# Patient Record
Sex: Female | Born: 1955 | Race: Black or African American | Hispanic: No | Marital: Married | Smoking: Former smoker
Health system: Southern US, Community
[De-identification: ages and names within clinical notes are randomized; demographics above are authoritative.]

## PROBLEM LIST (undated history)

## (undated) DIAGNOSIS — I10 Essential (primary) hypertension: Secondary | ICD-10-CM

## (undated) DIAGNOSIS — K449 Diaphragmatic hernia without obstruction or gangrene: Secondary | ICD-10-CM

## (undated) DIAGNOSIS — J989 Respiratory disorder, unspecified: Secondary | ICD-10-CM

## (undated) DIAGNOSIS — J9600 Acute respiratory failure, unspecified whether with hypoxia or hypercapnia: Secondary | ICD-10-CM

## (undated) DIAGNOSIS — K219 Gastro-esophageal reflux disease without esophagitis: Secondary | ICD-10-CM

## (undated) DIAGNOSIS — E785 Hyperlipidemia, unspecified: Secondary | ICD-10-CM

## (undated) DIAGNOSIS — Z9911 Dependence on respirator [ventilator] status: Secondary | ICD-10-CM

## (undated) DIAGNOSIS — J449 Chronic obstructive pulmonary disease, unspecified: Secondary | ICD-10-CM

## (undated) DIAGNOSIS — R0602 Shortness of breath: Secondary | ICD-10-CM

## (undated) HISTORY — PX: CHOLECYSTECTOMY: SHX55

## (undated) HISTORY — PX: TRACHEOSTOMY: SUR1362

---

## 2005-11-14 ENCOUNTER — Ambulatory Visit: Payer: Self-pay | Admitting: Cardiology

## 2006-11-13 ENCOUNTER — Encounter: Payer: Self-pay | Admitting: Cardiology

## 2006-12-02 ENCOUNTER — Ambulatory Visit: Payer: Self-pay | Admitting: Cardiology

## 2006-12-30 ENCOUNTER — Encounter: Payer: Self-pay | Admitting: Cardiology

## 2007-01-02 ENCOUNTER — Ambulatory Visit: Payer: Self-pay | Admitting: Cardiology

## 2007-02-26 ENCOUNTER — Encounter: Payer: Self-pay | Admitting: Cardiology

## 2007-03-05 ENCOUNTER — Ambulatory Visit: Payer: Self-pay | Admitting: Cardiology

## 2009-06-22 ENCOUNTER — Encounter (INDEPENDENT_AMBULATORY_CARE_PROVIDER_SITE_OTHER): Payer: Self-pay | Admitting: *Deleted

## 2009-06-22 DIAGNOSIS — R079 Chest pain, unspecified: Secondary | ICD-10-CM

## 2009-09-11 ENCOUNTER — Encounter: Payer: Self-pay | Admitting: Cardiology

## 2011-03-12 NOTE — Assessment & Plan Note (Signed)
[facility]                          [facility]   WANONA, STARE                      MRN:          [id number]  DATE:03/05/2007                            DOB:          1956-02-10    REFERRING PHYSICIAN:  Dr. Linna Darner   HISTORY OF PRESENT ILLNESS:  The patient is a 55 year old African  American female, employee at [employer].  The patient has a prior  history of substernal chest pain which has been felt to be related to a  knife.  She felt that her chest pain was atypical.  The patient states  that since she switched to Fosamax, she had one single more episodes of  chest pain.  She took nitroglycerin, but this did not improve her  symptoms.  Mylanta, however, did.  She talked to Dr. Linna Darner about this,  and they both felt that the patient has noncardiac chest pain.  She has  actually done quite well at this point in time. We also started her on  Simvastatin 20 mg daily, and her LDL was now down to 75.  Her HDL was  100 and 19.  Total cholesterol was 212.   MEDICATIONS:  1. Spiriva inhaler.  2. Advair 5/50 one puff b.i.d.  3. Protonix 40 mg daily.  4. Actonel 35 mg p.o. weekly.  5. Valtrex 500 mg daily.  6. Cortisone shots.  7. Calcium.  8. Aspirin 325.  9. Simvastatin 20 mg p.o. q.h.s.   PHYSICAL EXAMINATION:  VITAL SIGNS:  Blood pressure 114/77, heart rate  102 beats per minute.  NECK:  Normal carotid upstroke.  No carotid bruits.  LUNGS:  Clear.  HEART:  Regular rate and rhythm.  EXTREMITIES:  No edema.   PROBLEMS:  1. Atypical chest pain after use of Boniva.  2. Abnormal cardiology study with basilar inferior defect.  3. Mild LV dysfunction with ejection fraction of 49% by Cardiolite and      echocardiogram.  4. Asthma/chronic obstructive pulmonary disease.  5. Gastroesophageal reflux disease.   PLAN:  1. The patient has had no symptoms consistent with angina.  She will      follow up with [place] on a p.r.n.  basis.  No further cardiac workup      appears to be indicated currently.  2. We did cut the patient's Simvastatin to 10 mg p.o. daily given her      excellent lipid profile panel.  3. The patient was also given, during the last visit, p.r.n.      nitroglycerin which she will keep on hand.  She will also continue      on aspirin 325 mg p.o. daily.     Learta Codding, MD,FACC     GED/MedQ  DD: 03/05/2007  DT: 03/05/2007  Job #: [id number]   cc:   Dr. Linna Darner

## 2011-03-15 NOTE — Assessment & Plan Note (Signed)
[facility]                          [facility]   PAISLYNN, HEGSTROM                      MRN:          [id number]  DATE:01/02/2007                            DOB:          10-14-1956    REFERRING PHYSICIAN:  Linna Darner, M.D.   HISTORY OF PRESENT ILLNESS:  The patient is a 55 year old African  American female who presented to the emergency room on November 13, 2006  with substernal chest pain.  The patient reports that she had taken  Boniva a couple of days ago.  A couple of hours after she had took the  dose she developed substernal chest pain.  The pain seemed to be more in  the epigastric radiating up into the chest.  She felt that the entire  chest area was hurting.  The patient apparently was rather anxious at  that time.  She was ruled out for myocardial infarction.  She has  stopped since taking Boniva.  She denies any exertional chest pain or  exertional shortness of breath.  She has no orthopnea, PND.  She does  report a history of asthma.  The patient is rather active.  Unfortunately, she does have risk factors for coronary artery disease.  In particular, the patient was a former smoker but stopped smoking  several years ago in 2006.   Following the evaluation for chest pain, the patient was referred for  Cardiolite stress as well as an echocardiographic study.  Ejection  fraction demonstrated an ejection fraction of 45-50% with mild global  hypokinesis in the posterior wall that was hypokinetic.  A Cardiolite  study was consistent with the findings on echocardiogram with an  ejection fraction of 49% and a basilar inferior defect that was  nonreversible.  There was, however, no definite ischemia seen.   ALLERGIES:  1. CODEINE.  2. MORPHINE.   MEDICATIONS:  1. Spiriva inhaler daily.  2. Advair 500/50 1 puff b.i.d.  3. Protonix 40 mg daily.  4. Actonel 35 mg p.o. daily.  5. Valtrex 500 mg p.o. daily.  6. Cortisone.  7. Calcium 500 mg p.o. b.i.d.   SOCIAL HISTORY:  The patient was a former smoker.  She does not drink  alcohol.  She is an International aid/development worker at clerical job.   PAST SURGICAL HISTORY:  Cholecystectomy.   FAMILY HISTORY:  Mother had a history of heart disease and has been  deceased.  Father had throat cancer.   REVIEW OF SYSTEMS:  As per HPI.  No fever or chills.  No cough or  congestion.  Occasional palpitations and complaints of pain in the lower  extremities.  No heartburn or bloating.  No dysuria or frequency.  No  cramps.  No weakness or tremor.  Positive for anxiety.  No melena,  hematochezia.  No dysuria, frequency.  The remainder of the review of  systems is negative.   PHYSICAL EXAMINATION:  VITAL SIGNS:  Blood pressure is 120/77, heart  rate is 95 beats per minute, weight is 153 pounds.  NECK EXAM:  Normal carotid upstrokes.  No carotid bruits.  LUNGS:  Clear  breath sounds bilaterally.  HEART:  Regular rate and rhythm.  Normal S1-S2.  No murmurs, rubs or  gallops.  ABDOMEN:  Soft.  EXTREMITIES:  No clubbing, cyanosis, or edema.  NEUROLOGICAL:  The patient is alert, oriented and grossly nonfocal.   PROBLEM LIST:  1. Atypical chest pain after use of Boniva.  2. Ruled out for myocardial infarction on November 13, 2006.  3. Abnormal Cardiolite study with basilar inferior defect.  4. Mild aortic dysfunction with ejection fraction of 49%.  5. Asthma/chronic obstructive pulmonary disease.  6. Gastroesophageal reflux disease.   PLAN:  1. The patient's chest pain is atypical.  She has no real exertional      symptoms.  2. The patient, given her risk factor profile, likely has coronary      artery disease and based also on her Cardiolite stress study.      However, she has no definite ischemia.  3. I have given the patient the options to proceed with cardiac      catheterization, although I think she is currently at no risk due      to absence of symptoms.  The patient at this  point wants to defer      and I told her that we will continue to treat her medically.      Initially, she will need to take an aspirin a day.  4. The patient will follow up with me in the future to make sure that      she does not have any recurrent symptoms at which time      catheterization may be performed.     Learta Codding, MD,FACC  Electronically Signed    GED/MedQ  DD: 01/04/2007  DT: 01/04/2007  Job #: [id number]   cc:   Linna Darner, M.D.

## 2013-03-28 ENCOUNTER — Encounter (INDEPENDENT_AMBULATORY_CARE_PROVIDER_SITE_OTHER): Payer: Self-pay | Admitting: Internal Medicine

## 2013-03-29 NOTE — Telephone Encounter (Signed)
This encounter was created in error - please disregard.

## 2014-05-28 ENCOUNTER — Inpatient Hospital Stay (HOSPITAL_COMMUNITY): Payer: BC Managed Care – PPO

## 2014-05-28 ENCOUNTER — Inpatient Hospital Stay (HOSPITAL_COMMUNITY)
Admission: AD | Admit: 2014-05-28 | Discharge: 2014-06-03 | DRG: 208 | Disposition: A | Discharge status: Skilled Nursing Facility | Payer: BC Managed Care – PPO | Source: Other Acute Inpatient Hospital | Attending: Internal Medicine | Admitting: Internal Medicine

## 2014-05-28 DIAGNOSIS — I10 Essential (primary) hypertension: Secondary | ICD-10-CM | POA: Diagnosis present

## 2014-05-28 DIAGNOSIS — Z87891 Personal history of nicotine dependence: Secondary | ICD-10-CM

## 2014-05-28 DIAGNOSIS — R0689 Other abnormalities of breathing: Secondary | ICD-10-CM

## 2014-05-28 DIAGNOSIS — J449 Chronic obstructive pulmonary disease, unspecified: Secondary | ICD-10-CM

## 2014-05-28 DIAGNOSIS — J441 Chronic obstructive pulmonary disease with (acute) exacerbation: Principal | ICD-10-CM | POA: Diagnosis present

## 2014-05-28 DIAGNOSIS — Z9981 Dependence on supplemental oxygen: Secondary | ICD-10-CM

## 2014-05-28 DIAGNOSIS — G4733 Obstructive sleep apnea (adult) (pediatric): Secondary | ICD-10-CM | POA: Diagnosis present

## 2014-05-28 DIAGNOSIS — IMO0002 Reserved for concepts with insufficient information to code with codable children: Secondary | ICD-10-CM

## 2014-05-28 DIAGNOSIS — M810 Age-related osteoporosis without current pathological fracture: Secondary | ICD-10-CM | POA: Diagnosis present

## 2014-05-28 DIAGNOSIS — R7309 Other abnormal glucose: Secondary | ICD-10-CM | POA: Diagnosis present

## 2014-05-28 DIAGNOSIS — E43 Unspecified severe protein-calorie malnutrition: Secondary | ICD-10-CM

## 2014-05-28 DIAGNOSIS — R079 Chest pain, unspecified: Secondary | ICD-10-CM

## 2014-05-28 DIAGNOSIS — E874 Mixed disorder of acid-base balance: Secondary | ICD-10-CM | POA: Diagnosis present

## 2014-05-28 DIAGNOSIS — R0989 Other specified symptoms and signs involving the circulatory and respiratory systems: Secondary | ICD-10-CM

## 2014-05-28 DIAGNOSIS — R627 Adult failure to thrive: Secondary | ICD-10-CM | POA: Diagnosis present

## 2014-05-28 DIAGNOSIS — D649 Anemia, unspecified: Secondary | ICD-10-CM | POA: Diagnosis present

## 2014-05-28 DIAGNOSIS — T380X5A Adverse effect of glucocorticoids and synthetic analogues, initial encounter: Secondary | ICD-10-CM | POA: Diagnosis present

## 2014-05-28 DIAGNOSIS — F411 Generalized anxiety disorder: Secondary | ICD-10-CM | POA: Diagnosis present

## 2014-05-28 DIAGNOSIS — J9620 Acute and chronic respiratory failure, unspecified whether with hypoxia or hypercapnia: Secondary | ICD-10-CM | POA: Diagnosis present

## 2014-05-28 DIAGNOSIS — K449 Diaphragmatic hernia without obstruction or gangrene: Secondary | ICD-10-CM

## 2014-05-28 DIAGNOSIS — J9622 Acute and chronic respiratory failure with hypercapnia: Secondary | ICD-10-CM

## 2014-05-28 DIAGNOSIS — R0609 Other forms of dyspnea: Secondary | ICD-10-CM

## 2014-05-28 LAB — URINALYSIS, ROUTINE W REFLEX MICROSCOPIC
BILIRUBIN URINE: NEGATIVE
GLUCOSE, UA: NEGATIVE mg/dL
HGB URINE DIPSTICK: NEGATIVE
Ketones, ur: NEGATIVE mg/dL
Leukocytes, UA: NEGATIVE
Nitrite: NEGATIVE
PH: 7.5 (ref 5.0–8.0)
Protein, ur: NEGATIVE mg/dL
SPECIFIC GRAVITY, URINE: 1.016 (ref 1.005–1.030)
Urobilinogen, UA: 0.2 mg/dL (ref 0.0–1.0)

## 2014-05-28 LAB — COMPREHENSIVE METABOLIC PANEL
ALT: 45 U/L — ABNORMAL HIGH (ref 0–35)
ANION GAP: 5 (ref 5–15)
AST: 27 U/L (ref 0–37)
Albumin: 2.8 g/dL — ABNORMAL LOW (ref 3.5–5.2)
Alkaline Phosphatase: 48 U/L (ref 39–117)
BILIRUBIN TOTAL: 0.4 mg/dL (ref 0.3–1.2)
BUN: 15 mg/dL (ref 6–23)
CALCIUM: 8.5 mg/dL (ref 8.4–10.5)
CO2: 32 mEq/L (ref 19–32)
Chloride: 102 mEq/L (ref 96–112)
Creatinine, Ser: 0.48 mg/dL — ABNORMAL LOW (ref 0.50–1.10)
GFR calc Af Amer: >90 mL/min (ref >90)
GFR calc non Af Amer: >90 mL/min (ref >90)
Glucose, Bld: 127 mg/dL — ABNORMAL HIGH (ref 70–99)
Potassium: 4.2 mEq/L (ref 3.7–5.3)
Sodium: 139 mEq/L (ref 137–147)
Total Protein: 5.2 g/dL — ABNORMAL LOW (ref 6.0–8.3)

## 2014-05-28 LAB — POCT I-STAT 3, ART BLOOD GAS (G3+)
Acid-Base Excess: 7 mmol/L — ABNORMAL HIGH (ref 0.0–2.0)
Bicarbonate: 31.8 mEq/L — ABNORMAL HIGH (ref 20.0–24.0)
O2 Saturation: 99 %
PH ART: 7.453 — AB (ref 7.350–7.450)
Patient temperature: 98.9
TCO2: 33 mmol/L (ref 0–100)
pCO2 arterial: 45.5 mmHg — ABNORMAL HIGH (ref 35.0–45.0)
pO2, Arterial: 150 mmHg — ABNORMAL HIGH (ref 80.0–100.0)

## 2014-05-28 LAB — CORTISOL: Cortisol, Plasma: 5.4 ug/dL

## 2014-05-28 LAB — PHOSPHORUS: Phosphorus: 0.8 mg/dL — CL (ref 2.3–4.6)

## 2014-05-28 LAB — CBC
HCT: 35.2 % — ABNORMAL LOW (ref 36.0–46.0)
HEMOGLOBIN: 10.1 g/dL — AB (ref 12.0–15.0)
MCH: 29.8 pg (ref 26.0–34.0)
MCHC: 28.7 g/dL — AB (ref 30.0–36.0)
MCV: 103.8 fL — ABNORMAL HIGH (ref 78.0–100.0)
Platelets: 269 10*3/uL (ref 150–400)
RBC: 3.39 MIL/uL — ABNORMAL LOW (ref 3.87–5.11)
RDW: 14.3 % (ref 11.5–15.5)
WBC: 17.1 10*3/uL — ABNORMAL HIGH (ref 4.0–10.5)

## 2014-05-28 LAB — PROTIME-INR
INR: 1.04 (ref 0.00–1.49)
Prothrombin Time: 13.6 seconds (ref 11.6–15.2)

## 2014-05-28 LAB — LACTIC ACID, PLASMA: Lactic Acid, Venous: 2.5 mmol/L — ABNORMAL HIGH (ref 0.5–2.2)

## 2014-05-28 LAB — PROCALCITONIN

## 2014-05-28 LAB — MRSA PCR SCREENING: MRSA by PCR: NEGATIVE

## 2014-05-28 LAB — APTT: aPTT: 21 seconds — ABNORMAL LOW (ref 24–37)

## 2014-05-28 LAB — STREP PNEUMONIAE URINARY ANTIGEN: Strep Pneumo Urinary Antigen: NEGATIVE

## 2014-05-28 LAB — GLUCOSE, CAPILLARY: GLUCOSE-CAPILLARY: 112 mg/dL — AB (ref 70–99)

## 2014-05-28 LAB — MAGNESIUM: MAGNESIUM: 2.3 mg/dL (ref 1.5–2.5)

## 2014-05-28 MED ORDER — IPRATROPIUM BROMIDE 0.02 % IN SOLN
0.5000 mg | Freq: Four times a day (QID) | RESPIRATORY_TRACT | Status: DC
  Administered 2014-05-28 – 2014-05-29 (×3): 0.5 mg via RESPIRATORY_TRACT
  Filled 2014-05-28 (×3): qty 2.5

## 2014-05-28 MED ORDER — PANTOPRAZOLE SODIUM 40 MG PO PACK
40.0000 mg | PACK | Freq: Every day | ORAL | Status: DC
  Administered 2014-05-28 – 2014-05-31 (×4): 40 mg
  Filled 2014-05-28 (×5): qty 20

## 2014-05-28 MED ORDER — JEVITY 1.2 CAL PO LIQD
1000.0000 mL | ORAL | Status: DC
  Administered 2014-05-28: 1000 mL
  Filled 2014-05-28 (×2): qty 1000

## 2014-05-28 MED ORDER — LEVALBUTEROL HCL 0.63 MG/3ML IN NEBU
0.6300 mg | INHALATION_SOLUTION | Freq: Four times a day (QID) | RESPIRATORY_TRACT | Status: DC
  Administered 2014-05-28 – 2014-06-01 (×15): 0.63 mg via RESPIRATORY_TRACT
  Filled 2014-05-28 (×31): qty 3

## 2014-05-28 MED ORDER — PROPOFOL 10 MG/ML IV EMUL: 5.0000 ug/kg/min | INTRAVENOUS | Status: DC

## 2014-05-28 MED ORDER — CETYLPYRIDINIUM CHLORIDE 0.05 % MT LIQD
7.0000 mL | Freq: Four times a day (QID) | OROMUCOSAL | Status: DC
  Administered 2014-05-28 – 2014-05-29 (×4): 7 mL via OROMUCOSAL

## 2014-05-28 MED ORDER — FENTANYL CITRATE 0.05 MG/ML IJ SOLN
25.0000 ug | INTRAMUSCULAR | Status: DC | PRN
  Administered 2014-05-28 – 2014-05-30 (×3): 100 ug via INTRAVENOUS
  Filled 2014-05-28 (×3): qty 2

## 2014-05-28 MED ORDER — HEPARIN SODIUM (PORCINE) 5000 UNIT/ML IJ SOLN
5000.0000 [IU] | Freq: Three times a day (TID) | INTRAMUSCULAR | Status: DC
  Administered 2014-05-28 – 2014-06-03 (×16): 5000 [IU] via SUBCUTANEOUS
  Filled 2014-05-28 (×20): qty 1

## 2014-05-28 MED ORDER — METHYLPREDNISOLONE SODIUM SUCC 125 MG IJ SOLR
60.0000 mg | Freq: Three times a day (TID) | INTRAMUSCULAR | Status: DC
  Administered 2014-05-28 – 2014-05-30 (×6): 60 mg via INTRAVENOUS
  Filled 2014-05-28 (×9): qty 0.96

## 2014-05-28 MED ORDER — "THROMBI-PAD 3""X3"" EX PADS"
1.0000 | MEDICATED_PAD | Freq: Once | CUTANEOUS | Status: DC
  Filled 2014-05-28: qty 1

## 2014-05-28 MED ORDER — PANTOPRAZOLE SODIUM 40 MG IV SOLR
40.0000 mg | Freq: Every day | INTRAVENOUS | Status: DC
  Filled 2014-05-28: qty 40

## 2014-05-28 MED ORDER — SODIUM CHLORIDE 0.9 % IV SOLN
INTRAVENOUS | Status: DC
  Administered 2014-05-28: 15:00:00 via INTRAVENOUS

## 2014-05-28 MED ORDER — CHLORHEXIDINE GLUCONATE 0.12 % MT SOLN
15.0000 mL | Freq: Two times a day (BID) | OROMUCOSAL | Status: DC
Start: 2014-05-28 — End: 2014-05-31
  Administered 2014-05-28 – 2014-05-31 (×6): 15 mL via OROMUCOSAL
  Filled 2014-05-28 (×6): qty 15

## 2014-05-28 MED ORDER — K PHOS MONO-SOD PHOS DI & MONO 155-852-130 MG PO TABS
500.0000 mg | ORAL_TABLET | Freq: Three times a day (TID) | ORAL | Status: AC
  Administered 2014-05-28 – 2014-05-30 (×6): 500 mg via ORAL
  Filled 2014-05-28 (×6): qty 2

## 2014-05-28 NOTE — Progress Notes (Signed)
UR Completed.  Ketty Bitton Jane [phone] 05/28/2014  

## 2014-05-28 NOTE — H&P (Signed)
PULMONARY / CRITICAL CARE MEDICINE   Name: Elizabeth [name] MRN: [id number] DOB: [birth year]    ADMISSION DATE:  05/28/2014   REFERRING MD : [facility](Dr. Orson Aloe is her pulmonary MD.)  CHIEF COMPLAINT:  FTT with es copd  INITIAL PRESENTATION: Sedated on full mvs without distress.  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 Admitted and proved refractory to NIMVS, steroids, abx and at request of family was transferred to [facility] hospital. She arrives intubated , sedated and in no acute distress. PCCM will admit to our service and she may need tracheostomy and LTAC vs palliation if she proved refractory to current interventions. Pulm - Orson Aloe, was on BiPAP at home  X 3y, pressure decreased to 14/9 since could not tolerate high pressure  PAST MEDICAL HISTORY :  ESCOPD HTN Osteoporosis Hiatal herina   Prior to Admission medications   reviewed   Allergies  Allergen Reactions  . Codeine   . Morphine     FAMILY HISTORY:  REVIEWED SOCIAL HISTORY: Quit smoking 7 years ago. Married  REVIEW OF SYSTEMS:  Unable to obatin  SUBJECTIVE:   VITAL SIGNS:   HEMODYNAMICS:   VENTILATOR SETTINGS:   INTAKE / OUTPUT: No intake or output data in the 24 hours ending 05/28/14 1355  PHYSICAL EXAMINATION: General:  WF sedated on vent Neuro:  Sedated with diprivan. No arousal to voice HEENT:  OTT-> vent Cardiovascular:  HSR RRR Lungs: Decreased bs BL, peak pr 39 on Tv 550, plat 24 Abdomen:  Soft NT + bs Musculoskeletal:  Intact Skin:  Warm and without edema  LABS:  CBC No results found for this basename: WBC, HGB, HCT, PLT,  in the last 168 hours Coag's No results found for this basename: APTT, INR,  in the last 168 hours BMET No results found for this  basename: NA, K, CL, CO2, BUN, CREATININE, GLUCOSE,  in the last 168 hours Electrolytes No results found for this basename: CALCIUM, MG, PHOS,  in the last 168 hours Sepsis Markers No results found for this basename: LATICACIDVEN, PROCALCITON, O2SATVEN,  in the last 168 hours ABG No results found for this basename: PHART, PCO2ART, PO2ART,  in the last 168 hours Liver Enzymes No results found for this basename: AST, ALT, ALKPHOS, BILITOT, ALBUMIN,  in the last 168 hours Cardiac Enzymes No results found for this basename: TROPONINI, PROBNP,  in the last 168 hours Glucose No results found for this basename: GLUCAP,  in the last 168 hours  Imaging No results found.   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 P:   Vent bundle Check CxR, ABG and adjust vent as needed She may need EOL discussion vs tracheostomy and Ltac  Solumedrol 60 q 8h Hold abx for now BD's as needed  CARDIOVASCULAR CVL A:  HTN P:  Antihypertensives as needed  RENAL A:   Metabolic acidosis secondary to treatment of resp acidosis P:   Check abg and adjust as needed   GASTROINTESTINAL A:   HH GI protection P:   PPI Start Tfs  HEMATOLOGIC A:   No acute process P:    INFECTIOUS A:  Presumed pulmonary infection without clinical support P:   BCx2 8/1>> UC 8/1>> Sputum 8/1>> Abx: , start date, day / Hold abx for now   ENDOCRINE A:   Hyperglycemia from presumed steroid use  P:   SSI  NEUROLOGIC A:   Sedated on vent and with AMS prior to [facility] with hypercarbia. On diprivan drip. P:   RASS goal: -1 Propofol gtt, fent prn WUA daily   TODAY'S SUMMARY:  End stage COPD [facility] for hypercarbic respiratory failure with intubation. Will assess weaning potential over next 2-3 days   Brett CanalesSteve Minor  ACNP [facility] Pager [phone] till 3 pm If no answer page [phone] 05/28/2014, 2:20 PM  I have personally obtained a history, examined the patient, evaluated laboratory and imaging results, formulated the assessment and plan and placed orders. CRITICAL CARE: The patient is critically ill with multiple organ systems failure and requires high complexity decision making for assessment and support, frequent evaluation and titration of therapies, application of advanced monitoring technologies and extensive interpretation of multiple databases. Critical Care Time devoted to patient care services described in this note is 50 minutes.    Cyril Mourningakesh Alva MD. Tonny BollmanFCCP. [place] [facility] Pager [phone] If no response call [phone]    05/28/2014, 1:55 PM

## 2014-05-28 NOTE — Progress Notes (Signed)
eLink Physician-Brief Progress Note Patient Name: Elizabeth [name] DOB: 02-17-1956 MRN: [id number]  Date of Service  05/28/2014   HPI/Events of Note   Low phosphorous  eICU Interventions  KPhos replacement orally tid x2 days now Monitor Phos   Intervention Category Minor Interventions: Electrolytes abnormality - evaluation and management  MCQUAID, DOUGLAS 05/28/2014, 4:43 PM

## 2014-05-29 LAB — CBC
HCT: 30.6 % — ABNORMAL LOW (ref 36.0–46.0)
Hemoglobin: 9 g/dL — ABNORMAL LOW (ref 12.0–15.0)
MCH: 28.8 pg (ref 26.0–34.0)
MCHC: 29.4 g/dL — AB (ref 30.0–36.0)
MCV: 97.8 fL (ref 78.0–100.0)
Platelets: 310 10*3/uL (ref 150–400)
RBC: 3.13 MIL/uL — ABNORMAL LOW (ref 3.87–5.11)
RDW: 14.7 % (ref 11.5–15.5)
WBC: 13.9 10*3/uL — AB (ref 4.0–10.5)

## 2014-05-29 LAB — BASIC METABOLIC PANEL
Anion gap: 10 (ref 5–15)
BUN: 19 mg/dL (ref 6–23)
CHLORIDE: 105 meq/L (ref 96–112)
CO2: 27 mEq/L (ref 19–32)
CREATININE: 0.47 mg/dL — AB (ref 0.50–1.10)
Calcium: 8.2 mg/dL — ABNORMAL LOW (ref 8.4–10.5)
Glucose, Bld: 149 mg/dL — ABNORMAL HIGH (ref 70–99)
Potassium: 4.1 mEq/L (ref 3.7–5.3)
Sodium: 142 mEq/L (ref 137–147)

## 2014-05-29 LAB — URINE CULTURE
CULTURE: NO GROWTH
Colony Count: NO GROWTH

## 2014-05-29 LAB — BLOOD GAS, ARTERIAL
Acid-Base Excess: 5.8 mmol/L — ABNORMAL HIGH (ref 0.0–2.0)
BICARBONATE: 29.5 meq/L — AB (ref 20.0–24.0)
Drawn by: 36989
FIO2: 0.6 %
MECHVT: 550 mL
O2 Saturation: 99.7 %
PATIENT TEMPERATURE: 98.6
PEEP: 5 cmH2O
PH ART: 7.473 — AB (ref 7.350–7.450)
PO2 ART: 164 mmHg — AB (ref 80.0–100.0)
RATE: 10 resp/min
TCO2: 30.8 mmol/L (ref 0–100)
pCO2 arterial: 40.8 mmHg (ref 35.0–45.0)

## 2014-05-29 LAB — PHOSPHORUS: PHOSPHORUS: 1.7 mg/dL — AB (ref 2.3–4.6)

## 2014-05-29 MED ORDER — CETYLPYRIDINIUM CHLORIDE 0.05 % MT LIQD
7.0000 mL | Freq: Four times a day (QID) | OROMUCOSAL | Status: DC
  Administered 2014-05-29 – 2014-05-31 (×7): 7 mL via OROMUCOSAL

## 2014-05-29 MED ORDER — IPRATROPIUM BROMIDE 0.02 % IN SOLN
0.5000 mg | Freq: Four times a day (QID) | RESPIRATORY_TRACT | Status: AC
  Administered 2014-05-29 – 2014-06-01 (×14): 0.5 mg via RESPIRATORY_TRACT
  Filled 2014-05-29 (×15): qty 2.5

## 2014-05-29 MED ORDER — JEVITY 1.2 CAL PO LIQD
1000.0000 mL | ORAL | Status: DC
  Administered 2014-05-30: 1000 mL
  Filled 2014-05-29 (×3): qty 1000

## 2014-05-29 NOTE — Progress Notes (Signed)
ABG    Component Value Date/Time   PHART 7.473* 05/29/2014 0438   PCO2ART 40.8 05/29/2014 0438   PO2ART 164.0* 05/29/2014 0438   HCO3 29.5* 05/29/2014 0438   TCO2 30.8 05/29/2014 0438   O2SAT 99.7 05/29/2014 0438

## 2014-05-29 NOTE — Progress Notes (Signed)
PULMONARY / CRITICAL CARE MEDICINE   Name: Elizabeth [name] MRN: [id number] DOB: 1956-01-30    ADMISSION DATE:  05/28/2014   REFERRING MD : [facility](Dr. 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 Admitted and proved refractory to NIMVS, steroids, abx and at request of family was transferred to [facility] hospital. She arrives intubated , sedated and in no acute distress. PCCM will admit to our service and she may need tracheostomy and LTAC vs palliation if she proved refractory to current interventions.  Pulm - Orson Aloe, was on BiPAP at home X 3y, pressure decreased to 14/9 since could not tolerate high pressure   STUDIES:   SIGNIFICANT EVENTS: 8/1 [facility]   SUBJECTIVE: denies CP, c/o dyspnea afebrile  VITAL SIGNS: Temp:  [98.6 F (37 C)-100.7 F (38.2 C)] 98.8 F (37.1 C) (08/02 0800) Pulse Rate:  [89-133] 110 (08/02 0945) Resp:  [8-27] 16 (08/02 0945) BP: (82-159)/(56-123) 125/72 mmHg (08/02 0945) SpO2:  [95 %-100 %] 100 % (08/02 0945) FiO2 (%):  [30 %-40 %] 30 % (08/02 0828) Weight:  [65.8 kg (145 lb 1 oz)-66.8 kg (147 lb 4.3 oz)] 66.8 kg (147 lb 4.3 oz) (08/02 0500) HEMODYNAMICS:   VENTILATOR SETTINGS: Vent Mode:  [-] PRVC FiO2 (%):  [30 %-40 %] 30 % Set Rate:  [10 bmp-14 bmp] 10 bmp Vt Set:  [490 mL-550 mL] 550 mL PEEP:  [5 cmH20] 5 cmH20 Plateau Pressure:  [15 cmH20-28 cmH20] 27 cmH20 INTAKE / OUTPUT:  Intake/Output Summary (Last 24 hours) at 05/29/14 1041 Last data filed at 05/29/14 0900  Gross per 24 hour  Intake    624 ml  Output    550 ml  Net     74 ml    PHYSICAL EXAMINATION: General:  WF sedated on vent Neuro:  Awake, alert, non focal HEENT:  OTT-> vent Cardiovascular:  HSR RRR Lungs: Decreased bs BL, peak pr 33  (lower) on Tv 550, plat 24 Abdomen:  Soft NT + bs Musculoskeletal:  Intact Skin:  Warm and without edema  LABS:  CBC  Recent Labs Lab 05/28/14 1520 05/29/14 0218  WBC 17.1* 13.9*  HGB 10.1* 9.0*  HCT 35.2* 30.6*  PLT 269 310   Coag's  Recent Labs Lab 05/28/14 1520  APTT 21*  INR 1.04   BMET  Recent Labs Lab 05/28/14 1520 05/29/14 0218  NA 139 142  K 4.2 4.1  CL 102 105  CO2 32 27  BUN 15 19  CREATININE 0.48* 0.47*  GLUCOSE 127* 149*   Electrolytes  Recent Labs Lab 05/28/14 1520 05/29/14 0218  CALCIUM 8.5 8.2*  MG 2.3  --   PHOS 0.8* 1.7*   Sepsis Markers  Recent Labs Lab 05/28/14 1520  LATICACIDVEN 2.5*  PROCALCITON <0.10   ABG  Recent Labs Lab 05/28/14 1503 05/29/14 0438  PHART 7.453* 7.473*  PCO2ART 45.5* 40.8  PO2ART 150.0* 164.0*   Liver Enzymes  Recent Labs Lab 05/28/14 1520  AST 27  ALT 45*  ALKPHOS 48  BILITOT 0.4  ALBUMIN 2.8*   Cardiac Enzymes No results found for this basename: TROPONINI, PROBNP,  in the last 168 hours Glucose  Recent Labs Lab 05/28/14 1416  GLUCAP 112*    Imaging Dg Chest [facility] 1 [address]  05/28/2014   CLINICAL DATA:  Left-sided neck distension  EXAM: PORTABLE CHEST - 1 VIEW  COMPARISON:  None.  FINDINGS: Cardiac shadow is within normal limits. The lungs are hyperinflated consistent with COPD. An endotracheal tube is noted with the tip approximately 7 cm above the carina in satisfactory position. A nasogastric catheter is noted coiled within the stomach. No focal infiltrate is seen.  IMPRESSION: COPD without acute abnormality.  Tubes and lines as described.   Electronically Signed   By: Alcide CleverMark  Lukens M.D.   On: 05/28/2014 15:04     ASSESSMENT / PLAN:  PULMONARY OETT 7/30>> A: Acute on chronic hypercarbic resp failure in setting og of End Stage COPD that is O2, steroid and NIMVS dependent with frequent failures. P:   SBTs Solumedrol 60 q 8h Hold abx for now BD's as  needed  CARDIOVASCULAR CVL A:  HTN P:  Antihypertensives as needed  RENAL A:   Metabolic alkalosis secondary to treatment of resp acidosis P:   BMET  GASTROINTESTINAL A:   HH GI protection P:   PPI ct Tfs, titrate to goal  HEMATOLOGIC A:   No acute process P:    INFECTIOUS A:   Presumed pulmonary infection without clinical support P:   BCx2 8/1>> UC 8/1>> Sputum 8/1>>  Hold abx for now   ENDOCRINE A:   Hyperglycemia from presumed steroid use  P:   SSI  NEUROLOGIC A:   Sedated on vent and with AMS [facility]. On diprivan drip. P:   RASS goal: -1  fent prn WUA daily Good candidate for PT mobilisation   TODAY'S SUMMARY:  [facility] stage [facility] for hypercarbic respiratory failure with intubation. Will assess weaning potential over next 2-3 days, if no progress ,may have to discuss & plan for Tstomy    I have personally obtained a history, examined the patient, evaluated laboratory and imaging results, formulated the assessment and plan and placed orders. CRITICAL CARE: The patient is critically ill with multiple organ systems failure and requires high complexity decision making for assessment and support, frequent evaluation and titration of therapies, application of advanced monitoring technologies and extensive interpretation of multiple databases. Critical Care Time devoted to patient care services described in this note is 32 minutes.    Cyril Mourningakesh Vignesh Willert MD. Tonny BollmanFCCP. [place] [facility] Pager [phone] If no response call [phone]    05/29/2014, 10:41 AM

## 2014-05-30 ENCOUNTER — Inpatient Hospital Stay (HOSPITAL_COMMUNITY): Payer: BC Managed Care – PPO

## 2014-05-30 ENCOUNTER — Encounter (HOSPITAL_COMMUNITY): Payer: Self-pay | Admitting: Certified Registered Nurse Anesthetist

## 2014-05-30 DIAGNOSIS — E43 Unspecified severe protein-calorie malnutrition: Secondary | ICD-10-CM | POA: Insufficient documentation

## 2014-05-30 LAB — BASIC METABOLIC PANEL
ANION GAP: 10 (ref 5–15)
BUN: 17 mg/dL (ref 6–23)
CHLORIDE: 104 meq/L (ref 96–112)
CO2: 31 mEq/L (ref 19–32)
Calcium: 8.1 mg/dL — ABNORMAL LOW (ref 8.4–10.5)
Creatinine, Ser: 0.37 mg/dL — ABNORMAL LOW (ref 0.50–1.10)
GFR calc Af Amer: >90 mL/min (ref >90)
GFR calc non Af Amer: >90 mL/min (ref >90)
Glucose, Bld: 164 mg/dL — ABNORMAL HIGH (ref 70–99)
Potassium: 3.7 mEq/L (ref 3.7–5.3)
Sodium: 145 mEq/L (ref 137–147)

## 2014-05-30 LAB — CBC
HCT: 29.1 % — ABNORMAL LOW (ref 36.0–46.0)
Hemoglobin: 9 g/dL — ABNORMAL LOW (ref 12.0–15.0)
MCH: 29.1 pg (ref 26.0–34.0)
MCHC: 30.9 g/dL (ref 30.0–36.0)
MCV: 94.2 fL (ref 78.0–100.0)
Platelets: 267 10*3/uL (ref 150–400)
RBC: 3.09 MIL/uL — ABNORMAL LOW (ref 3.87–5.11)
RDW: 15.2 % (ref 11.5–15.5)
WBC: 16.8 10*3/uL — ABNORMAL HIGH (ref 4.0–10.5)

## 2014-05-30 LAB — LEGIONELLA ANTIGEN, URINE: LEGIONELLA ANTIGEN, URINE: NEGATIVE

## 2014-05-30 MED ORDER — PREDNISONE 20 MG PO TABS
40.0000 mg | ORAL_TABLET | Freq: Every day | ORAL | Status: DC
  Administered 2014-06-03: 40 mg
  Filled 2014-05-30 (×2): qty 2

## 2014-05-30 MED ORDER — PREDNISONE 20 MG PO TABS: 30.0000 mg | ORAL_TABLET | Freq: Every day | ORAL | Status: DC

## 2014-05-30 MED ORDER — METOPROLOL TARTRATE 25 MG PO TABS
25.0000 mg | ORAL_TABLET | Freq: Two times a day (BID) | ORAL | Status: DC
  Administered 2014-05-30 – 2014-05-31 (×3): 25 mg via ORAL
  Filled 2014-05-30 (×4): qty 1

## 2014-05-30 MED ORDER — PREDNISONE 10 MG PO TABS
60.0000 mg | ORAL_TABLET | Freq: Every day | ORAL | Status: DC
  Filled 2014-05-30: qty 1

## 2014-05-30 MED ORDER — DEXTROSE 5 % IV SOLN: 500.0000 mg | INTRAVENOUS | Status: DC

## 2014-05-30 MED ORDER — PREDNISONE 20 MG PO TABS: 20.0000 mg | ORAL_TABLET | Freq: Every day | ORAL | Status: DC

## 2014-05-30 MED ORDER — BUDESONIDE 0.25 MG/2ML IN SUSP
0.2500 mg | Freq: Two times a day (BID) | RESPIRATORY_TRACT | Status: AC
  Administered 2014-05-30 – 2014-06-01 (×5): 0.25 mg via RESPIRATORY_TRACT
  Filled 2014-05-30 (×7): qty 2

## 2014-05-30 MED ORDER — ALUM & MAG HYDROXIDE-SIMETH 200-200-20 MG/5ML PO SUSP
30.0000 mL | Freq: Four times a day (QID) | ORAL | Status: DC | PRN
  Administered 2014-05-30: 30 mL
  Filled 2014-05-30: qty 30

## 2014-05-30 MED ORDER — PREDNISONE 10 MG PO TABS: 10.0000 mg | ORAL_TABLET | Freq: Every day | ORAL | Status: DC

## 2014-05-30 MED ORDER — PREDNISONE 50 MG PO TABS
60.0000 mg | ORAL_TABLET | Freq: Every day | ORAL | Status: AC
  Administered 2014-05-31 – 2014-06-02 (×3): 60 mg
  Filled 2014-05-30 (×3): qty 1

## 2014-05-30 MED ORDER — POTASSIUM CHLORIDE 20 MEQ/15ML (10%) PO LIQD
20.0000 meq | ORAL | Status: AC
  Administered 2014-05-30 (×2): 20 meq
  Filled 2014-05-30 (×2): qty 15

## 2014-05-30 MED ORDER — PREDNISONE 20 MG PO TABS: 40.0000 mg | ORAL_TABLET | Freq: Every day | ORAL | Status: DC

## 2014-05-30 MED ORDER — ALUM & MAG HYDROXIDE-SIMETH 200-200-20 MG/5ML PO SUSP
30.0000 mL | Freq: Once | ORAL | Status: AC
  Administered 2014-05-30: 30 mL
  Filled 2014-05-30 (×3): qty 30

## 2014-05-30 MED ORDER — PRO-STAT SUGAR FREE PO LIQD
30.0000 mL | Freq: Two times a day (BID) | ORAL | Status: DC
  Administered 2014-05-30 – 2014-05-31 (×2): 30 mL
  Filled 2014-05-30 (×3): qty 30

## 2014-05-30 MED ORDER — MIDAZOLAM HCL 2 MG/2ML IJ SOLN: 1.0000 mg | INTRAMUSCULAR | Status: DC | PRN

## 2014-05-30 MED ORDER — LEVALBUTEROL HCL 0.63 MG/3ML IN NEBU
0.6300 mg | INHALATION_SOLUTION | RESPIRATORY_TRACT | Status: DC | PRN
  Administered 2014-06-02: 0.63 mg via RESPIRATORY_TRACT

## 2014-05-30 MED ORDER — JEVITY 1.2 CAL PO LIQD
1000.0000 mL | ORAL | Status: DC
  Filled 2014-05-30 (×4): qty 1000

## 2014-05-30 MED ORDER — PREDNISONE 50 MG PO TABS: 60.0000 mg | ORAL_TABLET | Freq: Every day | ORAL | Status: DC

## 2014-05-30 MED ORDER — DEXTROSE 5 % IV SOLN
500.0000 mg | INTRAVENOUS | Status: DC
  Administered 2014-05-31 – 2014-06-02 (×3): 500 mg via INTRAVENOUS
  Filled 2014-05-30 (×4): qty 500

## 2014-05-30 MED FILL — Fentanyl Citrate Inj 0.05 MG/ML: INTRAMUSCULAR | Qty: 2 | Status: AC

## 2014-05-30 NOTE — Progress Notes (Signed)
INITIAL NUTRITION ASSESSMENT  DOCUMENTATION CODES Per approved criteria  -Severe malnutrition in the context of chronic illness   INTERVENTION: 1.  Utilize 72M PEPuP Protocol: On Day 1 of the protocol Increase Jevity 1.2 via OG tube to 45 ml/hr (1080 ml) and run for the remainder of the day. On Day 2 of the protocol continue at goal rate of 45 ml/hr, adjusting rate as needed to ensure pt receives 1080 ml of tube feeding per day.   2.  30 ml Prostat BID  Provides 1496 kcals (102% of needs), 90 gm protein, 875 ml free water daily.  NUTRITION DIAGNOSIS: Inadequate oral intake related to inability to eat as evidenced by NPO status  Goal: Pt to meet >/= 90% of their estimated nutrition needs   Monitor:  TF tolerance, respiratory status, weight trend, labs  Reason for Assessment: MD consult  58 y.o. female  Admitting Dx: Acute on chronic hypercarbic resp failure in setting of End Stage COPD that is O2, steroid and NIMVS dependent with frequent failures.  ASSESSMENT: [facility]. Pt with OG tube in place.  Jevity 1.2 @ 30 ml/hr, provides: 720 ml, 864 kcal, 40 grams protein, and 583 ml H2O.   Pt alert on vent. Pt reports weight has decreased from 144 lb to 133 lb. Pt is on bipap at home but able to eat during the day. Pt c/o heartburn, RN aware. Pt reports appetite as so-so.   Patient is currently intubated on ventilator support MV: 9.8 L/min Temp (24hrs), Avg:98.8 F (37.1 C), Min:98.4 F (36.9 C), Max:99.3 F (37.4 C)  Propofol: none  Nutrition Focused Physical Exam:  Subcutaneous Fat:  Orbital Region: WDL Upper Arm Region: WDL Thoracic and Lumbar Region: WDL  Muscle:  Temple Region: severe depletion  Clavicle Bone Region: severe depletion  Clavicle and Acromion Bone Region: severe depletion  Scapular Bone Region: severe depletion Dorsal Hand: mild/moderate depletion  Patellar Region: severe depletion  Anterior Thigh Region: severe  depletion  Posterior Calf Region: mild/moderate depletion   Edema: not present    Height: Ht Readings from Last 1 Encounters:  05/30/14 5\' 7"  (1.702 m)    Weight: Wt Readings from Last 1 Encounters:  05/30/14 133 lb (60.328 kg)    Ideal Body Weight: 61.3 kg   % Ideal Body Weight: 98%  Wt Readings from Last 10 Encounters:  05/30/14 133 lb (60.328 kg)    Usual Body Weight: 144 lb   % Usual Body Weight: 92%  BMI:  Body mass index is 20.83 kg/(m^2).  Estimated Nutritional Needs: Kcal: 1467 Protein: 90-115 gras Fluid: > 1.5 L/day  Skin: WDL  Diet Order: NPO  EDUCATION NEEDS: -No education needs identified at this time   Intake/Output Summary (Last 24 hours) at 05/30/14 1512 Last data filed at 05/30/14 1400  Gross per 24 hour  Intake    750 ml  Output    740 ml  Net     10 ml    Last BM: none documented   Labs:   Recent Labs Lab 05/28/14 1520 05/29/14 0218 05/30/14 0215  NA 139 142 145  K 4.2 4.1 3.7  CL 102 105 104  CO2 32 27 31  BUN 15 19 17   CREATININE 0.48* 0.47* 0.37*  CALCIUM 8.5 8.2* 8.1*  MG 2.3  --   --   PHOS 0.8* 1.7*  --   GLUCOSE 127* 149* 164*    CBG (last 3)   Recent Labs  05/28/14  1416  GLUCAP 112*    Scheduled Meds: . alum & mag hydroxide-simeth  30 mL Per Tube Once  . antiseptic oral rinse  7 mL Mouth Rinse QID  . [START ON 05/31/2014] azithromycin  500 mg Intravenous Q24H  . budesonide (PULMICORT) nebulizer solution  0.25 mg Nebulization BID  . chlorhexidine  15 mL Mouth Rinse BID  . feeding supplement (JEVITY 1.2 CAL)  1,000 mL Per Tube Q24H  . heparin  5,000 Units Subcutaneous 3 times per day  . ipratropium  0.5 mg Nebulization QID  . levalbuterol  0.63 mg Nebulization 4 times per day  . metoprolol tartrate  25 mg Oral BID  . pantoprazole sodium  40 mg Per Tube Q1200  . [START ON 05/31/2014] predniSONE  60 mg Per Tube Q breakfast   Followed by  . [START ON 06/03/2014] predniSONE  40 mg Per Tube Q breakfast    Followed by  . [START ON 06/06/2014] predniSONE  30 mg Per Tube Q breakfast   Followed by  . [START ON 06/09/2014] predniSONE  20 mg Per Tube Q breakfast   Followed by  . [START ON 06/12/2014] predniSONE  10 mg Per Tube Q breakfast    Continuous Infusions:    History reviewed. No pertinent past medical history.  History reviewed. No pertinent past surgical history.  Kendell Bane RD, LDN, CNSC [phone] Pager [phone] After Hours Pager

## 2014-05-30 NOTE — Progress Notes (Signed)
PULMONARY / CRITICAL CARE MEDICINE   Name: Elizabeth [name] MRN: [id number] DOB: 1955/11/10    ADMISSION DATE:  05/28/2014   REFERRING MD : [facility](Dr. 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 Admitted and proved refractory to NIMVS, steroids, abx and at request of family was transferred to [facility] hospital. She arrives intubated , sedated and in no acute distress. PCCM will admit to our service and she may need tracheostomy and LTAC vs palliation if she proved refractory to current interventions.   Elizabeth [name], was on BiPAP at home X 3y, pressure decreased to 14/9 since could not tolerate high pressure   STUDIES:   SIGNIFICANT EVENTS: 8/01 - Tx to [facility] from [facility] 8/03 - failed vent wean   SUBJECTIVE:  RN reports pt failed SBT 8/5  VITAL SIGNS: Temp:  [98.4 F (36.9 C)-99.3 F (37.4 C)] 98.5 F (36.9 C) (08/03 0820) Pulse Rate:  [89-128] 128 (08/03 1000) Resp:  [9-25] 24 (08/03 1000) BP: (97-153)/(64-107) 153/91 mmHg (08/03 1000) SpO2:  [95 %-100 %] 95 % (08/03 1000) FiO2 (%):  [30 %] 30 % (08/03 0806) Weight:  [144 lb 10 oz (65.6 kg)] 144 lb 10 oz (65.6 kg) (08/03 0500)  HEMODYNAMICS:    VENTILATOR SETTINGS: Vent Mode:  [-] CPAP;PSV FiO2 (%):  [30 %] 30 % Set Rate:  [10 bmp] 10 bmp Vt Set:  [550 mL] 550 mL PEEP:  [5 cmH20] 5 cmH20 Plateau Pressure:  [20 cmH20-24 cmH20] 24 cmH20  INTAKE / OUTPUT:  Intake/Output Summary (Last 24 hours) at 05/30/14 1101 Last data filed at 05/30/14 1000  Gross per 24 hour  Intake    760 ml  Output    725 ml  Net     35 ml    PHYSICAL EXAMINATION: General:  wdwn female in NAD on vent Neuro:  Awake, alert, non focal HEENT:  OTT, mm pink/moist Cardiovascular:  HSR RRR Lungs: Decreased bs  BL Abdomen:  Soft NT + bs Musculoskeletal:  Intact Skin:  Warm and without edema  LABS:  CBC  Recent Labs Lab 05/28/14 1520 05/29/14 0218 05/30/14 0215  WBC 17.1* 13.9* 16.8*  HGB 10.1* 9.0* 9.0*  HCT 35.2* 30.6* 29.1*  PLT 269 310 267   Coag's  Recent Labs Lab 05/28/14 1520  APTT 21*  INR 1.04   BMET  Recent Labs Lab 05/28/14 1520 05/29/14 0218 05/30/14 0215  NA 139 142 145  K 4.2 4.1 3.7  CL 102 105 104  CO2 32 27 31  BUN 15 19 17   CREATININE 0.48* 0.47* 0.37*  GLUCOSE 127* 149* 164*   Electrolytes  Recent Labs Lab 05/28/14 1520 05/29/14 0218 05/30/14 0215  CALCIUM 8.5 8.2* 8.1*  MG 2.3  --   --   PHOS 0.8* 1.7*  --    Sepsis Markers  Recent Labs Lab 05/28/14 1520  LATICACIDVEN 2.5*  PROCALCITON <0.10   ABG  Recent Labs Lab 05/28/14 1503 05/29/14 0438  PHART 7.453* 7.473*  PCO2ART 45.5* 40.8  PO2ART 150.0* 164.0*   Liver Enzymes  Recent Labs Lab 05/28/14 1520  AST 27  ALT 45*  ALKPHOS 48  BILITOT 0.4  ALBUMIN 2.8*   Cardiac Enzymes No results found for this basename: TROPONINI, PROBNP,  in the  last 168 hours Glucose  Recent Labs Lab 05/28/14 1416  GLUCAP 112*    Imaging No results found.   ASSESSMENT / PLAN:  PULMONARY OETT 7/30>> A: Acute on chronic hypercarbic resp failure - in setting of ES-COPD that is O2, steroid & NIMV dependent with frequent failures. P:   SBT as tolerated Change solu-medrol to oral prednisone with taper Azithro for anti-inflammatory effect Pulmicort Scheduled xopenex / atrovent, + PRN xopenex  CARDIOVASCULAR CVL: n/a A:  HTN P:  Resume home lopressor  RENAL A:   No acute issues P:   Trend BMET Replace lytes as indicated  GASTROINTESTINAL A:   GI protection P:   PPI Continue TFs, titrate to goal  HEMATOLOGIC A:   No acute process P:  DVT: SQ Heparin  INFECTIOUS A:   Presumed pulmonary infection without clinical support P:   BCx2 8/1>> Sputum  8/1>>  Azthro for 5 days total   ENDOCRINE A:   Hyperglycemia - from presumed steroid use  P:   SSI  NEUROLOGIC A:   Pain - on vent and with AMS [facility] with [facility]carbia.  P:   RASS goal: 0 PRN fentanyl Good candidate for PT mobilisation   TODAY'S SUMMARY: End stage COPD [facility] for hypercarbic respiratory failure with intubation. Weaning on PSV, continue efforts toward extubation.  Will assess weaning potential over next 2-3 days, if no progress ,may have to discuss & plan for Tstomy  Canary BrimBrandi Ollis, NP-C [facility] Pgr: [phone] or [phone]    I have personally obtained a history, examined the patient, evaluated laboratory and imaging results, formulated the assessment and plan and placed orders.  CRITICAL CARE: The patient is critically ill with multiple organ systems failure and requires high complexity decision making for assessment and support, frequent evaluation and titration of therapies, application of advanced monitoring technologies and extensive interpretation of multiple databases. Critical Care Time devoted to patient care services described in this note is 30 minutes.      05/30/2014, 11:01 AM   Stephanie AcreVishal Tamari Redwine, MD [facility] Pager [phone] On Call Pager [phone]

## 2014-05-30 NOTE — Progress Notes (Signed)
[facility] AD[facility] REPLACEMENT PROTOCOL FOR AM LAB REPLACEMENT ONLY  The patient does apply for the [facility] Adult ICU Electrolyte Replacment Protocol based on the criteria listed below:   1. Is GFR >/= 40 ml/min? Yes.    Patient's GFR today is >90 2. Is urine output >/= 0.5 ml/kg/hr for the last 6 hours? Yes.   Patient's UOP is 0.6 ml/kg/hr 3. Is BUN < 60 mg/dL? Yes.    Patient's BUN today is 17 4. Abnormal electrolyte(s): K+3.7 5. Ordered repletion with: protocol 6. If a panic level lab has been reported, has the CCM MD in charge been notified? No..   Physician:  Boone MasterYacoub  Elizabeth [name] 05/30/2014 4:11 AM

## 2014-05-30 NOTE — Evaluation (Signed)
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 Admitted and proved refractory to NIMVS, steroids, abx and at request of family was [facility]. She arrived  intubated , sedated and was in no acute distress. PCCM  admited and pt  may need tracheostomy and LTAC vs palliation if she proves refractory to current interventions.    Clinical Impression  Pt admitted with respiratory distress. Pt currently with functional limitations due to the deficits listed below (see PT Problem List).  Strength appears fairly good but pt is limited in activity due to poor energy reserve.  Pt will benefit from skilled PT to increase their independence and safety with mobility to allow discharge to the venue listed below.      Follow Up Recommendations LTACH;Supervision/Assistance - 24 hour    Equipment Recommendations  Other (comment) (TBA)    Recommendations for Other Services       Precautions / Restrictions Precautions Precautions: Fall Restrictions Weight Bearing Restrictions: No      Mobility  Bed Mobility Overal bed mobility: Needs Assistance;+2 for physical assistance Bed Mobility: Rolling Rolling: Min assist         General bed mobility comments: Rolled to place pt on bedpan.  Once pt finished and pt rolled to get off bedpan HR up to 127 from 74 with an episode of Vtach on monitor.  Nursing preseent.  Pt stated her chest was burning therefore, terminated PT session.  Pt with very poor respiratory reserve.    Transfers                    Ambulation/Gait                Stairs            Wheelchair Mobility    Modified Rankin (Stroke Patients Only)       Balance                                             Pertinent Vitals/Pain 74-127 bpm HR - nursing aware.  On Vent.  No pain.    Home Living Family/patient expects to be discharged to:: Private residence Living Arrangements: Spouse/significant other Available Help at Discharge: Available PRN/intermittently Type of Home: House Home Access: Stairs to enter   Secretary/administratorntrance Stairs-Number of Steps: 2 Home Layout: One level Home Equipment: None      Prior Function Level of Independence: Independent               Hand Dominance        Extremity/Trunk Assessment   Upper Extremity Assessment: Defer to OT evaluation           Lower Extremity Assessment: Generalized weakness         Communication   Communication: Other (comment) (Ventilator)  Cognition Arousal/Alertness: Awake/alert Behavior During Therapy: WFL for tasks assessed/performed Overall Cognitive Status: Difficult to assess                      General Comments      Exercises General Exercises - Lower Extremity Ankle  Circles/Pumps: AROM;Both;5 reps;Supine Heel Slides: AROM;Both;5 reps;Supine Hip ABduction/ADduction: AROM;Both;5 reps;Supine      Assessment/Plan    PT Assessment Patient needs continued PT services  PT Diagnosis Generalized weakness   PT Problem List Decreased activity tolerance;Decreased balance;Decreased mobility;Decreased knowledge of use of DME;Decreased safety awareness;Decreased knowledge of precautions;Cardiopulmonary status limiting activity  PT Treatment Interventions DME instruction;Gait training;Functional mobility training;Therapeutic activities;Therapeutic exercise;Balance training;Patient/family education   PT Goals (Current goals can be found in the Care Plan section) Acute Rehab PT Goals Patient Stated Goal: to get better PT Goal Formulation: With patient Time For Goal Achievement: 06/13/14 Potential to Achieve Goals: Good    Frequency Min 3X/week   Barriers  to discharge        Co-evaluation               End of Session Equipment Utilized During Treatment: Gait belt;Oxygen (ventilator) Activity Tolerance: Patient limited by fatigue (limited by incr HR and RR with activity) Patient left: in bed;with call bell/phone within reach Nurse Communication: Mobility status;Patient requests pain meds         Time: 1432-1450 PT Time Calculation (min): 18 min   Charges:   PT Evaluation $Initial PT Evaluation Tier I: 1 Procedure PT Treatments $Therapeutic Exercise: 8-22 mins   PT G Codes:          INGOLD,Harshitha Fretz 06-05-14, 4:09 PM [facility] [phone] (pager)

## 2014-05-31 ENCOUNTER — Inpatient Hospital Stay (HOSPITAL_COMMUNITY): Payer: BC Managed Care – PPO

## 2014-05-31 LAB — CULTURE, RESPIRATORY W GRAM STAIN

## 2014-05-31 LAB — BASIC METABOLIC PANEL
Anion gap: 6 (ref 5–15)
BUN: 21 mg/dL (ref 6–23)
CALCIUM: 8.1 mg/dL — AB (ref 8.4–10.5)
CO2: 35 mEq/L — ABNORMAL HIGH (ref 19–32)
Chloride: 101 mEq/L (ref 96–112)
Creatinine, Ser: 0.41 mg/dL — ABNORMAL LOW (ref 0.50–1.10)
GFR calc Af Amer: >90 mL/min (ref >90)
GLUCOSE: 113 mg/dL — AB (ref 70–99)
Potassium: 4.1 mEq/L (ref 3.7–5.3)
SODIUM: 142 meq/L (ref 137–147)

## 2014-05-31 LAB — CULTURE, RESPIRATORY: Special Requests: NORMAL

## 2014-05-31 LAB — CBC
HCT: 28.1 % — ABNORMAL LOW (ref 36.0–46.0)
Hemoglobin: 8.8 g/dL — ABNORMAL LOW (ref 12.0–15.0)
MCH: 29.6 pg (ref 26.0–34.0)
MCHC: 31.3 g/dL (ref 30.0–36.0)
MCV: 94.6 fL (ref 78.0–100.0)
PLATELETS: 261 10*3/uL (ref 150–400)
RBC: 2.97 MIL/uL — ABNORMAL LOW (ref 3.87–5.11)
RDW: 15.2 % (ref 11.5–15.5)
WBC: 15.2 10*3/uL — AB (ref 4.0–10.5)

## 2014-05-31 LAB — GLUCOSE, CAPILLARY
Glucose-Capillary: 122 mg/dL — ABNORMAL HIGH (ref 70–99)
Glucose-Capillary: 169 mg/dL — ABNORMAL HIGH (ref 70–99)
Glucose-Capillary: 92 mg/dL (ref 70–99)
Glucose-Capillary: 77 mg/dL (ref 70–99)

## 2014-05-31 MED ORDER — CETYLPYRIDINIUM CHLORIDE 0.05 % MT LIQD
7.0000 mL | Freq: Two times a day (BID) | OROMUCOSAL | Status: DC
Start: 2014-05-31 — End: 2014-06-03
  Administered 2014-05-31 – 2014-06-03 (×5): 7 mL via OROMUCOSAL

## 2014-05-31 MED ORDER — LORAZEPAM 0.5 MG PO TABS
0.5000 mg | ORAL_TABLET | Freq: Two times a day (BID) | ORAL | Status: DC | PRN
  Administered 2014-05-31 – 2014-06-02 (×3): 0.5 mg via ORAL
  Filled 2014-05-31 (×3): qty 1

## 2014-05-31 MED ORDER — METOPROLOL TARTRATE 1 MG/ML IV SOLN: 5.0000 mg | Freq: Four times a day (QID) | INTRAVENOUS | Status: DC | PRN

## 2014-05-31 MED ORDER — METOPROLOL TARTRATE 12.5 MG HALF TABLET
12.5000 mg | ORAL_TABLET | Freq: Every day | ORAL | Status: DC
  Administered 2014-06-01 – 2014-06-02 (×2): 12.5 mg via ORAL
  Filled 2014-05-31 (×2): qty 1

## 2014-05-31 NOTE — Progress Notes (Signed)
Chaplain responded to spiritual care consult. Patient was sitting up in bed on vent, but alert and responsive using head nods and wipe-on-wipe-off board. She appeared very pleased to see chaplain. Patient said she has a faith community in her hometown near [place], and that her minister/church family have been supportive. She nodded "yes" when asked if family would be visiting her today. She requested prayer. Chaplain provided emotional and spiritual support through pastoral presence, prayer, and active listening. Patient requested a follow up visit tomorrow. Chaplain will follow up on Wednesday. Please page if needed sooner.   Guy Sandifer Irusta [phone]

## 2014-05-31 NOTE — Procedures (Signed)
Extubation Procedure Note  Patient Details:   Name: Elizabeth [name] DOB: [birth year] MRN: [id number]   Airway Documentation:     Evaluation  O2 sats: stable throughout Complications: No apparent complications Patient did tolerate procedure well. Bilateral Breath Sounds: Diminished Suctioning: Oral Yes  RT extubated patient to 4lnc. Patient tolerated well. No complications. Vital signs stable at this time. RN at bedside. RT will continue to monitor.  Ave Filterdkins, Mirela Parsley Williams 05/31/2014, 1:59 PM

## 2014-05-31 NOTE — Progress Notes (Signed)
UR Completed.  Eldred Lievanos Jane [phone] 05/31/2014  

## 2014-05-31 NOTE — Care Management Note (Signed)
    Page 1 of 2   06/03/2014     3:15:50 PM CARE MANAGEMENT NOTE 06/03/2014  Patient:  Dionne BucyDALTON,Aryana W   Account Number:  [id number]  Date Initiated:  05/28/2014  Documentation initiated by:  [facility],SARAH  Subjective/Objective Assessment:   Admitted from outside facility with resp failure - on vent     Action/Plan:   Anticipated DC Date:  06/03/2014   Anticipated DC Plan:  SKILLED NURSING FACILITY  In-house referral  Financial Counselor  Clinical Social Worker      DC Planning Services  CM consult      Choice offered to / List presented to:             Status of service:  Completed, signed off Medicare Important Message given?   (If response is "NO", the following Medicare IM given date fields will be blank) Date Medicare IM given:   Medicare IM given by:   Date Additional Medicare IM given:   Additional Medicare IM given by:    Discharge Disposition:  SKILLED NURSING FACILITY  Per UR Regulation:  Reviewed for med. necessity/level of care/duration of stay  If discussed at Long Length of Stay Meetings, dates discussed:   06/02/2014    Comments:  Contact:  Persad,David Other [phone]  06/03/14 Sidney AceJulie Wendelin Reader, RN, BSN [phone] Pt dc'ing to SNF today, per CSW arrangements.  Financial Counselor Frances MaywoodCindy Rochelle called pt in her room to discuss issues with her bill prior to dc.  06/02/14 Sidney AceJulie Keyatta Tolles, RN, BSN [phone] Pt now states she has changed her mind and wants to go to SNF.  CSW consulted to facilitate dc to SNF when medically stable, likley 06/03/14.  06/02/14 Sidney AceJulie Hibo Blasdell, RN, BSN [phone] CM c/s for assistance with inhaler costs.  Unfortunately, pt is not eligible for patient assistance programs, as she has insurance, and these programs are  for uninsured. Coupons are available, but would only help for a month or so.  Pt states she pays $100 copay for both Spiriva and Symbicort.  Dr Priscella Mannamiswammy at bedside--state she will change to duonebs for more affordability.  We discussed  PT recommendation of SNF at dc, and pt prefers to go home, if possible.  Will follow up after PT visit later today.  Left message for financial counselor to call or visit pt, per her request.  06-01-14 2pm Avie ArenasSarah Brown, RNBSN [phone]- [phone] Now showing has insurance with BCBS.  Is still not eligible for Ltach unless on vent.  Patient is now extubated.  Lives at home with husband who does work.  Gets meds through insurance but still can not afford high dollar ones such as spireva and symbacort.  Has been getting these through office.  Would like assistance at home when husband not home.  [facility], but insurance would not pay for private duty that would be self pay.  States sister can come and help out some.  Has had HH in past and not sure wants them back.   Will check on programs for resp meds and get back with her.  Will continue to follow for further progression.  05-31-14 5:30am   Avie ArenasSarah Brown, RNBSN [phone]- [phone] Patient does not have insurance - No Ltach benefits.  CM will continue to follow for progression.

## 2014-05-31 NOTE — Progress Notes (Signed)
PULMONARY / CRITICAL CARE MEDICINE   Name: Elizabeth [name] MRN: [id number] DOB: 05/24/1956    ADMISSION DATE:  05/28/2014   REFERRING MD : [facility](Dr. 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 Admitted and proved refractory to NIMVS, steroids, abx and at request of family was transferred to [facility] hospital. She arrives intubated , sedated and in no acute distress. PCCM will admit to our service and she may need tracheostomy and LTAC vs palliation if she proved refractory to current interventions.   Pulm - Orson Aloe, was on BiPAP at home X 3y, pressure decreased to 14/9 since could not tolerate high pressure   STUDIES:   SIGNIFICANT EVENTS: 8/01 - Tx to [facility] from [facility] 8/03 - failed vent wean 8/04 - extubated   SUBJECTIVE: Tolerating SBT, altert, no acute complaints.   VITAL SIGNS: Temp:  [98.4 F (36.9 C)-99 F (37.2 C)] 98.7 F (37.1 C) (08/04 0734) Pulse Rate:  [75-115] 89 (08/04 1000) Resp:  [8-22] 14 (08/04 1000) BP: (72-131)/(54-84) 120/76 mmHg (08/04 1000) SpO2:  [92 %-100 %] 92 % (08/04 1000) FiO2 (%):  [30 %] 30 % (08/04 1000) Weight:  [133 lb (60.328 kg)-148 lb 2.4 oz (67.2 kg)] 148 lb 2.4 oz (67.2 kg) (08/04 0500)  HEMODYNAMICS:    VENTILATOR SETTINGS: Vent Mode:  [-] CPAP;PSV FiO2 (%):  [30 %] 30 % Set Rate:  [10 bmp] 10 bmp Vt Set:  [550 mL] 550 mL PEEP:  [5 cmH20] 5 cmH20 Pressure Support:  [12 cmH20] 12 cmH20 Plateau Pressure:  [14 cmH20-24 cmH20] 14 cmH20  INTAKE / OUTPUT:  Intake/Output Summary (Last 24 hours) at 05/31/14 1039 Last data filed at 05/31/14 1000  Gross per 24 hour  Intake 1132.5 ml  Output    461 ml  Net  671.5 ml    PHYSICAL EXAMINATION: General:  wdwn female in NAD on vent Neuro:  Awake, alert, non  focal HEENT:  OTT, mm pink/moist Cardiovascular:  HSR RRR Lungs: Decreased bs BL Abdomen:  Soft NT + bs Musculoskeletal:  Intact Skin:  Warm and without edema  LABS:  CBC  Recent Labs Lab 05/29/14 0218 05/30/14 0215 05/31/14 0250  WBC 13.9* 16.8* 15.2*  HGB 9.0* 9.0* 8.8*  HCT 30.6* 29.1* 28.1*  PLT 310 267 261   Coag's  Recent Labs Lab 05/28/14 1520  APTT 21*  INR 1.04   BMET  Recent Labs Lab 05/29/14 0218 05/30/14 0215 05/31/14 0250  NA 142 145 142  K 4.1 3.7 4.1  CL 105 104 101  CO2 27 31 35*  BUN 19 17 21   CREATININE 0.47* 0.37* 0.41*  GLUCOSE 149* 164* 113*   Electrolytes  Recent Labs Lab 05/28/14 1520 05/29/14 0218 05/30/14 0215 05/31/14 0250  CALCIUM 8.5 8.2* 8.1* 8.1*  MG 2.3  --   --   --   PHOS 0.8* 1.7*  --   --    Sepsis Markers  Recent Labs Lab 05/28/14 1520  LATICACIDVEN 2.5*  PROCALCITON <0.10   ABG  Recent Labs Lab 05/28/14 1503 05/29/14 0438  PHART 7.453* 7.473*  PCO2ART 45.5* 40.8  PO2ART 150.0* 164.0*   Liver Enzymes  Recent Labs Lab 05/28/14 1520  AST 27  ALT 45*  ALKPHOS 48  BILITOT 0.4  ALBUMIN  2.8*   Cardiac Enzymes No results found for this basename: TROPONINI, PROBNP,  in the last 168 hours Glucose  Recent Labs Lab 05/28/14 1416 05/31/14 0732  GLUCAP 112* 122*    Imaging Dg Chest Port 1 View  05/30/2014   CLINICAL DATA:  Pneumonitis  EXAM: PORTABLE CHEST - 1 VIEW  COMPARISON:  05/28/2014  FINDINGS: The lungs are again hyperinflated consistent with COPD. Nasogastric catheter remains coiled within the stomach. A hiatal hernia is again noted. No focal infiltrate or sizable effusion is seen. An endotracheal tube is noted approximately 9 cm above the carina. No bony abnormality is seen.  IMPRESSION: COPD without acute abnormality.   Electronically Signed   By: Alcide CleverMark  Lukens M.D.   On: 05/30/2014 07:11     ASSESSMENT / PLAN:  PULMONARY OETT 7/30>> A: Acute on chronic hypercarbic resp failure -  in setting of ES-COPD that is O2, steroid & NIMV dependent with frequent failures. P:   SBT as tolerated, will plan for extubation this afternoon Change solu-medrol to oral prednisone with taper Azithro for anti-inflammatory effect Pulmicort Scheduled xopenex / atrovent, + PRN xopenex ABG in the AM  CARDIOVASCULAR CVL: n/a A:  HTN P:  Will restart lopressor (12.5mg  PO, and give PRN IV).   RENAL A:   No acute issues P:   Trend BMET Replace lytes as indicated  GASTROINTESTINAL A:   GI protection P:   PPI Continue TFs, titrate to goal  HEMATOLOGIC A:   No acute process P:  DVT: SQ Heparin  INFECTIOUS A:   Presumed pulmonary infection without clinical support P:   BCx2 8/1>> Sputum 8/1>>  Azthro for 5 days total   ENDOCRINE A:   Hyperglycemia - from presumed steroid use  P:   SSI  NEUROLOGIC A:   Pain - on vent and with AMS prior [facility].  P:   RASS goal: 0 PRN fentanyl Good candidate for PT mobilisation   TODAY'S SUMMARY: End stage COPD [facility] for hypercarbic respiratory failure with intubation. Weaning on PSV, continue efforts toward extubation.  Extubated on 8/4 afternoon   I have personally obtained a history, examined the patient, evaluated laboratory and imaging results, formulated the assessment and plan and placed orders.  CRITICAL CARE: The patient is critically ill with multiple organ systems failure and requires high complexity decision making for assessment and support, frequent evaluation and titration of therapies, application of advanced monitoring technologies and extensive interpretation of multiple databases. Critical Care Time devoted to patient care services described in this note is 30 minutes.      05/31/2014, 10:39 AM   Stephanie AcreVishal Lear Carstens, MD [facility] Pager [phone] On Call Pager [phone]

## 2014-05-31 NOTE — Progress Notes (Signed)
Physical Therapy Treatment Patient Details Name: Elizabeth [name] MRN: [id number] DOB: Jun 18, 1956 Today's Date: 05/31/2014    History of Present Illness pt presents with End Stage COPD with VDRF.      PT Comments    Pt able to tolerate placing bed in chair position at 60 degrees today.  Per RN request to keep HR under 130's.  During session pt HR remained 115-123 with O2 sats in upper 90's on Partial Vent Support.  Pt able to communicate throughout session by nodding head and gesturing.  Pt indicated comfort with sitting in chair position by giving thumbs up and nodding.  Pt left in chair position with RN made aware.  If HR permits would benefit from attempting dangle EOB next session.    Follow Up Recommendations  SNF     Equipment Recommendations  None recommended by PT    Recommendations for Other Services       Precautions / Restrictions Precautions Precautions: Fall Restrictions Weight Bearing Restrictions: No    Mobility  Bed Mobility Overal bed mobility: Needs Assistance             General bed mobility comments: Utilized chair positioning on bed.  pt's HRremains 115 - 123 during positioning of bed with O2 sats in upper 90's while on Partial Vent Support.  pt's BP 136/79 after coming to sitting position of 60degrees.  pt gives thumbs up and nods that she feels comfortable in chair position.    Transfers                    Ambulation/Gait                 Stairs            Wheelchair Mobility    Modified Rankin (Stroke Patients Only)       Balance                                    Cognition Arousal/Alertness: Awake/alert Behavior During Therapy: WFL for tasks assessed/performed Overall Cognitive Status: Difficult to assess                      Exercises General Exercises - Lower Extremity Ankle Circles/Pumps: AROM;Both;10 reps    General Comments        Pertinent Vitals/Pain Pt shook head when asked  about pain.      Home Living                      Prior Function            PT Goals (current goals can now be found in the care plan section) Acute Rehab PT Goals Patient Stated Goal: to get better PT Goal Formulation: With patient Time For Goal Achievement: 06/13/14 Potential to Achieve Goals: Good Progress towards PT goals: Progressing toward goals    Frequency  Min 3X/week    PT Plan Discharge plan needs to be updated    Co-evaluation             End of Session Equipment Utilized During Treatment:  (Vent) Activity Tolerance: Patient limited by fatigue (Limited by HR.  ) Patient left: in bed;with call bell/phone within reach (in chair position)     Time: [id number] PT Time Calculation (min): 13 min  Charges:  $Therapeutic Activity: 8-22 mins  G CodesSunny [name], Elizabeth [name] [phone] 05/31/2014, 10:20 AM

## 2014-05-31 NOTE — Progress Notes (Signed)
eLink Physician-Brief Progress Note Patient Name: Elizabeth [name] DOB: 06-28-1956 MRN: [id number]  Date of Service  05/31/2014   HPI/Events of Note     eICU Interventions  Ativan 0.5 q hs prn - home med   Intervention Category Minor Interventions: Agitation / anxiety - evaluation and management  ALVA,RAKESH V. 05/31/2014, 9:52 PM

## 2014-06-01 LAB — BLOOD GAS, ARTERIAL
Acid-Base Excess: 16 mmol/L — ABNORMAL HIGH (ref 0.0–2.0)
Bicarbonate: 41.7 mEq/L — ABNORMAL HIGH (ref 20.0–24.0)
Delivery systems: POSITIVE
Drawn by: 41934
EXPIRATORY PAP: 5
FIO2: 30 %
Inspiratory PAP: 10
O2 Saturation: 98.7 %
Patient temperature: 97.8
TCO2: 43.8 mmol/L (ref 0–100)
pCO2 arterial: 66.8 mmHg (ref 35.0–45.0)
pH, Arterial: 7.409 (ref 7.350–7.450)
pO2, Arterial: 123 mmHg — ABNORMAL HIGH (ref 80.0–100.0)

## 2014-06-01 LAB — GLUCOSE, CAPILLARY
Glucose-Capillary: 81 mg/dL (ref 70–99)
Glucose-Capillary: 123 mg/dL — ABNORMAL HIGH (ref 70–99)
Glucose-Capillary: 121 mg/dL — ABNORMAL HIGH (ref 70–99)
Glucose-Capillary: 83 mg/dL (ref 70–99)

## 2014-06-01 MED ORDER — FENTANYL CITRATE 0.05 MG/ML IJ SOLN: 25.0000 ug | INTRAMUSCULAR | Status: DC | PRN

## 2014-06-01 MED ORDER — BUDESONIDE-FORMOTEROL FUMARATE 160-4.5 MCG/ACT IN AERO
2.0000 | INHALATION_SPRAY | Freq: Two times a day (BID) | RESPIRATORY_TRACT | Status: DC
  Filled 2014-06-01: qty 6

## 2014-06-01 MED ORDER — PANTOPRAZOLE SODIUM 40 MG PO TBEC
40.0000 mg | DELAYED_RELEASE_TABLET | Freq: Every day | ORAL | Status: DC
  Administered 2014-06-01 – 2014-06-02 (×2): 40 mg via ORAL
  Filled 2014-06-01 (×2): qty 1

## 2014-06-01 MED ORDER — LEVALBUTEROL HCL 0.63 MG/3ML IN NEBU
0.6300 mg | INHALATION_SOLUTION | Freq: Four times a day (QID) | RESPIRATORY_TRACT | Status: DC
  Administered 2014-06-01 (×3): 0.63 mg via RESPIRATORY_TRACT
  Filled 2014-06-01 (×8): qty 3

## 2014-06-01 MED ORDER — TIOTROPIUM BROMIDE MONOHYDRATE 18 MCG IN CAPS
18.0000 ug | ORAL_CAPSULE | Freq: Every day | RESPIRATORY_TRACT | Status: DC
  Filled 2014-06-01: qty 5

## 2014-06-01 NOTE — Progress Notes (Signed)
Placed patient on BiPAP with CPAP machine. Size medium mask. Settings 10/5 with O2 bled in. Patient tolerating well at this time.

## 2014-06-01 NOTE — Progress Notes (Signed)
CRITICAL VALUE ALERT  Critical value received: Bicarb 41.7 (ABG)  Date of notification:  06/01/2014  Time of notification:  0300  Critical value read back: Yes  Nurse who received alert:  Eddie NorthJennifer Naryiah Schley, RN  MD notified (1st page):  ELINK  Time of first page:  0300  MD notified (2nd page):  Time of second page:  Responding MD:  Molli KnockYacoub  Time MD responded:  0300

## 2014-06-01 NOTE — Progress Notes (Signed)
PULMONARY / CRITICAL CARE MEDICINE   Name: Elizabeth [name] MRN: [id number] DOB: 01/29/1956    ADMISSION DATE:  05/28/2014   REFERRING MD : [facility](Dr. 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 Admitted and proved refractory to NIMVS, steroids, abx and at request of family was transferred to [facility] hospital. She arrives intubated , sedated and in no acute distress. PCCM will admit to our service and she may need tracheostomy and LTAC vs palliation if she proved refractory to current interventions.   Pulm - Orson Aloe, was on BiPAP at home X 3y, pressure decreased to 14/9 since could not tolerate high pressure   STUDIES:   SIGNIFICANT EVENTS: 8/01 - Tx to [facility] from [facility] 8/03 - failed vent wean 8/04 - extubated 8/05 - transferred to [facility]   SUBJECTIVE: Tolerating SBT, altert, no acute complaints.   VITAL SIGNS: Temp:  [97.6 F (36.4 C)-98.6 F (37 C)] 97.7 F (36.5 C) (08/05 0848) Pulse Rate:  [65-103] 100 (08/05 0900) Resp:  [13-24] 13 (08/05 0900) BP: (93-139)/(57-86) 104/60 mmHg (08/05 0900) SpO2:  [91 %-100 %] 100 % (08/05 0900) FiO2 (%):  [30 %-40 %] 40 % (08/05 0428) Weight:  [147 lb 4.3 oz (66.8 kg)] 147 lb 4.3 oz (66.8 kg) (08/05 0453)  HEMODYNAMICS:    VENTILATOR SETTINGS: Vent Mode:  [-] BIPAP FiO2 (%):  [30 %-40 %] 40 % Set Rate:  [15 bmp] 15 bmp PEEP:  [5 cmH20] 5 cmH20 Pressure Support:  [5 cmH20] 5 cmH20  INTAKE / OUTPUT:  Intake/Output Summary (Last 24 hours) at 06/01/14 0948 Last data filed at 06/01/14 0000  Gross per 24 hour  Intake    760 ml  Output   1550 ml  Net   -790 ml    PHYSICAL EXAMINATION: General:  wdwn female in NAD on vent Neuro:  Awake, alert, non focal HEENT:  OTT, mm pink/moist Cardiovascular:  HSR  RRR Lungs: Decreased bs BL (improved).  Abdomen:  Soft NT + bs Musculoskeletal:  Intact Skin:  Warm and without edema  LABS:  CBC  Recent Labs Lab 05/29/14 0218 05/30/14 0215 05/31/14 0250  WBC 13.9* 16.8* 15.2*  HGB 9.0* 9.0* 8.8*  HCT 30.6* 29.1* 28.1*  PLT 310 267 261   Coag's  Recent Labs Lab 05/28/14 1520  APTT 21*  INR 1.04   BMET  Recent Labs Lab 05/29/14 0218 05/30/14 0215 05/31/14 0250  NA 142 145 142  K 4.1 3.7 4.1  CL 105 104 101  CO2 27 31 35*  BUN 19 17 21   CREATININE 0.47* 0.37* 0.41*  GLUCOSE 149* 164* 113*   Electrolytes  Recent Labs Lab 05/28/14 1520 05/29/14 0218 05/30/14 0215 05/31/14 0250  CALCIUM 8.5 8.2* 8.1* 8.1*  MG 2.3  --   --   --   PHOS 0.8* 1.7*  --   --    Sepsis Markers  Recent Labs Lab 05/28/14 1520  LATICACIDVEN 2.5*  PROCALCITON <0.10   ABG  Recent Labs Lab 05/28/14 1503 05/29/14 0438 06/01/14 0255  PHART 7.453* 7.473* 7.409  PCO2ART 45.5* 40.8 66.8*  PO2ART 150.0* 164.0* 123.0*   Liver Enzymes  Recent Labs Lab 05/28/14 1520  AST 27  ALT 45*  ALKPHOS 48  BILITOT 0.4  ALBUMIN 2.8*  Cardiac Enzymes No results found for this basename: TROPONINI, PROBNP,  in the last 168 hours Glucose  Recent Labs Lab 05/28/14 1416 05/31/14 0732 05/31/14 1153 05/31/14 1727 05/31/14 2301 06/01/14 0834  GLUCAP 112* 122* 169* 92 77 81    Imaging Dg Chest Port 1 View  05/31/2014   CLINICAL DATA:  Intubation.  EXAM: PORTABLE CHEST - 1 VIEW  COMPARISON:  05/30/2014, 05/28/2014.  FINDINGS: Endotracheal tube is 8.2 cm above the carina. NG tube in stable position. Mediastinum and hilar structures are normal. COPD. No focal infiltrate. Small left pleural effusion cannot be excluded. No pneumothorax.  IMPRESSION: 1. Endotracheal tube 8.2 cm above the carina . NG tube in stable position. 2. COPD. 3. Tiny left pleural effusion cannot be excluded. No focal pulmonary infiltrate.   Electronically Signed   By: Maisie Fushomas   Register   On: 05/31/2014 07:22     ASSESSMENT / PLAN:  PULMONARY OETT 7/30>> A: Acute on chronic hypercarbic resp failure - in setting of ES-COPD that is O2, steroid & NIMV dependent with frequent failures. COPD - end stage P:   SBT as tolerated, will plan for extubation this afternoon Change solu-medrol to oral prednisone with taper Azithro for anti-inflammatory effect Pulmicort Scheduled xopenex / atrovent, + PRN xopenex Will require Bipap at night (at least 4 hours), if not, can try cpap Restart Symbicort and Spiriva (starting 8/06) 2-3L O2 via [place] at all times (even at home and with NIVPPV)  CARDIOVASCULAR CVL: n/a A:  HTN P:  lopressor (12.5mg  PO, and give PRN IV).   RENAL A:   No acute issues P:   Replace lytes as indicated Chronic COPD, will have elevated Bicarb  GASTROINTESTINAL A:   GI protection P:   PPI  HEMATOLOGIC A:   No acute process P:  DVT: SQ Heparin  INFECTIOUS A:   Presumed pulmonary infection without clinical support P:   BCx2 8/1>>NGTD Sputum 8/1>>NGTD  Azithro for 5 days total (for anti-inflammatory effect in COPD pts) (last dose 8/08)   ENDOCRINE A:   Hyperglycemia - from presumed steroid use  P:   SSI  NEUROLOGIC A:   Pain - on vent and with AMS [facility].  P:   improved Good candidate for PT mobilisation   TODAY'S SUMMARY: End stage COPD [facility] for hypercarbic respiratory failure with intubation. Extubated on 8/4 afternoon. Transferred out unit today.   I have personally obtained a history, examined the patient, evaluated laboratory and imaging results, formulated the assessment and plan and placed orders.  CRITICAL CARE: The patient is critically ill with multiple organ systems failure and requires high complexity decision making for assessment and support, frequent evaluation and titration of therapies, application of advanced monitoring  technologies and extensive interpretation of multiple databases. Critical Care Time devoted to patient care services described in this note is 30 minutes.      06/01/2014, 9:48 AM   Stephanie AcreVishal Neliah Cuyler, MD [place] [facility] Pager [phone] On Call Pager [phone]

## 2014-06-02 ENCOUNTER — Telehealth: Payer: Self-pay | Admitting: Internal Medicine

## 2014-06-02 DIAGNOSIS — J449 Chronic obstructive pulmonary disease, unspecified: Secondary | ICD-10-CM

## 2014-06-02 LAB — GLUCOSE, CAPILLARY: Glucose-Capillary: 91 mg/dL (ref 70–99)

## 2014-06-02 MED ORDER — AZITHROMYCIN 500 MG PO TABS
500.0000 mg | ORAL_TABLET | Freq: Once | ORAL | Status: AC
  Administered 2014-06-03: 500 mg via ORAL
  Filled 2014-06-02: qty 1

## 2014-06-02 MED ORDER — IPRATROPIUM-ALBUTEROL 0.5-2.5 (3) MG/3ML IN SOLN
3.0000 mL | RESPIRATORY_TRACT | Status: DC
  Administered 2014-06-02 – 2014-06-03 (×7): 3 mL via RESPIRATORY_TRACT
  Filled 2014-06-02 (×8): qty 3

## 2014-06-02 MED ORDER — ENSURE COMPLETE PO LIQD
237.0000 mL | Freq: Two times a day (BID) | ORAL | Status: DC
  Administered 2014-06-03: 237 mL via ORAL

## 2014-06-02 MED ORDER — METOPROLOL TARTRATE 12.5 MG HALF TABLET
12.5000 mg | ORAL_TABLET | Freq: Two times a day (BID) | ORAL | Status: DC
  Administered 2014-06-02 – 2014-06-03 (×2): 12.5 mg via ORAL
  Filled 2014-06-02 (×3): qty 1

## 2014-06-02 MED ORDER — FLUTICASONE PROPIONATE HFA 44 MCG/ACT IN AERO
2.0000 | INHALATION_SPRAY | Freq: Two times a day (BID) | RESPIRATORY_TRACT | Status: DC
Start: 2014-06-02 — End: 2014-06-03
  Administered 2014-06-02 – 2014-06-03 (×2): 2 via RESPIRATORY_TRACT
  Filled 2014-06-02 (×2): qty 10.6

## 2014-06-02 NOTE — Progress Notes (Signed)
Pt placed on BIPAP 10/5 with 3 LPM O2 bleed in via FFM.  Pt tolerating well at this time, RT to monitor and assess as needed.

## 2014-06-02 NOTE — Progress Notes (Signed)
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 Admitted and proved refractory to NIMVS, steroids, abx and at request of family was transferred to [facility] hospital after intubation.   Pulm - Orson Aloe, was on BiPAP at home X 3y, pressure decreased to 14/9 since could not tolerate high pressure.  Pt wants to follow up with Dr. Delton Coombes post discharge.    STUDIES:   SIGNIFICANT EVENTS: 8/01 - Tx to [facility] from [facility] 8/03 - failed vent wean 8/04 - extubated 8/06 - No distress, on floor.  Requesting to follow up with Dr. Delton Coombes.   SUBJECTIVE: Reports mild dyspnea at rest.  O2 at baseline requirements  STAFf MD note: ongoing discussion with case manager on affordable copd med regimen.   VITAL SIGNS: Temp:  [98.4 F (36.9 C)-98.6 F (37 C)] 98.4 F (36.9 C) (08/06 0610) Pulse Rate:  [82-107] 107 (08/06 0610) Resp:  [13-20] 18 (08/06 0610) BP: (104-137)/(60-85) 137/85 mmHg (08/06 0610) SpO2:  [94 %-100 %] 94 % (08/06 0610) Weight:  [145 lb 4.5 oz (65.9 kg)] 145 lb 4.5 oz (65.9 kg) (08/06 0610)  INTAKE / OUTPUT:  Intake/Output Summary (Last 24 hours) at 06/02/14 0851 Last data filed at 06/01/14 1736  Gross per 24 hour  Intake    610 ml  Output    390 ml  Net    220 ml    PHYSICAL EXAMINATION: General:  wdwn female in NAD, up on side of bed Neuro:  Awake, alert, non focal HEENT:  OTT, mm pink/moist Cardiovascular:  HSR RRR Lungs: Decreased bilaterally, no wheezing Abdomen:  Soft NT + bs Musculoskeletal:  Intact Skin:  Warm and without edema  LABS:  CBC  Recent Labs Lab 05/29/14 0218 05/30/14 0215 05/31/14 0250  WBC 13.9* 16.8* 15.2*   HGB 9.0* 9.0* 8.8*  HCT 30.6* 29.1* 28.1*  PLT 310 267 261   BMET  Recent Labs Lab 05/29/14 0218 05/30/14 0215 05/31/14 0250  NA 142 145 142  K 4.1 3.7 4.1  CL 105 104 101  CO2 27 31 35*  BUN 19 17 21   CREATININE 0.47* 0.37* 0.41*  GLUCOSE 149* 164* 113*   ABG  Recent Labs Lab 05/28/14 1503 05/29/14 0438 06/01/14 0255  PHART 7.453* 7.473* 7.409  PCO2ART 45.5* 40.8 66.8*  PO2ART 150.0* 164.0* 123.0*   Glucose  Recent Labs Lab 05/31/14 2301 06/01/14 0834 06/01/14 1152 06/01/14 1609 06/01/14 2124 06/02/14 0654  GLUCAP 77 81 123* 121* 83 91    Imaging No results found.   ASSESSMENT / PLAN:  PULMONARY OETT 7/30>>8/4 A: Acute on chronic hypercarbic resp failure - in setting of ES-COPD that is O2, steroid & NIMV dependent with frequent failures. ESCOPD with Acute Exacerbation OSA P:   Prednisone with slow taper, in place Azithro for anti-inflammatory effect, stop date in place Due to cost  symbicort, spiriva (for now, see below) - to DUONEB scheduled + Flovent MDI (probably cheapest option) - STAFF MD nottation Scheduled xopenex / atrovent, + PRN xopenex Advance diet as tolerated Continue nocturnal Bipap, will need home settings adjusted  CARDIOVASCULAR A:  HTN P:  Lopressor 12.5mg  PO  BID + PRN IV   TODAY'S SUMMARY: [facility] stage COPD [facility] for hypercarbic respiratory failure with intubation.   Extubated on 8/4 afternoon.  Follow up with Dr. Delton CoombesByrum arranged for discharge.   Canary BrimBrandi Ollis, NP-C [place] [facility] Pgr: [phone] or [phone]   06/02/2014, 8:51 AM   STAFF MD NOTE COPD rx regiment changed due to cost issues. Home with PT, OT, rehab and copd program. High risk COPD patient; will screen for eosinophils at discharge to see if this is a risk factor for aECOPD. PCCM will follow      Dr. Kalman ShanMurali Alby Schwabe, M.D., [facility]C.P Pulmonary and Critical Care Medicine Staff Physician [place]  [facility] [place] [facility] Pager: [phone], If no answer or between  15:00h - 7:00h: call [phone]  06/02/2014 11:27 AM

## 2014-06-02 NOTE — Progress Notes (Signed)
Physical Therapy Treatment Patient Details Name: Elizabeth [name] W Monsour MRN: [id number] DOB: 1956-09-22 Today's Date: 06/02/2014    History of Present Illness pt presents with End Stage COPD with VDRF.      PT Comments    Pt admitted with end stage COPD and VDRF. Pt currently with functional limitations due to balance and endurance deficits. Will need continued PT to gain strength and endurance.   Pt will benefit from skilled PT to increase their independence and safety with mobility to allow discharge to the venue listed below.   Follow Up Recommendations  SNF     Equipment Recommendations  None recommended by PT    Recommendations for Other Services       Precautions / Restrictions Precautions Precautions: Fall Restrictions Weight Bearing Restrictions: No    Mobility  Bed Mobility Overal bed mobility: Needs Assistance Bed Mobility: Rolling;Supine to Sit Rolling: Min guard   Supine to sit: Min guard        Transfers Overall transfer level: Needs assistance Equipment used: Rolling walker (2 wheeled) Transfers: Sit to/from Stand Sit to Stand: Min assist;+2 safety/equipment         General transfer comment: cues for hand placement needed.  Initial steadying assist neeeded.    Ambulation/Gait Ambulation/Gait assistance: Min assist;+2 safety/equipment Ambulation Distance (Feet): 70 Feet Assistive device: Rolling walker (2 wheeled) Gait Pattern/deviations: Step-through pattern;Decreased step length - right;Decreased step length - left;Decreased stride length;Wide base of support   Gait velocity interpretation: Below normal speed for age/gender General Gait Details: Pt needed steadying assist as lacks full postural stability.  Pt felt like her left LE was weaker with noted knee instability.  No buckling however.  Pt was able to ambualate in hallway and return to room.  Needed assist for controlling descent into chair.     Stairs            Wheelchair Mobility     Modified Rankin (Stroke Patients Only)       Balance Overall balance assessment: Needs assistance;History of Falls Sitting-balance support: No upper extremity supported;Feet supported Sitting balance-Leahy Scale: Fair     Standing balance support: Bilateral upper extremity supported;During functional activity Standing balance-Leahy Scale: Poor Standing balance comment: Pt requires use of UEs for support with RW for ambulation and standing.                      Cognition Arousal/Alertness: Awake/alert Behavior During Therapy: WFL for tasks assessed/performed Overall Cognitive Status: Within Functional Limits for tasks assessed                      Exercises General Exercises - Lower Extremity Ankle Circles/Pumps: AROM;Both;10 reps Long Arc Quad: AROM;Both;10 reps;Seated Other Exercises Other Exercises: Reviewed use of incentive spirometer.     General Comments        Pertinent Vitals/Pain HR 106-114 bpm, O2 on arrival at 3L was 93%.  O2 on 6L for ambulation kept sat >90%.  Once pt sat down and placed back on 3L, took several minutes for O2 to incr from 74%- 92%.  No pain .    Home Living                      Prior Function            PT Goals (current goals can now be found in the care plan section) Progress towards PT goals: Progressing toward goals    Frequency  Min 3X/week    PT Plan Current plan remains appropriate    Co-evaluation             End of Session Equipment Utilized During Treatment: Gait belt;Oxygen Activity Tolerance: Patient limited by fatigue Patient left: in chair;with call bell/phone within reach;with family/visitor present;with chair alarm set     Time: 7209-4709 PT Time Calculation (min): 24 min  Charges:  $Gait Training: 8-22 mins $Therapeutic Exercise: 8-22 mins                    G Codes:      INGOLD,Jossilyn Benda 2014-06-09, 11:02 AM [facility] [phone]  (pager)

## 2014-06-02 NOTE — Progress Notes (Addendum)
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  Patient extubated 8/4.  TF (Jevity 1.2 formula) discontinued with extubation.  Transferred to 2W-Cardiac from 16M-MICU 8/5.  Patient states her appetite is "pretty good".  Currently on a Heart Healthy diet with variable PO intake at 50-100% per flowsheet records.  Drinks chocolate Ensure occasionally at home.  Would like (and benefit from having) during hospital stay.  RD to order.  Height: Ht Readings from Last 1 Encounters:  05/30/14 5\' 7"  (1.702 m)    Weight: Wt Readings from Last 1 Encounters:  06/02/14 145 lb 4.5 oz (65.9 kg)    BMI:  Body mass index is 22.75 kg/(m^2).  Re-estimated Nutritional Needs: Kcal:1800-2000 Protein: 90-100 gm Fluid: 1.8-2.0 L  Skin: Intact  Diet Order: Cardiac   Intake/Output Summary (Last 24 hours) at 06/02/14 1110 Last data filed at 06/02/14 0900  Gross per 24 hour  Intake    840 ml  Output    390 ml  Net    450 ml    Labs:   Recent Labs Lab 05/28/14 1520 05/29/14 0218 05/30/14 0215 05/31/14 0250  NA 139 142 145 142  K 4.2 4.1 3.7 4.1  CL 102 105 104 101  CO2 32 27 31 35*  BUN 15 19 17 21   CREATININE 0.48* 0.47* 0.37* 0.41*  CALCIUM 8.5  8.2* 8.1* 8.1*  MG 2.3  --   --   --   PHOS 0.8* 1.7*  --   --   GLUCOSE 127* 149* 164* 113*    CBG (last 3)   Recent Labs  06/01/14 1609 06/01/14 2124 06/02/14 0654  GLUCAP 121* 83 91    Scheduled Meds: . antiseptic oral rinse  7 mL Mouth Rinse BID  . azithromycin  500 mg Intravenous Q24H  . budesonide-formoterol  2 puff Inhalation BID  . heparin  5,000 Units Subcutaneous 3 times per day  . levalbuterol  0.63 mg Nebulization QID  . metoprolol tartrate  12.5 mg Oral BID  . pantoprazole  40 mg Oral QHS  . [START ON 06/03/2014] predniSONE  40 mg Per Tube Q breakfast   Followed by  . [START ON 06/06/2014] predniSONE  30 mg Per Tube Q breakfast   Followed by  . [START ON 06/09/2014] predniSONE  20 mg Per Tube Q breakfast   Followed by  . [START ON 06/12/2014] predniSONE  10 mg Per Tube Q breakfast  . tiotropium  18 mcg Inhalation Daily    Continuous Infusions:    History reviewed. No pertinent past medical history.  History reviewed. No pertinent past surgical history.  Maureen ChattersKatie Wilgus Deyton, RD, LDN Pager #: [phone] After-Hours Pager #: [phone]

## 2014-06-02 NOTE — Telephone Encounter (Signed)
Rob  YOu are seeing this patient 06/24/14 in office. Do you mind checking her CBC with DIFF. Wonder if she has eos > 300 that suggest risk for aECOPD and therefore mightq qualify her for benralizumab trial?  Thanks  Dr. Kalman ShanMurali Meliyah Simon, M.D., [facility]C.C.P Pulmonary and Critical Care Medicine Staff Physician [place] [facility] [place] [facility] Pager: [phone], If no answer or between  15:00h - 7:00h: call [phone]  06/02/2014 11:29 AM

## 2014-06-02 NOTE — Progress Notes (Signed)
[facility] PROGRESS NOTE  Elizabeth [name] NFA:[id number] DOB: [birth year] DOA: 05/28/2014 PCP: No primary provider on file. Interim summary: 58 y/o former smoker (quit 7 years ago) with end stage COPD on 3L [place] who was discharged form [facility] 05/22/14 for AECOPD and returned 7/27 for hypercarbic resp failure. She was [facility] and [facility] and extubated on 8/4/.[facility] further management.   Assessment/Plan: 1. Acute respiratory failure from COPD exacerbation :  - currently on prednisone taper. Resume symbicort and spiriva.   Hypertension: Well controlled.    Anemia : Appears t be at baseline. Normocytic.   Leukocytosis: Possibly from steroids.   Code Status: full code Family Communication: care giver at bedside Disposition Plan: possibly in a day or two.    Consultants:  pccm.  Procedures: CXR  SIGNIFICANT EVENTS:  8/01 - [facility]  8/03 - failed vent wean  8/04 - extubated  8/06 - No distress, on floor. Requesting to follow up with Dr. Delton CoombesByrum.   Antibiotics:  azithromycin  HPI/Subjective: Breathing better, on 3 lit of [place] OXY at home.  Wants to know when she can go home.   Objective: Filed Vitals:   06/02/14 1326  BP: 93/50  Pulse: 93  Temp: 98.2 F (36.8 C)  Resp: 19    Intake/Output Summary (Last 24 hours) at 06/02/14 1422 Last data filed at 06/02/14 0900  Gross per 24 hour  Intake    840 ml  Output      0 ml  Net    840 ml   Filed Weights   05/31/14 0500 06/01/14 0453 06/02/14 0610  Weight: 67.2 kg (148 lb 2.4 oz) 66.8 kg (147 lb 4.3 oz) 65.9 kg (145 lb 4.5 oz)    Exam:   General:  Alert afebrile comfortable  Cardiovascular: s1s2  Respiratory: ctab  Abdomen: soft NT ND BS+  Musculoskeletal: LEFT UPPER EXT SWOLLEN.   Data Reviewed: Basic Metabolic Panel:  Recent Labs Lab 05/28/14 1520 05/29/14 0218 05/30/14 0215 05/31/14 0250  NA 139 142 145 142  K 4.2 4.1  3.7 4.1  CL 102 105 104 101  CO2 32 27 31 35*  GLUCOSE 127* 149* 164* 113*  BUN 15 19 17 21   CREATININE 0.48* 0.47* 0.37* 0.41*  CALCIUM 8.5 8.2* 8.1* 8.1*  MG 2.3  --   --   --   PHOS 0.8* 1.7*  --   --    Liver Function Tests:  Recent Labs Lab 05/28/14 1520  AST 27  ALT 45*  ALKPHOS 48  BILITOT 0.4  PROT 5.2*  ALBUMIN 2.8*   No results found for this basename: LIPASE, AMYLASE,  in the last 168 hours No results found for this basename: AMMONIA,  in the last 168 hours CBC:  Recent Labs Lab 05/28/14 1520 05/29/14 0218 05/30/14 0215 05/31/14 0250  WBC 17.1* 13.9* 16.8* 15.2*  HGB 10.1* 9.0* 9.0* 8.8*  HCT 35.2* 30.6* 29.1* 28.1*  MCV 103.8* 97.8 94.2 94.6  PLT 269 310 267 261   Cardiac Enzymes: No results found for this basename: CKTOTAL, CKMB, CKMBINDEX, TROPONINI,  in the last 168 hours BNP (last 3 results) No results found for this basename: PROBNP,  in the last 8760 hours CBG:  Recent Labs Lab 06/01/14 0834 06/01/14 1152 06/01/14 1609 06/01/14 2124 06/02/14 0654  GLUCAP 81 123* 121* 83 91    Recent Results (from the past 240 hour(s))  MRSA PCR SCREENING  Status: None   Collection Time    05/28/14  2:08 PM      Result Value Ref Range Status   MRSA by PCR NEGATIVE  NEGATIVE Final   Comment:            The GeneXpert MRSA Assay (FDA     approved for NASAL specimens     only), is one component of a     comprehensive MRSA colonization     surveillance program. It is not     intended to diagnose MRSA     infection nor to guide or     monitor treatment for     MRSA infections.  URINE CULTURE     Status: None   Collection Time    05/28/14  3:00 PM      Result Value Ref Range Status   Specimen Description URINE, CATHETERIZED   Final   Special Requests NONE   Final   Culture  Setup Time     Final   Value: 05/28/2014 21:11     Performed at [facility] Count     Final   Value: NO GROWTH     Performed at [facility]    Culture     Final   Value: NO GROWTH     Performed at [facility]   Report Status 05/29/2014 FINAL   Final  CULTURE, BLOOD (ROUTINE X 2)     Status: None   Collection Time    05/28/14  3:20 PM      Result Value Ref Range Status   Specimen Description BLOOD LEFT ARM   Final   Special Requests BOTTLES DRAWN AEROBIC AND ANAEROBIC 10CC   Final   Culture  Setup Time     Final   Value: 05/28/2014 20:53     Performed at [facility]   Culture     Final   Value:        BLOOD CULTURE RECEIVED NO GROWTH TO DATE CULTURE WILL BE HELD FOR 5 DAYS BEFORE ISSUING A FINAL NEGATIVE REPORT     Performed at [facility]   Report Status PENDING   Incomplete  CULTURE, BLOOD (ROUTINE X 2)     Status: None   Collection Time    05/28/14  3:30 PM      Result Value Ref Range Status   Specimen Description BLOOD LEFT HAND   Final   Special Requests BOTTLES DRAWN AEROBIC ONLY 10CC   Final   Culture  Setup Time     Final   Value: 05/28/2014 20:53     Performed at [facility]   Culture     Final   Value:        BLOOD CULTURE RECEIVED NO GROWTH TO DATE CULTURE WILL BE HELD FOR 5 DAYS BEFORE ISSUING A FINAL NEGATIVE REPORT     Performed at [facility]   Report Status PENDING   Incomplete  CULTURE, RESPIRATORY (NON-EXPECTORATED)     Status: None   Collection Time    05/28/14  5:15 PM      Result Value Ref Range Status   Specimen Description TRACHEAL ASPIRATE   Final   Special Requests Normal   Final   Gram Stain     Final   Value: FEW WBC PRESENT, PREDOMINANTLY PMN     FEW SQUAMOUS EPITHELIAL CELLS PRESENT     FEW GRAM POSITIVE COCCI IN PAIRS  IN CHAINS IN CLUSTERS     Performed at [facility]   Culture     Final   Value: Non-Pathogenic Oropharyngeal-type Flora Isolated.     Performed at [facility]   Report Status 05/31/2014 FINAL   Final     Studies: No results found.  Scheduled Meds: . antiseptic oral rinse  7 mL Mouth Rinse BID  .  [START ON 06/03/2014] azithromycin  500 mg Oral Once  . feeding supplement (ENSURE COMPLETE)  237 mL Oral BID BM  . fluticasone  2 puff Inhalation BID  . heparin  5,000 Units Subcutaneous 3 times per day  . ipratropium-albuterol  3 mL Nebulization Q4H  . metoprolol tartrate  12.5 mg Oral BID  . pantoprazole  40 mg Oral QHS  . [START ON 06/03/2014] predniSONE  40 mg Per Tube Q breakfast   Followed by  . [START ON 06/06/2014] predniSONE  30 mg Per Tube Q breakfast   Followed by  . [START ON 06/09/2014] predniSONE  20 mg Per Tube Q breakfast   Followed by  . [START ON 06/12/2014] predniSONE  10 mg Per Tube Q breakfast   Continuous Infusions:   Active Problems:   End stage COPD   HTN (hypertension)   Hiatal hernia   Acute and chronic respiratory failure   Hypercarbia   Respiratory failure   Protein-calorie malnutrition, severe    Time spent: 25 minutes.     [facility]  [facility] Pager [phone]. If 7PM-7AM, please contact night-coverage at [email or web address], password [facility] 06/02/2014, 2:22 PM  LOS: 5 days

## 2014-06-03 DIAGNOSIS — R079 Chest pain, unspecified: Secondary | ICD-10-CM

## 2014-06-03 DIAGNOSIS — M7989 Other specified soft tissue disorders: Secondary | ICD-10-CM

## 2014-06-03 LAB — CULTURE, BLOOD (ROUTINE X 2)
Culture: NO GROWTH
Culture: NO GROWTH

## 2014-06-03 LAB — GLUCOSE, CAPILLARY: Glucose-Capillary: 88 mg/dL (ref 70–99)

## 2014-06-03 MED ORDER — ENSURE COMPLETE PO LIQD: 237.0000 mL | Freq: Two times a day (BID) | ORAL | Status: DC

## 2014-06-03 MED ORDER — PREDNISONE 20 MG PO TABS: 20.0000 mg | ORAL_TABLET | Freq: Every day | ORAL | Status: DC

## 2014-06-03 MED ORDER — PREDNISONE 10 MG PO TABS: 10.0000 mg | ORAL_TABLET | Freq: Every day | ORAL | Status: AC

## 2014-06-03 MED ORDER — LORAZEPAM 1 MG PO TABS: 1.0000 mg | ORAL_TABLET | Freq: Three times a day (TID) | ORAL | Status: DC | PRN

## 2014-06-03 MED ORDER — IPRATROPIUM-ALBUTEROL 0.5-2.5 (3) MG/3ML IN SOLN: 3.0000 mL | Freq: Four times a day (QID) | RESPIRATORY_TRACT | Status: DC | PRN

## 2014-06-03 MED ORDER — FLUTICASONE PROPIONATE HFA 44 MCG/ACT IN AERO: 2.0000 | INHALATION_SPRAY | Freq: Two times a day (BID) | RESPIRATORY_TRACT | Status: DC

## 2014-06-03 MED ORDER — PREDNISONE 10 MG PO TABS: 30.0000 mg | ORAL_TABLET | Freq: Every day | ORAL | Status: AC

## 2014-06-03 MED ORDER — PREDNISONE 20 MG PO TABS: 40.0000 mg | ORAL_TABLET | Freq: Every day | ORAL | Status: AC

## 2014-06-03 NOTE — Discharge Summary (Signed)
Physician Discharge Summary  Elizabeth [name] ZOX:[id number] DOB: [birth year] DOA: 05/28/2014  PCP: No primary provider on file.  Admit date: 05/28/2014 Discharge date: 06/03/2014  Time spent: 25  minutes  Recommendations for Outpatient Follow-up:  1. Follow up with pulmonary as recommended.   Discharge Diagnoses:  Active Problems:   End stage COPD   HTN (hypertension)   Hiatal hernia   Acute and chronic respiratory failure   Hypercarbia   Respiratory failure   Protein-calorie malnutrition, severe   Discharge Condition: improved.   Diet recommendation: low sodium diet  Filed Weights   06/01/14 0453 06/02/14 0610 06/03/14 0326  Weight: 66.8 kg (147 lb 4.3 oz) 65.9 kg (145 lb 4.5 oz) 61.598 kg (135 lb 12.8 oz)    History of present illness:  58 y/o former smoker (quit 7 years ago) with end stage COPD on 3L [place] who was discharged form [facility] 05/22/14 for AECOPD and returned 7/27 for hypercarbic resp failure. She was intubated and transferred to [facility] and extubated on 8/4/.she was transferred to [facility] service on 8/6 for further management.    Hospital Course:   1. Acute respiratory failure from COPD exacerbation :  - currently on prednisone taper. Resume fluticasone and neb treatments.  Hypertension:  Well controlled.  Anemia :  Appears t be at baseline. Normocytic.  Leukocytosis:  Possibly from steroids.  Procedures:  NONE  Consultations:  PCCM  Discharge Exam: Filed Vitals:   06/03/14 1042  BP:   Pulse: 105  Temp:   Resp:     General: alert afebrile comfortable Cardiovascular: s1s2 Respiratory: ctab  Discharge Instructions You were cared for by a hospitalist during your hospital stay. If you have any questions about your discharge medications or the care you received while you were in the hospital after you are discharged, you can call the unit and asked to speak with the hospitalist on call if the hospitalist that took care of you is not available. Once you are  discharged, your primary care physician will handle any further medical issues. Please note that NO REFILLS for any discharge medications will be authorized once you are discharged, as it is imperative that you return to your primary care physician (or establish a relationship with a primary care physician if you do not have one) for your aftercare needs so that they can reassess your need for medications and monitor your lab values.     Medication List    STOP taking these medications       levalbuterol 1.25 MG/0.5ML nebulizer solution  Commonly known as:  XOPENEX     levofloxacin 500 MG tablet  Commonly known as:  LEVAQUIN      TAKE these medications       albuterol 108 (90 BASE) MCG/ACT inhaler  Commonly known as:  PROVENTIL HFA;VENTOLIN HFA  Inhale 1 puff into the lungs every 6 (six) hours as needed for wheezing or shortness of breath.     alendronate 70 MG tablet  Commonly known as:  FOSAMAX  Take 70 mg by mouth every Wednesday. Take with a full glass of water on an empty stomach.     azelastine 0.1 % nasal spray  Commonly known as:  ASTELIN  Place 1 spray into both nostrils 2 (two) times daily. Use in each nostril as directed     feeding supplement (ENSURE COMPLETE) Liqd  Take 237 mLs by mouth 2 (two) times daily between meals.     fluticasone 44 MCG/ACT inhaler  Commonly known  as:  FLOVENT HFA  Inhale 2 puffs into the lungs 2 (two) times daily.     ipratropium-albuterol 0.5-2.5 (3) MG/3ML Soln  Commonly known as:  DUONEB  Take 3 mLs by nebulization every 6 (six) hours as needed.     LORazepam 1 MG tablet  Commonly known as:  ATIVAN  Take 1 mg by mouth every 8 (eight) hours as needed for anxiety.     metoprolol tartrate 25 MG tablet  Commonly known as:  LOPRESSOR  Take 12.5 mg by mouth 2 (two) times daily.     pantoprazole 40 MG tablet  Commonly known as:  PROTONIX  Take 40 mg by mouth daily.     predniSONE 20 MG tablet  Commonly known as:  DELTASONE  Place  2 tablets (40 mg total) into feeding tube daily with breakfast.     predniSONE 10 MG tablet  Commonly known as:  DELTASONE  Place 3 tablets (30 mg total) into feeding tube daily with breakfast.  Start taking on:  06/06/2014     predniSONE 20 MG tablet  Commonly known as:  DELTASONE  Place 1 tablet (20 mg total) into feeding tube daily with breakfast.  Start taking on:  06/09/2014     predniSONE 10 MG tablet  Commonly known as:  DELTASONE  Place 1 tablet (10 mg total) into feeding tube daily with breakfast.  Start taking on:  06/12/2014     simvastatin 20 MG tablet  Commonly known as:  ZOCOR  Take 20 mg by mouth daily at 6 PM.     valACYclovir 500 MG tablet  Commonly known as:  VALTREX  Take 500 mg by mouth every morning.       Allergies  Allergen Reactions  . Bactrim Ds [Sulfamethoxazole-Trimethoprim] Hives  . Codeine Nausea And Vomiting  . Morphine Nausea And Vomiting  . Demerol [Meperidine] Other (See Comments)       Follow-up Information   Follow up with Leslye Peer., MD On 06/24/2014. (Appt at 3:15 for establishment with Pulmonary MD for COPD)    Specialty:  Pulmonary Disease   Contact information:   [address] [place] [address] [phone]        The results of significant diagnostics from this hospitalization (including imaging, microbiology, ancillary and laboratory) are listed below for reference.    Significant Diagnostic Studies: Dg Chest Port 1 View  05/31/2014   CLINICAL DATA:  Intubation.  EXAM: PORTABLE CHEST - 1 VIEW  COMPARISON:  05/30/2014, 05/28/2014.  FINDINGS: Endotracheal tube is 8.2 cm above the carina. NG tube in stable position. Mediastinum and hilar structures are normal. COPD. No focal infiltrate. Small left pleural effusion cannot be excluded. No pneumothorax.  IMPRESSION: 1. Endotracheal tube 8.2 cm above the carina . NG tube in stable position. 2. COPD. 3. Tiny left pleural effusion cannot be excluded. No focal pulmonary  infiltrate.   Electronically Signed   By: Maisie Fus  Register   On: 05/31/2014 07:22   Dg Chest Port 1 View  05/30/2014   CLINICAL DATA:  Pneumonitis  EXAM: PORTABLE CHEST - 1 VIEW  COMPARISON:  05/28/2014  FINDINGS: The lungs are again hyperinflated consistent with COPD. Nasogastric catheter remains coiled within the stomach. A hiatal hernia is again noted. No focal infiltrate or sizable effusion is seen. An endotracheal tube is noted approximately 9 cm above the carina. No bony abnormality is seen.  IMPRESSION: COPD without acute abnormality.   Electronically Signed   By: Eulah Pont.D.  On: 05/30/2014 07:11   Dg Chest Port 1 View  05/28/2014   CLINICAL DATA:  Left-sided neck distension  EXAM: PORTABLE CHEST - 1 VIEW  COMPARISON:  None.  FINDINGS: Cardiac shadow is within normal limits. The lungs are hyperinflated consistent with COPD. An endotracheal tube is noted with the tip approximately 7 cm above the carina in satisfactory position. A nasogastric catheter is noted coiled within the stomach. No focal infiltrate is seen.  IMPRESSION: COPD without acute abnormality.  Tubes and lines as described.   Electronically Signed   By: Alcide Clever M.D.   On: 05/28/2014 15:04    Microbiology: Recent Results (from the past 240 hour(s))  MRSA PCR SCREENING     Status: None   Collection Time    05/28/14  2:08 PM      Result Value Ref Range Status   MRSA by PCR NEGATIVE  NEGATIVE Final   Comment:            The GeneXpert MRSA Assay (FDA     approved for NASAL specimens     only), is one component of a     comprehensive MRSA colonization     surveillance program. It is not     intended to diagnose MRSA     infection nor to guide or     monitor treatment for     MRSA infections.  URINE CULTURE     Status: None   Collection Time    05/28/14  3:00 PM      Result Value Ref Range Status   Specimen Description URINE, CATHETERIZED   Final   Special Requests NONE   Final   Culture  Setup Time     Final    Value: 05/28/2014 21:11     Performed at [facility] Count     Final   Value: NO GROWTH     Performed at [facility]   Culture     Final   Value: NO GROWTH     Performed at [facility]   Report Status 05/29/2014 FINAL   Final  CULTURE, BLOOD (ROUTINE X 2)     Status: None   Collection Time    05/28/14  3:20 PM      Result Value Ref Range Status   Specimen Description BLOOD LEFT ARM   Final   Special Requests BOTTLES DRAWN AEROBIC AND ANAEROBIC 10CC   Final   Culture  Setup Time     Final   Value: 05/28/2014 20:53     Performed at [facility]   Culture     Final   Value: NO GROWTH 5 DAYS     Performed at [facility]   Report Status 06/03/2014 FINAL   Final  CULTURE, BLOOD (ROUTINE X 2)     Status: None   Collection Time    05/28/14  3:30 PM      Result Value Ref Range Status   Specimen Description BLOOD LEFT HAND   Final   Special Requests BOTTLES DRAWN AEROBIC ONLY 10CC   Final   Culture  Setup Time     Final   Value: 05/28/2014 20:53     Performed at [facility]   Culture     Final   Value: NO GROWTH 5 DAYS     Performed at [facility]   Report Status 06/03/2014 FINAL   Final  CULTURE, RESPIRATORY (NON-EXPECTORATED)  Status: None   Collection Time    05/28/14  5:15 PM      Result Value Ref Range Status   Specimen Description TRACHEAL ASPIRATE   Final   Special Requests Normal   Final   Gram Stain     Final   Value: FEW WBC PRESENT, PREDOMINANTLY PMN     FEW SQUAMOUS EPITHELIAL CELLS PRESENT     FEW GRAM POSITIVE COCCI IN PAIRS     IN CHAINS IN CLUSTERS     Performed at [facility]   Culture     Final   Value: Non-Pathogenic Oropharyngeal-type Flora Isolated.     Performed at [facility]   Report Status 05/31/2014 FINAL   Final     Labs: Basic Metabolic Panel:  Recent Labs Lab 05/28/14 1520 05/29/14 0218 05/30/14 0215 05/31/14 0250  NA 139 142 145 142  K 4.2  4.1 3.7 4.1  CL 102 105 104 101  CO2 32 27 31 35*  GLUCOSE 127* 149* 164* 113*  BUN 15 19 17 21   CREATININE 0.48* 0.47* 0.37* 0.41*  CALCIUM 8.5 8.2* 8.1* 8.1*  MG 2.3  --   --   --   PHOS 0.8* 1.7*  --   --    Liver Function Tests:  Recent Labs Lab 05/28/14 1520  AST 27  ALT 45*  ALKPHOS 48  BILITOT 0.4  PROT 5.2*  ALBUMIN 2.8*   No results found for this basename: LIPASE, AMYLASE,  in the last 168 hours No results found for this basename: AMMONIA,  in the last 168 hours CBC:  Recent Labs Lab 05/28/14 1520 05/29/14 0218 05/30/14 0215 05/31/14 0250  WBC 17.1* 13.9* 16.8* 15.2*  HGB 10.1* 9.0* 9.0* 8.8*  HCT 35.2* 30.6* 29.1* 28.1*  MCV 103.8* 97.8 94.2 94.6  PLT 269 310 267 261   Cardiac Enzymes: No results found for this basename: CKTOTAL, CKMB, CKMBINDEX, TROPONINI,  in the last 168 hours BNP: BNP (last 3 results) No results found for this basename: PROBNP,  in the last 8760 hours CBG:  Recent Labs Lab 06/01/14 1152 06/01/14 1609 06/01/14 2124 06/02/14 0654 06/03/14 1230  GLUCAP 123* 121* 83 91 88       Signed:  Ramya Vanbergen  [facility] 06/03/2014, 2:28 PM

## 2014-06-03 NOTE — Clinical Social Work Placement (Signed)
Clinical Social Work Department CLINICAL SOCIAL WORK PLACEMENT NOTE 06/03/2014  Patient:  Elizabeth [name],Elizabeth [name]  Account Number:  [id number] Admit date:  05/28/2014  Clinical Social Worker:  Cherre BlancJOSEPH [name] Elizabeth [name], [place]  Date/time:  06/03/2014 04:03 PM  Clinical Social Work is seeking post-discharge placement for this patient at the following level of care:   SKILLED NURSING   (*CSW will update this form in Epic as items are completed)   06/03/2014  Patient/family provided with [facility] Department of Clinical Social Work's list of facilities offering this level of care within the geographic area requested by the patient (or if unable, by the patient's family).  06/03/2014  Patient/family informed of their freedom to choose among providers that offer the needed level of care, that participate in Medicare, Medicaid or managed care program needed by the patient, have an available bed and are willing to accept the patient.  06/03/2014  Patient/family informed of [facility]' ownership interest in [facility], as well as of the fact that they are under no obligation to receive care at this facility.  PASARR submitted to EDS on 06/01/2014 PASARR number received on 06/01/2014  FL2 transmitted to all facilities in geographic area requested by pt/family on  06/03/2014 FL2 transmitted to all facilities within larger geographic area on   Patient informed that his/her managed care company has contracts with or will negotiate with  certain facilities, including the following:     Patient/family informed of bed offers received:  06/03/2014 [facility] Physician recommends and patient chooses bed at    [facility]  06/03/2014 Patient to be transferred to facility by Husband Patient and family notified of transfer on 06/03/2014 Name of family member notified:  Onalee Huaavid  The following physician request were entered in  Epic:   Additional Comments: Per MD patient [facility]. RN, patient, patient's family, and facility notified of DC. RN given number for report. DC packet on chart. Husband to transport. CSW signing off.     Roddie McBryant Tammala Weider MSW, [place], [place], [phone] Roddie McBryant Airabella Barley MSW, [place], [place], [phone]

## 2014-06-03 NOTE — Progress Notes (Signed)
Physical Therapy Treatment Patient Details Name: Elizabeth [name] MRN: [id number] DOB: 01-Jun-1956 Today's Date: 19-Jun-2014    History of Present Illness pt presents with End Stage COPD with VDRF.      PT Comments    Pt making slow steady progress. Pt now agreeable to ST-SNF.  Follow Up Recommendations  SNF     Equipment Recommendations  None recommended by PT    Recommendations for Other Services       Precautions / Restrictions Precautions Precautions: Fall Restrictions Weight Bearing Restrictions: No    Mobility  Bed Mobility Overal bed mobility: Modified Independent Bed Mobility: Supine to Sit     Supine to sit: Modified independent (Device/Increase time)        Transfers Overall transfer level: Needs assistance Equipment used: 4-wheeled walker;1 person hand held assist Transfers: Sit to/from Stand Sit to Stand: Min assist         General transfer comment: Assist for balance and support.  Ambulation/Gait Ambulation/Gait assistance: Min assist Ambulation Distance (Feet): 110 Feet Assistive device: 4-wheeled walker Gait Pattern/deviations: Step-through pattern;Decreased step length - right;Decreased step length - left Gait velocity: decr Gait velocity interpretation: Below normal speed for age/gender General Gait Details: Assist for balance and support.    Stairs            Wheelchair Mobility    Modified Rankin (Stroke Patients Only)       Balance   Sitting-balance support: No upper extremity supported;Feet supported Sitting balance-Leahy Scale: Good     Standing balance support: Bilateral upper extremity supported Standing balance-Leahy Scale: Poor Standing balance comment: Needs upper extremity support to maintain.                    Cognition Arousal/Alertness: Awake/alert Behavior During Therapy: WFL for tasks assessed/performed Overall Cognitive Status: Within Functional Limits for tasks assessed                       Exercises      General Comments        Pertinent Vitals/Pain Pain Assessment: No/denies pain    Home Living                      Prior Function            PT Goals (current goals can now be found in the care plan section) Acute Rehab PT Goals PT Goal Formulation: With patient Time For Goal Achievement: 06/10/14 Potential to Achieve Goals: Good Progress towards PT goals: Goals met and updated - see care plan    Frequency  Min 3X/week    PT Plan Current plan remains appropriate    Co-evaluation             End of Session Equipment Utilized During Treatment: Gait belt;Oxygen Activity Tolerance: Patient limited by fatigue Patient left: in chair;with call bell/phone within reach;with family/visitor present     Time: 1000-1029 PT Time Calculation (min): 29 min  Charges:  $Gait Training: 23-37 mins                    G Codes:      Trenae Brunke 2014/06/19, 10:47 AM  Suanne Marker PT [phone]

## 2014-06-03 NOTE — Progress Notes (Signed)
Left upper extremity venous duplex completed:  No evidence of DVT.  There appears to be superficial thrombus in the left cephalic vein in the upper arm.

## 2014-06-03 NOTE — Progress Notes (Signed)
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 Admitted and proved refractory to NIMVS, steroids, abx and at request of family was [facility] after [facility]ubation.   Pulm - Elizabeth AloeHenderson, was on BiPAP at home X 3y, pressure decreased to 14/9 since could not tolerate high pressure.  Pt wants to follow up with Elizabeth [name] post discharge.    STUDIES:   SIGNIFICANT EVENTS: 8/01 - [facility] - failed vent wean 8/04 - extubated 8/06 - No distress, on floor.  Requesting to follow up with Elizabeth [name]. 8/7 considering rehabilitation 8/7 PCCM will sign off..   SUBJECTIVE: Reports mild dyspnea at rest.  O2 at baseline requirements. Tolerating nebs and flovent fine (staff MD note)    VITAL SIGNS: Temp:  [98 F (36.7 C)-98.5 F (36.9 C)] 98 F (36.7 C) (08/07 0326) Pulse Rate:  [93-114] 95 (08/07 0326) Resp:  [16-19] 16 (08/07 0326) BP: (93-122)/(50-78) 122/78 mmHg (08/07 0326) SpO2:  [94 %-98 %] 94 % (08/07 0721) Weight:  [135 lb 12.8 oz (61.598 kg)] 135 lb 12.8 oz (61.598 kg) (08/07 0326)  INTAKE / OUTPUT:  Intake/Output Summary (Last 24 hours) at 06/03/14 1011 Last data filed at 06/02/14 2200  Gross per 24 hour  Intake    240 ml  Output      0 ml  Net    240 ml    PHYSICAL EXAMINATION: General:  wdwn female in NAD,lying in bed on O2. Reports feeling weak. Neuro:  Awake, alert, non focal HEENT:   No JVD Cardiovascular:  HSR RRR Lungs: Diminished  bilaterally, no wheezing Abdomen:  Soft NT + bs Musculoskeletal:  Intact Skin:  Warm and without edema  LABS:  CBC  Recent Labs Lab 05/29/14 0218 05/30/14 0215  05/31/14 0250  WBC 13.9* 16.8* 15.2*  HGB 9.0* 9.0* 8.8*  HCT 30.6* 29.1* 28.1*  PLT 310 267 261   BMET  Recent Labs Lab 05/29/14 0218 05/30/14 0215 05/31/14 0250  NA 142 145 142  K 4.1 3.7 4.1  CL 105 104 101  CO2 27 31 35*  BUN 19 17 21   CREATININE 0.47* 0.37* 0.41*  GLUCOSE 149* 164* 113*   ABG  Recent Labs Lab 05/28/14 1503 05/29/14 0438 06/01/14 0255  PHART 7.453* 7.473* 7.409  PCO2ART 45.5* 40.8 66.8*  PO2ART 150.0* 164.0* 123.0*   Glucose  Recent Labs Lab 05/31/14 2301 06/01/14 0834 06/01/14 1152 06/01/14 1609 06/01/14 2124 06/02/14 0654  GLUCAP 77 81 123* 121* 83 91    Imaging No results found.   ASSESSMENT / PLAN:  PULMONARY OETT 7/30>>8/4 A: Acute on chronic hypercarbic resp failure - in setting of ES-COPD that is O2, steroid & NIMV dependent with frequent failures. ESCOPD with Acute Exacerbation OSA P:   Prednisone with slow taper, in place Azithro for anti-inflammatory effect, stop date in place Due to cost  symbicort, spiriva (for now, see below) - to DUONEB scheduled + Flovent MDI (probably cheapest option) - - tolerating fine Advance diet as tolerated Continue nocturnal Bipap, will need home settings adjusted using autoset  CARDIOVASCULAR A:  HTN P:  Lopressor 12.5mg  PO BID + PRN IV   TODAY'S SUMMARY: End stage COPD transferred from [facility] hospital for hypercarbic respiratory failure with intubation.   Extubated on 8/4 afternoon.  Follow up with Dr. Delton Coombes arranged for discharge. PCCM will sign off for now. Please call if needed.  Elizabeth [name] ACNP Adolph Pollack PCCM Pager [phone] till 3 pm If no answer page [phone] 06/03/2014, 10:14 AM   Staff note  - see my comments above  Dr. Kalman Shan, M.D., [facility].C.P Pulmonary and Critical Care Medicine Staff Physician [facility] [place] [facility] Pager: [phone], If no answer or between  15:00h - 7:00h: call [phone]  06/03/2014 6:31  PM

## 2014-06-03 NOTE — Clinical Social Work Note (Signed)
CSW was contacted by Marylene LandAngela with [facility]. Facility is able to offer patient a bed and will accept patient with letter of guarantee if [employer]BS authorization cannot be obtained. Patient and MD updated. CSW will assist with DC.   Roddie McBryant Mathieu Schloemer MSW, [place], [place], [phone]

## 2014-06-03 NOTE — Clinical Social Work Psychosocial (Signed)
Clinical Social Work Department BRIEF PSYCHOSOCIAL ASSESSMENT 06/03/2014  Patient:  ASLEE, SUCH     Account Number:  [id number]     Admit date:  05/28/2014  Clinical Social Worker:  Lovey Newcomer  Date/Time:  06/03/2014 03:58 PM  Referred by:  Physician  Date Referred:  06/03/2014 Referred for  SNF Placement   Other Referral:   Interview type:  Patient Other interview type:   Patient alert and oriented at time of assessment.    PSYCHOSOCIAL DATA Living Status:  HUSBAND Admitted from facility:   Level of care:   Primary support name:  Shanon Brow Primary support relationship to patient:  SPOUSE Degree of support available:   Support is strong.    CURRENT CONCERNS Current Concerns  Post-Acute Placement   Other Concerns:    SOCIAL WORK ASSESSMENT / PLAN CSW met with patient at bedside to complete assessment. Patient states that she is agreeable to SNF placement for short term rehab. CSW explained SNF search/placement process to patient and answered questions. Patient lives at home with her significant other who she considers to be a great support. Patient appears anxious to discharge from the hospital. CSW will assist.   Assessment/plan status:  Psychosocial Support/Ongoing Assessment of Needs Other assessment/ plan:   Complete Fl2, Fax, PASRR   Information/referral to community resources:   CSW contact information and SNF list given.    PATIENT'S/FAMILY'S RESPONSE TO PLAN OF CARE: Patient states that she plans to DC to a SNF when ready. CSW will assist as necessary.         Liz Beach MSW, [place], [place], [phone]

## 2014-06-10 ENCOUNTER — Telehealth: Payer: Self-pay | Admitting: Pulmonary Disease

## 2014-06-10 NOTE — Telephone Encounter (Signed)
Pt was recently in hospital for AECOPD and acute on chronic respiratory failure.  She apparently was previously on BiPAP 16/8 cm H2O as outpt.  She is not currently on BiPAP 14/8 cm H2O.  She questioned center staff about why this was changed.  She has f/u appointment with Dr. Sherene SiresWert on 06/13/14.  Advised to continue with current BiPAP settings, and [facility].

## 2014-06-11 ENCOUNTER — Inpatient Hospital Stay (HOSPITAL_COMMUNITY)
Admission: EM | Admit: 2014-06-11 | Discharge: 2014-06-18 | DRG: 190 | Disposition: A | Discharge status: Home Health | Payer: BC Managed Care – PPO | Attending: Internal Medicine | Admitting: Internal Medicine

## 2014-06-11 ENCOUNTER — Encounter (HOSPITAL_COMMUNITY): Payer: Self-pay | Admitting: Emergency Medicine

## 2014-06-11 ENCOUNTER — Emergency Department (HOSPITAL_COMMUNITY): Payer: BC Managed Care – PPO

## 2014-06-11 DIAGNOSIS — E785 Hyperlipidemia, unspecified: Secondary | ICD-10-CM | POA: Diagnosis present

## 2014-06-11 DIAGNOSIS — J9620 Acute and chronic respiratory failure, unspecified whether with hypoxia or hypercapnia: Secondary | ICD-10-CM

## 2014-06-11 DIAGNOSIS — Z885 Allergy status to narcotic agent status: Secondary | ICD-10-CM

## 2014-06-11 DIAGNOSIS — E43 Unspecified severe protein-calorie malnutrition: Secondary | ICD-10-CM

## 2014-06-11 DIAGNOSIS — J441 Chronic obstructive pulmonary disease with (acute) exacerbation: Secondary | ICD-10-CM | POA: Diagnosis not present

## 2014-06-11 DIAGNOSIS — Z9981 Dependence on supplemental oxygen: Secondary | ICD-10-CM

## 2014-06-11 DIAGNOSIS — J438 Other emphysema: Secondary | ICD-10-CM

## 2014-06-11 DIAGNOSIS — J449 Chronic obstructive pulmonary disease, unspecified: Secondary | ICD-10-CM

## 2014-06-11 DIAGNOSIS — I498 Other specified cardiac arrhythmias: Secondary | ICD-10-CM | POA: Diagnosis present

## 2014-06-11 DIAGNOSIS — F411 Generalized anxiety disorder: Secondary | ICD-10-CM | POA: Diagnosis present

## 2014-06-11 DIAGNOSIS — IMO0002 Reserved for concepts with insufficient information to code with codable children: Secondary | ICD-10-CM

## 2014-06-11 DIAGNOSIS — Z87891 Personal history of nicotine dependence: Secondary | ICD-10-CM

## 2014-06-11 DIAGNOSIS — E873 Alkalosis: Secondary | ICD-10-CM | POA: Diagnosis present

## 2014-06-11 DIAGNOSIS — K219 Gastro-esophageal reflux disease without esophagitis: Secondary | ICD-10-CM | POA: Diagnosis present

## 2014-06-11 DIAGNOSIS — Z881 Allergy status to other antibiotic agents status: Secondary | ICD-10-CM

## 2014-06-11 DIAGNOSIS — I10 Essential (primary) hypertension: Secondary | ICD-10-CM | POA: Diagnosis present

## 2014-06-11 DIAGNOSIS — K449 Diaphragmatic hernia without obstruction or gangrene: Secondary | ICD-10-CM | POA: Diagnosis present

## 2014-06-11 DIAGNOSIS — R0602 Shortness of breath: Secondary | ICD-10-CM | POA: Diagnosis not present

## 2014-06-11 DIAGNOSIS — R0689 Other abnormalities of breathing: Secondary | ICD-10-CM

## 2014-06-11 HISTORY — DX: Shortness of breath: R06.02

## 2014-06-11 HISTORY — DX: Chronic obstructive pulmonary disease, unspecified: J44.9

## 2014-06-11 HISTORY — DX: Hyperlipidemia, unspecified: E78.5

## 2014-06-11 HISTORY — DX: Essential (primary) hypertension: I10

## 2014-06-11 HISTORY — DX: Diaphragmatic hernia without obstruction or gangrene: K44.9

## 2014-06-11 HISTORY — DX: Acute respiratory failure, unspecified whether with hypoxia or hypercapnia: J96.00

## 2014-06-11 HISTORY — DX: Gastro-esophageal reflux disease without esophagitis: K21.9

## 2014-06-11 LAB — URINALYSIS, ROUTINE W REFLEX MICROSCOPIC
Bilirubin Urine: NEGATIVE
GLUCOSE, UA: NEGATIVE mg/dL
HGB URINE DIPSTICK: NEGATIVE
Ketones, ur: NEGATIVE mg/dL
LEUKOCYTES UA: NEGATIVE
Nitrite: NEGATIVE
PROTEIN: NEGATIVE mg/dL
SPECIFIC GRAVITY, URINE: 1.011 (ref 1.005–1.030)
UROBILINOGEN UA: 1 mg/dL (ref 0.0–1.0)
pH: 6.5 (ref 5.0–8.0)

## 2014-06-11 LAB — I-STAT VENOUS BLOOD GAS, ED
Acid-Base Excess: 18 mmol/L — ABNORMAL HIGH (ref 0.0–2.0)
Bicarbonate: 48.4 mEq/L — ABNORMAL HIGH (ref 20.0–24.0)
O2 Saturation: 94 %
TCO2: >50 mmol/L (ref 0–100)
pCO2, Ven: 93.1 mmHg (ref 45.0–50.0)
pH, Ven: 7.324 — ABNORMAL HIGH (ref 7.250–7.300)
pO2, Ven: 82 mmHg — ABNORMAL HIGH (ref 30.0–45.0)

## 2014-06-11 LAB — I-STAT CHEM 8, ED
BUN: 7 mg/dL (ref 6–23)
CALCIUM ION: 1.17 mmol/L (ref 1.12–1.23)
CHLORIDE: 86 meq/L — AB (ref 96–112)
CREATININE: 0.6 mg/dL (ref 0.50–1.10)
Glucose, Bld: 111 mg/dL — ABNORMAL HIGH (ref 70–99)
HCT: 38 % (ref 36.0–46.0)
Hemoglobin: 12.9 g/dL (ref 12.0–15.0)
Potassium: 4.4 mEq/L (ref 3.7–5.3)
Sodium: 135 mEq/L — ABNORMAL LOW (ref 137–147)
TCO2: 43 mmol/L (ref 0–100)

## 2014-06-11 LAB — CBC
HCT: 36.1 % (ref 36.0–46.0)
Hemoglobin: 10.5 g/dL — ABNORMAL LOW (ref 12.0–15.0)
MCH: 29.4 pg (ref 26.0–34.0)
MCHC: 29.1 g/dL — AB (ref 30.0–36.0)
MCV: 101.1 fL — ABNORMAL HIGH (ref 78.0–100.0)
PLATELETS: 222 10*3/uL (ref 150–400)
RBC: 3.57 MIL/uL — ABNORMAL LOW (ref 3.87–5.11)
RDW: 14.6 % (ref 11.5–15.5)
WBC: 9.3 10*3/uL (ref 4.0–10.5)

## 2014-06-11 LAB — COMPREHENSIVE METABOLIC PANEL
ALT: 51 U/L — AB (ref 0–35)
AST: 19 U/L (ref 0–37)
Albumin: 3.5 g/dL (ref 3.5–5.2)
Alkaline Phosphatase: 51 U/L (ref 39–117)
Anion gap: 6 (ref 5–15)
BUN: 9 mg/dL (ref 6–23)
CALCIUM: 9.5 mg/dL (ref 8.4–10.5)
CO2: 44 mEq/L (ref 19–32)
CREATININE: 0.44 mg/dL — AB (ref 0.50–1.10)
Chloride: 92 mEq/L — ABNORMAL LOW (ref 96–112)
GFR calc Af Amer: >90 mL/min (ref >90)
GFR calc non Af Amer: >90 mL/min (ref >90)
Glucose, Bld: 146 mg/dL — ABNORMAL HIGH (ref 70–99)
Potassium: 4.9 mEq/L (ref 3.7–5.3)
SODIUM: 142 meq/L (ref 137–147)
TOTAL PROTEIN: 6.6 g/dL (ref 6.0–8.3)
Total Bilirubin: 0.7 mg/dL (ref 0.3–1.2)

## 2014-06-11 LAB — I-STAT TROPONIN, ED: TROPONIN I, POC: 0 ng/mL (ref 0.00–0.08)

## 2014-06-11 LAB — LIPASE, BLOOD: LIPASE: 33 U/L (ref 11–59)

## 2014-06-11 LAB — I-STAT CG4 LACTIC ACID, ED: LACTIC ACID, VENOUS: 0.97 mmol/L (ref 0.5–2.2)

## 2014-06-11 MED ORDER — SODIUM CHLORIDE 0.9 % IV BOLUS (SEPSIS)
1000.0000 mL | Freq: Once | INTRAVENOUS | Status: AC
  Administered 2014-06-11: 1000 mL via INTRAVENOUS

## 2014-06-11 MED ORDER — SODIUM CHLORIDE 0.9 % IV SOLN
INTRAVENOUS | Status: DC
  Administered 2014-06-11 – 2014-06-12 (×2): via INTRAVENOUS

## 2014-06-11 NOTE — ED Provider Notes (Signed)
CSN: [id number]     Arrival date & time 06/11/14  1912 History   First MD Initiated Contact with Patient 06/11/14 1929     Chief Complaint  Patient presents with  . Shortness of Breath  . Altered Mental Status     (Consider location/radiation/quality/duration/timing/severity/associated sxs/prior Treatment) Patient is a 58 y.o. female presenting with general illness. The history is provided by the patient and a relative.  Illness Severity:  Moderate Onset quality:  Gradual Duration:  4 days Timing:  Constant Progression:  Worsening Chronicity:  Chronic Associated symptoms: shortness of breath   Associated symptoms: no chest pain, no congestion, no fever, no headaches, no myalgias, no nausea, no rhinorrhea, no vomiting and no wheezing    58 yo F with a chief complaint of fatigue shortness of breath. Patient saw her PCP 2 days ago for this was found to have 80 bicarbonate of 45. Patient was called back about the results today. And was told they were to start on Diamox. The family did not feel comfortable with this and decided to drive to and the [place] [facility] ED. Patient denies any fevers or chills. Patient denies any cough. Patient with end-stage COPD on 3 L of oxygen at home. Patient on BiPAP at night. Patient was scheduled appointment to follow with pulmonology here.  Past Medical History  Diagnosis Date  . COPD (chronic obstructive pulmonary disease)   . Hypertension   . Acute respiratory failure   . GERD (gastroesophageal reflux disease)   . Hiatal hernia   . Hyperlipidemia    History reviewed. No pertinent past surgical history. History reviewed. No pertinent family history. History  Substance Use Topics  . Smoking status: Former Smoker -- 1.00 packs/day for 30 years    Types: Cigarettes    Quit date: 05/30/2004  . Smokeless tobacco: Not on file  . Alcohol Use: No   OB History   Grav Para Term Preterm Abortions TAB SAB Ect Mult Living                 Review of Systems   Constitutional: Negative for fever and chills.  HENT: Negative for congestion and rhinorrhea.   Eyes: Negative for redness and visual disturbance.  Respiratory: Positive for shortness of breath. Negative for wheezing.   Cardiovascular: Negative for chest pain and palpitations.  Gastrointestinal: Negative for nausea and vomiting.  Genitourinary: Negative for dysuria and urgency.  Musculoskeletal: Negative for arthralgias and myalgias.  Skin: Negative for pallor and wound.  Neurological: Negative for dizziness and headaches.       Confusion      Allergies  Bactrim ds; Codeine; Morphine; and Demerol  Home Medications   Prior to Admission medications   Medication Sig Start Date End Date Taking? Authorizing Provider  alendronate (FOSAMAX) 70 MG tablet Take 70 mg by mouth every Wednesday. Take with a full glass of water on an empty stomach.   Yes Historical Provider, MD  feeding supplement, ENSURE COMPLETE, (ENSURE COMPLETE) LIQD Take 237 mLs by mouth 2 (two) times daily between meals. 06/03/14  Yes Kathlen Mody, MD  fluticasone (FLOVENT HFA) 44 MCG/ACT inhaler Inhale 2 puffs into the lungs 2 (two) times daily. 06/03/14  Yes Kathlen Mody, MD  ipratropium-albuterol (DUONEB) 0.5-2.5 (3) MG/3ML SOLN Take 3 mLs by nebulization every 6 (six) hours as needed. For shortness of breath 06/03/14  Yes Kathlen Mody, MD  LORazepam (ATIVAN) 1 MG tablet Take 1 tablet (1 mg total) by mouth every 8 (eight) hours as needed  for anxiety. 06/03/14  Yes Kathlen ModyVijaya Akula, MD  metoprolol tartrate (LOPRESSOR) 25 MG tablet Take 12.5 mg by mouth 2 (two) times daily.    Yes Historical Provider, MD  pantoprazole (PROTONIX) 40 MG tablet Take 40 mg by mouth daily.   Yes Historical Provider, MD  predniSONE (DELTASONE) 10 MG tablet Place 1 tablet (10 mg total) into feeding tube daily with breakfast. 06/12/14 06/14/14 Yes Kathlen ModyVijaya Akula, MD  simvastatin (ZOCOR) 20 MG tablet Take 20 mg by mouth daily at 6 PM.   Yes Historical Provider, MD   valACYclovir (VALTREX) 500 MG tablet Take 500 mg by mouth every morning.    Yes Historical Provider, MD   BP 119/74  Pulse 118  Temp(Src) 99.4 F (37.4 C) (Rectal)  Resp 20  SpO2 99% Physical Exam  Constitutional: She is oriented to person, place, and time. She appears well-developed and well-nourished. No distress.  HENT:  Head: Normocephalic and atraumatic.  Eyes: EOM are normal. Pupils are equal, round, and reactive to light.  Neck: Normal range of motion. Neck supple.  Cardiovascular: Normal rate and regular rhythm.  Exam reveals no gallop and no friction rub.   No murmur heard. Pulmonary/Chest: Effort normal. She has no wheezes. She has no rales.  Distant breath sounds  Abdominal: Soft. She exhibits distension. There is tenderness (TTP diffusely worst right flank, chronic per patient).  Musculoskeletal: She exhibits no edema and no tenderness.  Neurological: She is alert and oriented to person, place, and time.  Skin: Skin is warm and dry. She is not diaphoretic.  Psychiatric: She has a normal mood and affect. Her behavior is normal.    ED Course  Procedures (including critical care time) Labs Review Labs Reviewed  CBC - Abnormal; Notable for the following:    RBC 3.57 (*)    Hemoglobin 10.5 (*)    MCV 101.1 (*)    MCHC 29.1 (*)    All other components within normal limits  COMPREHENSIVE METABOLIC PANEL - Abnormal; Notable for the following:    Chloride 92 (*)    CO2 44 (*)    Glucose, Bld 146 (*)    Creatinine, Ser 0.44 (*)    ALT 51 (*)    All other components within normal limits  URINALYSIS, ROUTINE W REFLEX MICROSCOPIC - Abnormal; Notable for the following:    APPearance HAZY (*)    All other components within normal limits  I-STAT CHEM 8, ED - Abnormal; Notable for the following:    Sodium 135 (*)    Chloride 86 (*)    Glucose, Bld 111 (*)    All other components within normal limits  I-STAT VENOUS BLOOD GAS, ED - Abnormal; Notable for the following:     pH, Ven 7.324 (*)    pCO2, Ven 93.1 (*)    pO2, Ven 82.0 (*)    Bicarbonate 48.4 (*)    Acid-Base Excess 18.0 (*)    All other components within normal limits  CULTURE, EXPECTORATED SPUTUM-ASSESSMENT  LIPASE, BLOOD  BLOOD GAS, VENOUS  BLOOD GAS, ARTERIAL  BLOOD GAS, ARTERIAL  I-STAT CG4 LACTIC ACID, ED  I-STAT TROPOININ, ED    Imaging Review Dg Chest Port 1 View  06/11/2014   CLINICAL DATA:  Dizziness, shortness of breath, chest pain, history COPD, former smoker, hypertension  EXAM: PORTABLE CHEST - 1 VIEW  COMPARISON:  Portable exam 2022 hr compared 05/31/2014  FINDINGS: Normal heart size, mediastinal contours, and pulmonary vascularity.  Emphysematous changes with minimal central peribronchial thickening  consistent with COPD.  No acute infiltrate, pleural effusion or pneumothorax.  Bullous changes at lateral LEFT base unchanged.  No acute osseous findings.  IMPRESSION: COPD changes.  No acute abnormalities.   Electronically Signed   By: Ulyses Southward M.D.   On: 06/11/2014 20:32     EKG Interpretation None      MDM   Final diagnoses:  Chronic obstructive pulmonary disease with acute exacerbation  Metabolic alkalosis    57 yo F with a significant past medical history of end-stage COPD who presents with fatigue and shortness of breath. Patient with CO2 in the 90s. Bicarbonate of 44. Patient appears to have worsening or exacerbation of her chronic COPD. But with pulmonology who will admit to the hospital.    Melene Plan, MD 06/11/14 2127  Melene Plan, MD 06/12/14 [phone]

## 2014-06-11 NOTE — ED Notes (Signed)
CG-4 and VBG results reported to Dr. Jackie Pluman Floyd-resident

## 2014-06-11 NOTE — ED Notes (Addendum)
Presents from Nursing rehab facility with CO2>45, HR 125, SOB, intermittent confusion, dizziness. PT of Dr. Sherene SiresWert, recently D/C from our facility. Pt is alert and able to answer questions.  Pt refused Diamox at Facility and requested to be transferred here.  On Bipap at facility. Transferred here on O2 at 3 L

## 2014-06-12 ENCOUNTER — Encounter (HOSPITAL_COMMUNITY): Payer: Self-pay

## 2014-06-12 DIAGNOSIS — J441 Chronic obstructive pulmonary disease with (acute) exacerbation: Secondary | ICD-10-CM

## 2014-06-12 DIAGNOSIS — J449 Chronic obstructive pulmonary disease, unspecified: Secondary | ICD-10-CM

## 2014-06-12 DIAGNOSIS — J9620 Acute and chronic respiratory failure, unspecified whether with hypoxia or hypercapnia: Secondary | ICD-10-CM

## 2014-06-12 LAB — BASIC METABOLIC PANEL
Anion gap: 5 (ref 5–15)
BUN: 7 mg/dL (ref 6–23)
CALCIUM: 8.5 mg/dL (ref 8.4–10.5)
CO2: 40 mEq/L (ref 19–32)
Chloride: 99 mEq/L (ref 96–112)
Creatinine, Ser: 0.4 mg/dL — ABNORMAL LOW (ref 0.50–1.10)
Glucose, Bld: 97 mg/dL (ref 70–99)
POTASSIUM: 4.1 meq/L (ref 3.7–5.3)
SODIUM: 144 meq/L (ref 137–147)

## 2014-06-12 LAB — CBC
HCT: 32.3 % — ABNORMAL LOW (ref 36.0–46.0)
Hemoglobin: 9.4 g/dL — ABNORMAL LOW (ref 12.0–15.0)
MCH: 29.7 pg (ref 26.0–34.0)
MCHC: 29.1 g/dL — AB (ref 30.0–36.0)
MCV: 102.2 fL — ABNORMAL HIGH (ref 78.0–100.0)
Platelets: 179 10*3/uL (ref 150–400)
RBC: 3.16 MIL/uL — ABNORMAL LOW (ref 3.87–5.11)
RDW: 14.8 % (ref 11.5–15.5)
WBC: 9.9 10*3/uL (ref 4.0–10.5)

## 2014-06-12 LAB — MRSA PCR SCREENING: MRSA by PCR: NEGATIVE

## 2014-06-12 LAB — I-STAT ARTERIAL BLOOD GAS, ED
ACID-BASE EXCESS: 13 mmol/L — AB (ref 0.0–2.0)
Bicarbonate: 42.6 mEq/L — ABNORMAL HIGH (ref 20.0–24.0)
O2 SAT: 91 %
TCO2: 45 mmol/L (ref 0–100)
pCO2 arterial: 90.8 mmHg (ref 35.0–45.0)
pH, Arterial: 7.28 — ABNORMAL LOW (ref 7.350–7.450)
pO2, Arterial: 74 mmHg — ABNORMAL LOW (ref 80.0–100.0)

## 2014-06-12 LAB — MAGNESIUM: Magnesium: 1.8 mg/dL (ref 1.5–2.5)

## 2014-06-12 LAB — PHOSPHORUS: PHOSPHORUS: 2.3 mg/dL (ref 2.3–4.6)

## 2014-06-12 MED ORDER — SODIUM CHLORIDE 0.9 % IV SOLN: 250.0000 mL | INTRAVENOUS | Status: DC | PRN

## 2014-06-12 MED ORDER — HEPARIN SODIUM (PORCINE) 5000 UNIT/ML IJ SOLN
5000.0000 [IU] | Freq: Three times a day (TID) | INTRAMUSCULAR | Status: DC
  Administered 2014-06-12 – 2014-06-18 (×19): 5000 [IU] via SUBCUTANEOUS
  Filled 2014-06-12 (×23): qty 1

## 2014-06-12 MED ORDER — FLUTICASONE PROPIONATE HFA 44 MCG/ACT IN AERO
2.0000 | INHALATION_SPRAY | Freq: Two times a day (BID) | RESPIRATORY_TRACT | Status: DC
  Administered 2014-06-12: 2 via RESPIRATORY_TRACT
  Filled 2014-06-12: qty 10.6

## 2014-06-12 MED ORDER — PANTOPRAZOLE SODIUM 40 MG PO TBEC
40.0000 mg | DELAYED_RELEASE_TABLET | Freq: Every day | ORAL | Status: DC
  Administered 2014-06-12 – 2014-06-18 (×7): 40 mg via ORAL
  Filled 2014-06-12 (×8): qty 1

## 2014-06-12 MED ORDER — PREDNISONE 20 MG PO TABS
40.0000 mg | ORAL_TABLET | Freq: Every day | ORAL | Status: DC
  Administered 2014-06-12: 40 mg via ORAL
  Filled 2014-06-12 (×2): qty 2

## 2014-06-12 MED ORDER — SODIUM CHLORIDE 0.45 % IV SOLN
INTRAVENOUS | Status: DC
  Administered 2014-06-12: 13:00:00 via INTRAVENOUS
  Administered 2014-06-13 – 2014-06-14 (×2): 500 mL via INTRAVENOUS
  Administered 2014-06-14: 19:00:00 via INTRAVENOUS
  Administered 2014-06-15: 50 mL/h via INTRAVENOUS
  Administered 2014-06-17: 05:00:00 via INTRAVENOUS

## 2014-06-12 MED ORDER — IPRATROPIUM-ALBUTEROL 0.5-2.5 (3) MG/3ML IN SOLN: 3.0000 mL | RESPIRATORY_TRACT | Status: DC | PRN

## 2014-06-12 MED ORDER — IPRATROPIUM-ALBUTEROL 0.5-2.5 (3) MG/3ML IN SOLN
3.0000 mL | RESPIRATORY_TRACT | Status: DC
  Administered 2014-06-12 – 2014-06-16 (×26): 3 mL via RESPIRATORY_TRACT
  Filled 2014-06-12 (×26): qty 3

## 2014-06-12 MED ORDER — METOPROLOL TARTRATE 12.5 MG HALF TABLET
12.5000 mg | ORAL_TABLET | Freq: Two times a day (BID) | ORAL | Status: DC
  Administered 2014-06-12 – 2014-06-18 (×13): 12.5 mg via ORAL
  Filled 2014-06-12 (×16): qty 1

## 2014-06-12 MED ORDER — METHYLPREDNISOLONE SODIUM SUCC 40 MG IJ SOLR
40.0000 mg | Freq: Two times a day (BID) | INTRAMUSCULAR | Status: DC
  Administered 2014-06-12 – 2014-06-15 (×6): 40 mg via INTRAVENOUS
  Filled 2014-06-12 (×8): qty 1

## 2014-06-12 MED ORDER — ALENDRONATE SODIUM 70 MG PO TABS: 70.0000 mg | ORAL_TABLET | ORAL | Status: DC

## 2014-06-12 MED ORDER — ASPIRIN 300 MG RE SUPP
300.0000 mg | RECTAL | Status: AC
  Filled 2014-06-12: qty 1

## 2014-06-12 MED ORDER — ENSURE COMPLETE PO LIQD
237.0000 mL | Freq: Two times a day (BID) | ORAL | Status: DC
  Administered 2014-06-12 – 2014-06-18 (×13): 237 mL via ORAL

## 2014-06-12 MED ORDER — ASPIRIN 81 MG PO CHEW
324.0000 mg | CHEWABLE_TABLET | ORAL | Status: AC
  Administered 2014-06-12: 324 mg via ORAL
  Filled 2014-06-12: qty 4

## 2014-06-12 MED ORDER — SIMVASTATIN 20 MG PO TABS
20.0000 mg | ORAL_TABLET | Freq: Every day | ORAL | Status: DC
  Administered 2014-06-12 – 2014-06-17 (×6): 20 mg via ORAL
  Filled 2014-06-12 (×7): qty 1
  Filled 2014-06-12: qty 2

## 2014-06-12 MED ORDER — LORAZEPAM 0.5 MG PO TABS
0.5000 mg | ORAL_TABLET | Freq: Two times a day (BID) | ORAL | Status: DC | PRN
  Administered 2014-06-12 – 2014-06-13 (×3): 0.5 mg via ORAL
  Administered 2014-06-14: 0.25 mg via ORAL
  Administered 2014-06-14 – 2014-06-17 (×4): 0.5 mg via ORAL
  Filled 2014-06-12 (×8): qty 1

## 2014-06-12 MED ORDER — VALACYCLOVIR HCL 500 MG PO TABS
500.0000 mg | ORAL_TABLET | Freq: Every morning | ORAL | Status: DC
  Administered 2014-06-12 – 2014-06-18 (×7): 500 mg via ORAL
  Filled 2014-06-12 (×7): qty 1

## 2014-06-12 NOTE — Progress Notes (Signed)
Subjective: Stable on bilevel currently, with minimal increased wob.  adequated tv.  Objective: Vital signs in last 24 hours: Blood pressure 124/70, pulse 119, temperature 98.4 F (36.9 C), temperature source Oral, resp. rate 28, height 5\' 7"  (1.702 m), weight 60.9 kg (134 lb 4.2 oz), SpO2 97.00%.  Intake/Output from previous day: 08/15 0701 - 08/16 0700 In: 2631.7 [I.V.:2631.7] Out: 250 [Urine:250]   Physical Exam:   wd female in nad Nose and mouth covered by full face mask for bilevel] Neck without LN or TMG Chest with decreased bs, no wheezing or crackles. Cor with mild tachy, regular. abd soft, nt/nd, bs + LE without edema, no cyanosis Moves all 4 extrem, responds to questions appropriately   Lab Results:  Recent Labs  06/11/14 1929 06/11/14 2037 06/12/14 0536  WBC 9.3  --  9.9  HGB 10.5* 12.9 9.4*  HCT 36.1 38.0 32.3*  PLT 222  --  179   BMET  Recent Labs  06/11/14 1929 06/11/14 2037 06/12/14 0536  NA 142 135* 144  K 4.9 4.4 4.1  CL 92* 86* 99  CO2 44*  --  40*  GLUCOSE 146* 111* 97  BUN 9 7 7   CREATININE 0.44* 0.60 0.40*  CALCIUM 9.5  --  8.5    Studies/Results: Dg Chest Port 1 View  06/11/2014   CLINICAL DATA:  Dizziness, shortness of breath, chest pain, history COPD, former smoker, hypertension  EXAM: PORTABLE CHEST - 1 VIEW  COMPARISON:  Portable exam 2022 hr compared 05/31/2014  FINDINGS: Normal heart size, mediastinal contours, and pulmonary vascularity.  Emphysematous changes with minimal central peribronchial thickening consistent with COPD.  No acute infiltrate, pleural effusion or pneumothorax.  Bullous changes at lateral LEFT base unchanged.  No acute osseous findings.  IMPRESSION: COPD changes.  No acute abnormalities.   Electronically Signed   By: Ulyses SouthwardMark  Boles M.D.   On: 06/11/2014 20:32    Assessment/Plan:  1) Acute on chronic hypercarbic respiratory failure secondary to known severe copd.  Suspect she has never fully recovered from prior  hospitalization.  The goal is to rest her muscles of respiration with noninvasive support, and hopefully she can do well on her own over time?  -continue bilevel prn during day, and mandatory at HS -lowest flow oxygen for sat 90% when off bilevel  2) Severe copd.  The pt has no bronchospasm on exam.  Suspect this is all airtrapping with respiratory muscle fatigue.  However, agree with increased steroid dosing for now empirically since she has NO reserve at this point.  Will change to iv since her po intake is spotty on bipap.   -change prednisone to solumedrol -continue aggressive BD regimen.     Barbaraann ShareKeith M. Dwanda Tufano, M.D. 06/12/2014, 11:19 AM

## 2014-06-12 NOTE — ED Notes (Signed)
Attempted to call report

## 2014-06-12 NOTE — Progress Notes (Signed)
Critical ABG results given to Tresa EndoKelly, RN

## 2014-06-12 NOTE — Progress Notes (Signed)
ABG results not crossing over into epic because of critical values. Results are as follows:  PH 7.22 PCO2 >100 PO2 124  Results called to April, Rapid response RN who is making MD aware.   Pt placed on Bipap at this time.

## 2014-06-12 NOTE — Progress Notes (Signed)
Utilization Review Completed.Yuji Walth T8/16/2015  

## 2014-06-12 NOTE — Progress Notes (Signed)

## 2014-06-12 NOTE — H&P (Signed)
PULMONARY  / CRITICAL CARE MEDICINE HISTORY AND PHYSICAL EXAMINATION  Name: Elizabeth [name] MRN: [id number] DOB: Feb 27, 1956    ADMISSION DATE:  06/11/2014  CHIEF COMPLAINT:  Light-headedness and shortness of breath  BRIEF PATIENT DESCRIPTION: 58 year old woman with COPD on 3L, hospitalized this month in ICU with mechanical ventilation.  Returns SNF with complaints of dizziness, light-headedness, and worsening of dyspnea from baseline.   SIGNIFICANT EVENTS / STUDIES:  1. 8/15 CXR: no pneumonia, effusions, edema 2. Worsening hypercarbia in ED --> placed on BIPAP  LINES / TUBES: 1. PIV  CULTURES: 1. 8/15 sputum cx ordered  ANTIBIOTICS: 1. None  HISTORY OF PRESENT ILLNESS:   Elizabeth [name] is a 58 year old woman with COPD and chronic hypercarbic/hypoxic respiratory failure who presents with worsening symptoms of dizziness, fatigue, light-headedness, and dyspnea.  The symptoms began about 2 days ago and have worsened.  She has been at a SNF after [facility] where she was on mechanical ventilation in the ICU for respiratory failure.  She has been on a prednisone taper and is currently on 10mg  daily.  She has not been on antibiotics for the past few days.  At her SNF she has been wearing BIPAP at night.  She has been walking with a walker in PT but still feels very weak. She denies much of a cough and says she usually does not produce much sputum.  She has brought up sputum once since discharge and describes it as brown.  She denies fevers/sweats, hemoptysis, chest pain, swelling.  She endorses chronic abdominal discomfort due to reflux but denies constipation, diarrhea, and vomiting.  Since being in the ED, she has received some fluid and reports feeling much better.  Her first gas was remarkable for CO2 in the 90s but pH 7.32.  When she fell asleep, subsequent gas (not on BIPAP) showed pH 7.2 and pCO2 >100. BIPAP was thus started.    PAST MEDICAL HISTORY :  Past Medical  History  Diagnosis Date  . COPD (chronic obstructive pulmonary disease)   . Hypertension   . Acute respiratory failure   . GERD (gastroesophageal reflux disease)   . Hiatal hernia   . Hyperlipidemia     History reviewed. No pertinent past surgical history.  MEDICATIONS: Prior to Admission medications   Medication Sig Start Date End Date Taking? Authorizing Provider  alendronate (FOSAMAX) 70 MG tablet Take 70 mg by mouth every Wednesday. Take with a full glass of water on an empty stomach.   Yes Historical Provider, MD  feeding supplement, ENSURE COMPLETE, (ENSURE COMPLETE) LIQD Take 237 mLs by mouth 2 (two) times daily between meals. 06/03/14  Yes Kathlen ModyVijaya Akula, MD  fluticasone (FLOVENT HFA) 44 MCG/ACT inhaler Inhale 2 puffs into the lungs 2 (two) times daily. 06/03/14  Yes Kathlen ModyVijaya Akula, MD  ipratropium-albuterol (DUONEB) 0.5-2.5 (3) MG/3ML SOLN Take 3 mLs by nebulization every 6 (six) hours as needed. For shortness of breath 06/03/14  Yes Kathlen ModyVijaya Akula, MD  LORazepam (ATIVAN) 1 MG tablet Take 1 tablet (1 mg total) by mouth every 8 (eight) hours as needed for anxiety. 06/03/14  Yes Kathlen ModyVijaya Akula, MD  metoprolol tartrate (LOPRESSOR) 25 MG tablet Take 12.5 mg by mouth 2 (two) times daily.    Yes Historical Provider, MD  pantoprazole (PROTONIX) 40 MG tablet Take 40 mg by mouth daily.   Yes Historical Provider, MD  predniSONE (DELTASONE) 10 MG tablet Place 1 tablet (10 mg total) into feeding tube daily with  breakfast. 06/12/14 06/14/14 Yes Kathlen Mody, MD  simvastatin (ZOCOR) 20 MG tablet Take 20 mg by mouth daily at 6 PM.   Yes Historical Provider, MD  valACYclovir (VALTREX) 500 MG tablet Take 500 mg by mouth every morning.    Yes Historical Provider, MD    ALLERGIES: Allergies  Allergen Reactions  . Bactrim Ds [Sulfamethoxazole-Trimethoprim] Hives  . Codeine Nausea And Vomiting  . Morphine Nausea And Vomiting  . Demerol [Meperidine] Other (See Comments)    FAMILY HISTORY:  History reviewed. No  pertinent family history.    SOCIAL HISTORY:  reports that she quit smoking about 10 years ago. Her smoking use included Cigarettes. She has a 30 pack-year smoking history. She does not have any smokeless tobacco history on file. She reports that she does not drink alcohol. Her drug history is not on file.  REVIEW OF SYSTEMS:  12 point review of systems was performed, with pertinent positives and negatives as above in HPI  PHYSICAL EXAM:  Temp:  [98.7 F (37.1 C)-99.4 F (37.4 C)] 99.4 F (37.4 C) (08/15 2131) Pulse Rate:  [118-128] 118 (08/16 0024) Resp:  [18-34] 20 (08/16 0024) BP: (105-132)/(62-83) 119/74 mmHg (08/16 0024) SpO2:  [94 %-100 %] 99 % (08/16 0024)   General:  Fatigued, otherwise well appearing woman who appears older than stated age Neuro:  Oriented x4, moves all extremities, no focal deficits HEENT: Sclerae anicteric.  PERRL. Extra-occular movements intact.  No cervical lymphadenopathy Neck:  Neck supple and nontender.  Trachea midline. Cardiovascular:  Regular, tachycardic. No murmus appreciated. No JVD Lungs:  Very quiet breath sounds, prolonged expiratory phase.  No wheezes or crackles.  Abdomen:  Soft, nontender, nondistended.  Normoactive bowel sounds. No masses palpated Musculoskeletal:  Decreased muscle mass but normal tone. No joint swelling/redness.  Skin: No rashes. No lesions.   Intake/Output     08/15 0701 - 08/16 0700   I.V. 1000   Total Intake 1000   Net +1000         CLINICAL DECISION-MAKING:  CBC Recent Labs     06/11/14  1929  06/11/14  2037  WBC  9.3   --   HGB  10.5*  12.9  HCT  36.1  38.0  PLT  222   --     Coag's No results found for this basename: APTT, INR,  in the last 72 hours  BMET Recent Labs     06/11/14  1929  06/11/14  2037  NA  142  135*  K  4.9  4.4  CL  92*  86*  CO2  44*   --   BUN  9  7  CREATININE  0.44*  0.60  GLUCOSE  146*  111*    Electrolytes Recent Labs     06/11/14  1929  CALCIUM  9.5     Sepsis Markers No results found for this basename: LACTICACIDVEN, PROCALCITON, O2SATVEN,  in the last 72 hours  ABG No results found for this basename: PHART, PCO2ART, PO2ART,  in the last 72 hours VBG: 7.32 / 93 / 82 / >50 / 94  Liver Enzymes Recent Labs     06/11/14  1929  AST  19  ALT  51*  ALKPHOS  51  BILITOT  0.7  ALBUMIN  3.5    Cardiac Enzymes No results found for this basename: TROPONINI, PROBNP,  in the last 72 hours  Glucose No results found for this basename: GLUCAP,  in the last 72 hours  Imaging Dg Chest Port 1 View  06/11/2014   CLINICAL DATA:  Dizziness, shortness of breath, chest pain, history COPD, former smoker, hypertension  EXAM: PORTABLE CHEST - 1 VIEW  COMPARISON:  Portable exam 2022 hr compared 05/31/2014  FINDINGS: Normal heart size, mediastinal contours, and pulmonary vascularity.  Emphysematous changes with minimal central peribronchial thickening consistent with COPD.  No acute infiltrate, pleural effusion or pneumothorax.  Bullous changes at lateral LEFT base unchanged.  No acute osseous findings.  IMPRESSION: COPD changes.  No acute abnormalities.   Electronically Signed   By: Ulyses Southward M.D.   On: 06/11/2014 20:32    EKG: Sinus tachycardia; no ST segment abnormalities.  CXR: independently interpreted: there is hyperinflation. No effusions, infiltrates, edema  ASSESSMENT / PLAN:  Active Problems:   COPD (chronic obstructive pulmonary disease)   NEUROLOGIC A:  Somnolence and altered mental status - waxes and wanes, likely due to hypercarbia   P: Treat hypercarbia as bellow Limit sedating medications (will give 1/2 of home PRN ativan doses)  PULMONARY A:  Severe COPD - unclear if this is an acute exacerbation, given lack of sputum and cough Acute on chronic hypercarbic respiratory failure with unclear precipitant - ? steroid weaning or too much sedating meds (with ativan on med list).  From metabolic standpoint, she is compensating  appropriately.  Chronic hypoxic respiratory failure  P: Scheduled and PRN duonebs Home flovent  Increase prednisone from 10mg  daily to 40mg  daily, first dose today Getting sputum culture but not starting empiric antibiotics, as there is no clear evidence of or symptoms of infection. Low threshold to start them based on clinical data BIPAP now - per SNF settings, she's at 14/8.  Will start there and titrate as appropriate Decreasing lorazepam dose to 0.5mg  BID PRN (from 1mg  q8 PRN) - unclear if she was receiving this  CARDIOVASCULAR A:  sinus tachycardia - possibly secondary to dehydration, which is consistent with exam and history  P: Continue fluids - NS ordered @100ml /hr over 10hrs  RENAL A:  Chronic metabolic alkalosis secondary to chronic respiratory acidosis  P: Treat respiratory acidosis, fluids as above  GASTROINTESTINAL A:  GERD  P: Continue home regimen PPI  HEMATOLOGIC No acute issues  INFECTIOUS DISEASE No acute issues  ENDOCRINE No acute issues  BEST PRACTICE / DISPOSITION Level of Care:  Stepdown Consultants:  N/A Code Status:  full Diet:  Regular + supplements DVT Px:  Heparin subQ GI Px:  PPI Skin Integrity:  No issues Social / Family:  Sisters and husband aware of admission and visiting with patient  TODAY'S SUMMARY: 58 y/o with COPD, recent ICU admission, acute on chronic respiratory failure, no clear infection, but appears dry.  Treating with fluids, BIPAP, nebs, and increased steroids.   I have personally obtained a history, examined the patient, evaluated laboratory and imaging results, formulated the assessment and plan and placed orders.  Pershing Cox, MD Pulmonary and Critical Care Medicine [facility] Pager: [phone]  06/12/2014, 12:30 AM

## 2014-06-12 NOTE — Progress Notes (Signed)
Received from the emergency room with family.  On Bipap with sats in the 90"s.  Vitals stable.  Settled into bed and oriented to unit.

## 2014-06-12 NOTE — ED Notes (Signed)
Called report to Nedra HaiLee, RN unit 2C.

## 2014-06-12 NOTE — ED Provider Notes (Signed)
I saw and evaluated the patient, reviewed the resident's note and I agree with the findings and plan.   EKG Interpretation   Date/Time:  Saturday June 11 2014 20:17:40 EDT Ventricular Rate:  122 PR Interval:  118 QRS Duration: 60 QT Interval:  288 QTC Calculation: 410 R Axis:   88 Text Interpretation:  Sinus tachycardia Biatrial enlargement Abnormal ECG  Confirmed by Rubin PayorPICKERING  MD, Harrold DonathNATHAN [id number]([id number]) on 06/12/2014 12:42:33 AM     Patient with worsening shortness of breath and some confusion. He has some end-stage COPD. Worsening bicarbonate and PCO2. Will admit to pulmonary  Juliet RudeNathan R. Rubin PayorPickering, MD 06/12/14 [phone]

## 2014-06-13 ENCOUNTER — Inpatient Hospital Stay: Payer: BC Managed Care – PPO | Admitting: Internal Medicine

## 2014-06-13 DIAGNOSIS — K219 Gastro-esophageal reflux disease without esophagitis: Secondary | ICD-10-CM | POA: Diagnosis present

## 2014-06-13 DIAGNOSIS — IMO0002 Reserved for concepts with insufficient information to code with codable children: Secondary | ICD-10-CM | POA: Diagnosis not present

## 2014-06-13 DIAGNOSIS — Z9981 Dependence on supplemental oxygen: Secondary | ICD-10-CM | POA: Diagnosis not present

## 2014-06-13 DIAGNOSIS — Z885 Allergy status to narcotic agent status: Secondary | ICD-10-CM | POA: Diagnosis not present

## 2014-06-13 DIAGNOSIS — E873 Alkalosis: Secondary | ICD-10-CM | POA: Diagnosis present

## 2014-06-13 DIAGNOSIS — Z881 Allergy status to other antibiotic agents status: Secondary | ICD-10-CM | POA: Diagnosis not present

## 2014-06-13 DIAGNOSIS — J441 Chronic obstructive pulmonary disease with (acute) exacerbation: Secondary | ICD-10-CM | POA: Diagnosis present

## 2014-06-13 DIAGNOSIS — Z87891 Personal history of nicotine dependence: Secondary | ICD-10-CM | POA: Diagnosis not present

## 2014-06-13 DIAGNOSIS — K449 Diaphragmatic hernia without obstruction or gangrene: Secondary | ICD-10-CM | POA: Diagnosis present

## 2014-06-13 DIAGNOSIS — R0602 Shortness of breath: Secondary | ICD-10-CM | POA: Diagnosis present

## 2014-06-13 DIAGNOSIS — I498 Other specified cardiac arrhythmias: Secondary | ICD-10-CM | POA: Diagnosis present

## 2014-06-13 DIAGNOSIS — J9620 Acute and chronic respiratory failure, unspecified whether with hypoxia or hypercapnia: Secondary | ICD-10-CM | POA: Diagnosis present

## 2014-06-13 DIAGNOSIS — F411 Generalized anxiety disorder: Secondary | ICD-10-CM | POA: Diagnosis present

## 2014-06-13 DIAGNOSIS — I10 Essential (primary) hypertension: Secondary | ICD-10-CM | POA: Diagnosis present

## 2014-06-13 DIAGNOSIS — J438 Other emphysema: Secondary | ICD-10-CM

## 2014-06-13 DIAGNOSIS — E785 Hyperlipidemia, unspecified: Secondary | ICD-10-CM | POA: Diagnosis present

## 2014-06-13 LAB — BLOOD GAS, ARTERIAL
Acid-Base Excess: 13.9 mmol/L — ABNORMAL HIGH (ref 0.0–2.0)
Bicarbonate: 39.6 meq/L — ABNORMAL HIGH (ref 20.0–24.0)
Delivery systems: POSITIVE
Drawn by: 36277
Expiratory PAP: 8
FIO2: 0.3 %
Inspiratory PAP: 14
O2 Saturation: 97.8 %
Patient temperature: 98.6
RATE: 20 {breaths}/min
TCO2: 41.7 mmol/L (ref 0–100)
pCO2 arterial: 68.7 mmHg (ref 35.0–45.0)
pH, Arterial: 7.379 (ref 7.350–7.450)
pO2, Arterial: 96 mmHg (ref 80.0–100.0)

## 2014-06-13 LAB — BASIC METABOLIC PANEL WITH GFR
Anion gap: 3 — ABNORMAL LOW (ref 5–15)
BUN: 6 mg/dL (ref 6–23)
CO2: 41 meq/L (ref 19–32)
Calcium: 8.7 mg/dL (ref 8.4–10.5)
Chloride: 98 meq/L (ref 96–112)
Creatinine, Ser: 0.42 mg/dL — ABNORMAL LOW (ref 0.50–1.10)
GFR calc Af Amer: >90 mL/min
GFR calc non Af Amer: >90 mL/min
Glucose, Bld: 94 mg/dL (ref 70–99)
Potassium: 4.3 meq/L (ref 3.7–5.3)
Sodium: 142 meq/L (ref 137–147)

## 2014-06-13 NOTE — Progress Notes (Signed)
CSW notified by [place] that patient was admitted from [facility]. CSW met with patient and her husband this afternoon to offer support and assistance with d/c needs. Patient states that she does not plan to return home at d/c and will return home with her husband. He is in full agreement and stated that he does work several days a week but patient's sister is available to stay with her when he is at work.  Patient already has 02 at home and states that she would consider return to [facility] later if she does not manage at home. Above information relayed to [facility] and notified facility.  Per patient and her husband- they have spoken to [place], Admissions at the [facility] who indicated that patient can return to facility if needed and has a 30 day window at home to decide if home is the appropriate place. Patient will accept home health at d/c.  No further CSW needs identified at this. CSW will sign off but will be available as needed to assist patient/husband  Elizabeth [name], [place]

## 2014-06-13 NOTE — Progress Notes (Signed)
Subjective: Remains on BiPAP this AM.  No new complaints.  Objective: Vital signs in last 24 hours: Blood pressure 105/67, pulse 119, temperature 97.6 F (36.4 C), temperature source Axillary, resp. rate 22, height 5\' 7"  (1.702 m), weight 136 lb 14.5 oz (62.1 kg), SpO2 96.00%.  Intake/Output from previous day: 08/16 0701 - 08/17 0700 In: 1362.5 [I.V.:1362.5] Out: 575 [Urine:575]  Physical Exam:   wd female in nad Nose and mouth covered by full face mask for bilevel] Neck without LN or TMG Chest with decreased bs, no wheezing or crackles. Cor with mild tachy, regular. abd soft, nt/nd, bs + LE without edema, no cyanosis Moves all 4 extrem, responds to questions appropriately   Lab Results:  Recent Labs  06/11/14 1929 06/11/14 2037 06/12/14 0536  WBC 9.3  --  9.9  HGB 10.5* 12.9 9.4*  HCT 36.1 38.0 32.3*  PLT 222  --  179   BMET  Recent Labs  06/11/14 1929 06/11/14 2037 06/12/14 0536 06/13/14 0329  NA 142 135* 144 142  K 4.9 4.4 4.1 4.3  CL 92* 86* 99 98  CO2 44*  --  40* 41*  GLUCOSE 146* 111* 97 94  BUN 9 7 7 6   CREATININE 0.44* 0.60 0.40* 0.42*  CALCIUM 9.5  --  8.5 8.7   Studies/Results: Dg Chest Port 1 View  06/11/2014   CLINICAL DATA:  Dizziness, shortness of breath, chest pain, history COPD, former smoker, hypertension  EXAM: PORTABLE CHEST - 1 VIEW  COMPARISON:  Portable exam 2022 hr compared 05/31/2014  FINDINGS: Normal heart size, mediastinal contours, and pulmonary vascularity.  Emphysematous changes with minimal central peribronchial thickening consistent with COPD.  No acute infiltrate, pleural effusion or pneumothorax.  Bullous changes at lateral LEFT base unchanged.  No acute osseous findings.  IMPRESSION: COPD changes.  No acute abnormalities.   Electronically Signed   By: Ulyses SouthwardMark  Boles M.D.   On: 06/11/2014 20:32    Assessment/Plan:  1) Acute on chronic hypercarbic respiratory failure secondary to known severe copd.  Suspect she has never fully  recovered from prior hospitalization.  The goal is to rest her muscles of respiration with noninvasive support, and hopefully she can do well on her own over time?  - Change BiPAP to PRN during the day and mandatory QHS for now. - Titrate O2 down for sat of 88-90% given severe COPD.  2) Severe copd.  The pt has no bronchospasm on exam.  Suspect this is all airtrapping with respiratory muscle fatigue.  However, agree with increased steroid dosing for now empirically since she has NO reserve at this point.  Will change to iv since her po intake is spotty on bipap.   - Continue solumedrol at 40 mg IV q12 hours for now. - Continue aggressive BD regimen. - Will attempt to titrate steroids down over the next few days then switch to inhaled LABA/steroids later this week.  Alyson ReedyWesam G. Mykaila Blunck, M.D. [facility] Pulmonary/Critical Care Medicine. Pager: [phone]. After hours pager: [phone].

## 2014-06-14 DIAGNOSIS — R0989 Other specified symptoms and signs involving the circulatory and respiratory systems: Secondary | ICD-10-CM

## 2014-06-14 DIAGNOSIS — R0609 Other forms of dyspnea: Secondary | ICD-10-CM

## 2014-06-14 LAB — CBC
HCT: 31.3 % — ABNORMAL LOW (ref 36.0–46.0)
Hemoglobin: 9.5 g/dL — ABNORMAL LOW (ref 12.0–15.0)
MCH: 30.4 pg (ref 26.0–34.0)
MCHC: 30.4 g/dL (ref 30.0–36.0)
MCV: 100 fL (ref 78.0–100.0)
Platelets: 205 10*3/uL (ref 150–400)
RBC: 3.13 MIL/uL — ABNORMAL LOW (ref 3.87–5.11)
RDW: 15.4 % (ref 11.5–15.5)
WBC: 8.3 10*3/uL (ref 4.0–10.5)

## 2014-06-14 LAB — BASIC METABOLIC PANEL
ANION GAP: 5 (ref 5–15)
BUN: 8 mg/dL (ref 6–23)
CO2: 40 mEq/L (ref 19–32)
Calcium: 9 mg/dL (ref 8.4–10.5)
Chloride: 100 mEq/L (ref 96–112)
Creatinine, Ser: 0.41 mg/dL — ABNORMAL LOW (ref 0.50–1.10)
GFR calc Af Amer: >90 mL/min (ref >90)
Glucose, Bld: 126 mg/dL — ABNORMAL HIGH (ref 70–99)
POTASSIUM: 5.1 meq/L (ref 3.7–5.3)
Sodium: 145 mEq/L (ref 137–147)

## 2014-06-14 MED ORDER — MENTHOL 3 MG MT LOZG
1.0000 | LOZENGE | OROMUCOSAL | Status: DC | PRN
  Administered 2014-06-14: 3 mg via ORAL
  Filled 2014-06-14: qty 9

## 2014-06-14 NOTE — Progress Notes (Addendum)
Subjective:  Minimal BiPAP requirements 8/17 during day. No issues on BiPAP overnight.   Objective: Vital signs in last 24 hours: Blood pressure 117/87, pulse 103, temperature 97.6 F (36.4 C), temperature source Oral, resp. rate 22, height 5\' 7"  (1.702 m), weight 63.3 kg (139 lb 8.8 oz), SpO2 98.00%.  Intake/Output from previous day: 08/17 0701 - 08/18 0700 In: 1950 [P.O.:1050; I.V.:900] Out: 2601 [Urine:2600; Stool:1]  Physical Exam:  Female in NAD on BiPAP Nose and mouth covered by full face mask for bilevel Neck without LN or TMG Chest with decreased bs, no wheezing or crackles. Tachy, regular. No MRG noted abd soft, nt/nd, bs + LE without edema, no cyanosis Moves all 4 extrem, responds to questions appropriately   Lab Results:  Recent Labs  06/11/14 1929 06/11/14 2037 06/12/14 0536  WBC 9.3  --  9.9  HGB 10.5* 12.9 9.4*  HCT 36.1 38.0 32.3*  PLT 222  --  179   BMET  Recent Labs  06/11/14 1929 06/11/14 2037 06/12/14 0536 06/13/14 0329  NA 142 135* 144 142  K 4.9 4.4 4.1 4.3  CL 92* 86* 99 98  CO2 44*  --  40* 41*  GLUCOSE 146* 111* 97 94  BUN 9 7 7 6   CREATININE 0.44* 0.60 0.40* 0.42*  CALCIUM 9.5  --  8.5 8.7   Studies/Results: No results found.  Assessment/Plan:  1) Acute on chronic hypercarbic respiratory failure secondary to known severe copd.  Suspect she has never fully recovered from prior hospitalization.   - Change BiPAP to completely PRN, Goal to use [place] all day.  - Titrate O2 down for sat of 88-90% given severe COPD. - Recheck labs 8/19  2) Severe COPD  - Continue solumedrol 40 mg BID - Continue aggressive BD regimen. - Will attempt to titrate steroids down over the next few days then switch to inhaled LABA/steroids later this week.  - Her plan is to return home with her husband even if she must do so on BiPAP. Hope that she will not need this by time of discharge. Does not want to return to SNF.   Joneen RoachPaul Hoffman, ACNP [place]  Pulmonology/Critical Care Pager [phone] or [phone]  Patient seen and examined, agree with above note.  I dictated the care and orders written for this patient under my direction.  Alyson ReedyWesam G Yacoub, MD [phone]

## 2014-06-15 MED ORDER — METHYLPREDNISOLONE SODIUM SUCC 40 MG IJ SOLR
20.0000 mg | Freq: Two times a day (BID) | INTRAMUSCULAR | Status: DC
  Administered 2014-06-15 – 2014-06-17 (×5): 20 mg via INTRAVENOUS
  Filled 2014-06-15 (×6): qty 0.5

## 2014-06-15 NOTE — Progress Notes (Signed)
Subjective: Using nocturnal bipap.   Objective: Vital signs in last 24 hours: Blood pressure 124/77, pulse 116, temperature 98.4 F (36.9 C), temperature source Oral, resp. rate 18, height 5\' 7"  (1.702 m), weight 137 lb 5.6 oz (62.3 kg), SpO2 97.00%.  Intake/Output from previous day: 08/18 0701 - 08/19 0700 In: 1430 [P.O.:480; I.V.:950] Out: 1801 [Urine:1800; Stool:1]  Physical Exam:  Female in NAD off BiPAP Neck without LN or TMG Chest with decreased bs, no wheezing or crackles. Tachy, regular. No MRG noted abd soft, nt/nd, bs + LE without edema, no cyanosis Moves all 4 extrem, responds to questions appropriately   Lab Results:  Recent Labs  06/14/14 0530  WBC 8.3  HGB 9.5*  HCT 31.3*  PLT 205   BMET  Recent Labs  06/13/14 0329 06/14/14 0530  NA 142 145  K 4.3 5.1  CL 98 100  CO2 41* 40*  GLUCOSE 94 126*  BUN 6 8  CREATININE 0.42* 0.41*  CALCIUM 8.7 9.0   Studies/Results: No results found.  Assessment/Plan:  1) Acute on chronic hypercarbic respiratory failure secondary to known severe copd.  Suspect she has never fully recovered from prior hospitalization.   - Change BiPAP to nocturnal. She may need bipap nocturnally - Titrate O2 down for sat of 88-90% given severe COPD. - Recheck labs 8/20  2) Severe COPD  - Continue solumedrol 40 mg BID - Continue aggressive BD regimen. - Will attempt to titrate steroids down over the next few days then switch to inhaled LABA/steroids later this week.  - Her plan is to return home with her husband even if she must do so on BiPAP. Suspect she will need home bipap.   Brett CanalesSteve Minor ACNP [facility] Pager [phone] till 3 pm If no answer page [phone] 06/15/2014, 11:23 AM   Patient seen and examined, agree with above note.  I dictated the care and orders written for this patient under my direction.  Alyson ReedyWesam G Lorelei Heikkila, MD [phone]

## 2014-06-16 ENCOUNTER — Inpatient Hospital Stay (HOSPITAL_COMMUNITY): Payer: BC Managed Care – PPO

## 2014-06-16 LAB — BASIC METABOLIC PANEL
BUN: 12 mg/dL (ref 6–23)
CALCIUM: 9.5 mg/dL (ref 8.4–10.5)
CO2: >45 mEq/L (ref 19–32)
Chloride: 93 mEq/L — ABNORMAL LOW (ref 96–112)
Creatinine, Ser: 0.49 mg/dL — ABNORMAL LOW (ref 0.50–1.10)
GFR calc Af Amer: >90 mL/min (ref >90)
GFR calc non Af Amer: >90 mL/min (ref >90)
Glucose, Bld: 106 mg/dL — ABNORMAL HIGH (ref 70–99)
Potassium: 4.9 mEq/L (ref 3.7–5.3)
SODIUM: 141 meq/L (ref 137–147)

## 2014-06-16 LAB — CBC
HCT: 33.2 % — ABNORMAL LOW (ref 36.0–46.0)
Hemoglobin: 9.8 g/dL — ABNORMAL LOW (ref 12.0–15.0)
MCH: 29.6 pg (ref 26.0–34.0)
MCHC: 29.5 g/dL — ABNORMAL LOW (ref 30.0–36.0)
MCV: 100.3 fL — ABNORMAL HIGH (ref 78.0–100.0)
PLATELETS: 268 10*3/uL (ref 150–400)
RBC: 3.31 MIL/uL — AB (ref 3.87–5.11)
RDW: 15.6 % — ABNORMAL HIGH (ref 11.5–15.5)
WBC: 9.5 10*3/uL (ref 4.0–10.5)

## 2014-06-16 MED ORDER — LORAZEPAM 1 MG PO TABS: 0.5000 mg | ORAL_TABLET | Freq: Two times a day (BID) | ORAL | Status: DC | PRN

## 2014-06-16 MED ORDER — ARFORMOTEROL TARTRATE 15 MCG/2ML IN NEBU
15.0000 ug | INHALATION_SOLUTION | Freq: Two times a day (BID) | RESPIRATORY_TRACT | Status: DC
  Administered 2014-06-16 – 2014-06-18 (×5): 15 ug via RESPIRATORY_TRACT
  Filled 2014-06-16 (×9): qty 2

## 2014-06-16 MED ORDER — ALBUTEROL SULFATE (2.5 MG/3ML) 0.083% IN NEBU
2.5000 mg | INHALATION_SOLUTION | RESPIRATORY_TRACT | Status: DC | PRN
  Administered 2014-06-16 – 2014-06-18 (×6): 2.5 mg via RESPIRATORY_TRACT
  Filled 2014-06-16 (×6): qty 3

## 2014-06-16 MED ORDER — BUDESONIDE 0.5 MG/2ML IN SUSP
0.5000 mg | Freq: Two times a day (BID) | RESPIRATORY_TRACT | Status: DC
  Administered 2014-06-16 – 2014-06-18 (×5): 0.5 mg via RESPIRATORY_TRACT
  Filled 2014-06-16 (×7): qty 2

## 2014-06-16 MED ORDER — TIOTROPIUM BROMIDE MONOHYDRATE 18 MCG IN CAPS
18.0000 ug | ORAL_CAPSULE | Freq: Every day | RESPIRATORY_TRACT | Status: DC
  Administered 2014-06-16 – 2014-06-18 (×3): 18 ug via RESPIRATORY_TRACT
  Filled 2014-06-16: qty 5

## 2014-06-16 NOTE — Progress Notes (Signed)
Subjective: Had a hard time w/ nocturnal BIPAP mask last night   Objective: Vital signs in last 24 hours: Blood pressure 104/73, pulse 100, temperature 97.8 F (36.6 C), temperature source Axillary, resp. rate 20, height 5\' 7"  (1.702 m), weight 61 kg (134 lb 7.7 oz), SpO2 99.00%.  Intake/Output from previous day: 08/19 0701 - 08/20 0700 In: 2110 [P.O.:960; I.V.:1150] Out: 2170 [Urine:2170]  Physical Exam:  Female in NAD off BiPAP, speaking full sentences  Neck without LN or TMG Chest with decreased bs, no wheezing or crackles. Tachy, regular. No MRG noted abd soft, nt/nd, bs + LE without edema, no cyanosis Moves all 4 extrem, responds to questions appropriately   Lab Results:  Recent Labs  06/14/14 0530 06/16/14 0315  WBC 8.3 9.5  HGB 9.5* 9.8*  HCT 31.3* 33.2*  PLT 205 268   BMET  Recent Labs  06/14/14 0530 06/16/14 0315  NA 145 141  K 5.1 4.9  CL 100 93*  CO2 40* >45*  GLUCOSE 126* 106*  BUN 8 12  CREATININE 0.41* 0.49*  CALCIUM 9.0 9.5   Studies/Results: Dg Chest Port 1 View  06/16/2014   CLINICAL DATA:  Respiratory failure  EXAM: PORTABLE CHEST - 1 VIEW  COMPARISON:  Prior chest x-ray 06/11/2014  FINDINGS: Metallic artifact overlies the chest than the formal multiple cardiac leads. The cardiac and mediastinal contours remain unchanged. Lungs are hyperinflated with areas of emphysematous change and mild central bronchitic change. Bullous emphysema noted in the lower lobes. No focal airspace consolidation, pleural effusion or pneumothorax. No acute osseous abnormality.  IMPRESSION: 1. No acute cardiopulmonary process. 2. Advanced emphysema with lower lobe bullous change.   Electronically Signed   By: Malachy MoanHeath  McCullough M.D.   On: 06/16/2014 07:36    Assessment/Plan:  Acute on chronic hypercarbic respiratory failure secondary to known severe copd.  Suspect she has never fully recovered from prior hospitalization. Feeling better.   -cont BiPAP @ HS. She  already has BIPAP machine at home. Have asked her to brink in mask  - Titrate O2 down for sat of 88-90% given severe COPD. -resume spiriva, start brovana/budesonide (in place of symbicort) - Continue solumedrol 40 mg BID - pulm hygiene    - Her plan is to return home with her husband even if she must do so on BiPAP. Has BIPAP at home. Likely ready for d/c next 48hrs   Simonne MartinetPeter E Babcock, NP  Anticipate D/C in AM if remains stable with home BiPAP.  Patient seen and examined, agree with above note.  I dictated the care and orders written for this patient under my direction.  Alyson ReedyWesam G Yacoub, MD [phone]

## 2014-06-16 NOTE — Progress Notes (Signed)
CRITICAL VALUE ALERT  Critical value received: CO2 >45  Date of notification:  06/16/2014   Time of notification:  4:51 AM   Critical value read back: yes  Nurse who received alert:  Birdena CrandallAmanda Abrielle Finck RN  MD notified (1st page):    Time of first page:  [phone]  MD notified (2nd page):  Time of second page:  Responding MD:    Time MD responded:  [phone]

## 2014-06-16 NOTE — Discharge Summary (Signed)
Physician Discharge Summary  Patient ID: Elizabeth [name] MRN: [id number] DOB/AGE: Oct 10, 1956 58 y.o.  Admit date: 06/11/2014 Discharge date: 06/18/2014    Discharge Diagnoses:  Active Problems:   COPD (chronic obstructive pulmonary disease)    Brief Summary: Elizabeth [name] is a 58 y.o. 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  She returned 8/16 with worsening SOB, dizziness, fatigue.  She was hypercarbic on admit with PCO2 in the 90's but was fairly compensated with pH 7.32 and was placed on bipap.  She was treated with IV steroids, BD's, anxiety control and continued on bipap.  She did not require abx as she just completed a full course a few days prior to admit and did not have any obvious infectious etiology.  She improved slowly and was able to transition to qhs bipap as she was on prior.  She declined d/c back to SNF and wants to go home, although she will continue to consider a transition back to SNF if she does not feel she is doing well at home.  She is nearing baseline resp status and is ready for d/c pending close outpt pulm f/u.     Consults: none  Lines/tubes: none  Microbiology/Sepsis markers: none  Significant Diagnostic Studies:  none                                                                    D/c plan by Discharge Diagnosis  Acute on chronic hypercarbic respiratory failure secondary to known severe copd D/c plan --  qhs bipap ipap 14, epap 8 -- has this set up at home  Continuous O2 as prior  Continue spiriva, bronana, budesonide Prednisone with taper back to ?baseline 40m -- need to determine if she needs chronic pred Refuses SNF - will return home with husband and cont to consider SNF as outpt  Consider more symptom management/comfort based approach - 3 admits since July in pt with ESCOPD,  ??consider hospice  Hx HTN  Hyperlipidemia  D/c plan --  Cont lopressor, zocor    Filed Vitals:   06/17/14 1500 06/17/14 2100 06/17/14 2321 06/18/14 0525  BP: 110/59 101/69  117/73  Pulse: 111 121 104 108  Temp: 98.1 F (36.7 C) 98.2 F (36.8 C)  97.8 F (36.6 C)  TempSrc: Oral Oral  Oral  Resp: _0 Height:      Weight:    126 lb 15.8 oz (57.6 kg)  SpO2: 98% 95% 96% 97%     Discharge Labs  BMET  Recent Labs Lab 06/11/14 1929 06/11/14 2037 06/12/14 0536 06/13/14 0329 06/14/14 0530 06/16/14 0315  NA 142 135* 144 142 145 141  K 4.9 4.4 4.1 4.3 5.1 4.9  CL 92* 86* 99 98 100 93*  CO2 44*  --  40* 41* 40* >45*  GLUCOSE 146* 111* 97 94 126* 106*  BUN _1 CREATININE 0.44* 0.60 0.40* 0.42* 0.41* 0.49*  CALCIUM 9.5  --  8.5 8.7 9.0 9.5  MG  --   --  1.8  --   --   --  PHOS  --   --  2.3  --   --   --      CBC   Recent Labs Lab 06/12/14 0536 06/14/14 0530 06/16/14 0315  HGB 9.4* 9.5* 9.8*  HCT 32.3* 31.3* 33.2*  WBC 9.9 8.3 9.5  PLT 179 205 268   Anti-Coagulation No results found for this basename: INR,  in the last 168 hours   Discharge Instructions   Discharge instructions    Complete by:  As directed   Wear bipap EVERY night  Continuous Oxygen 2-3L nasal cannula              Follow-up Information   Follow up with PARRETT,TAMMY, NP On 06/27/2014. (2:30pm )    Specialty:  Nurse Practitioner   Contact information:   [address] [place] [phone]          Medication List    STOP taking these medications       albuterol 108 (90 BASE) MCG/ACT inhaler  Commonly known as:  PROVENTIL HFA;VENTOLIN HFA  Replaced by:  albuterol (2.5 MG/3ML) 0.083% nebulizer solution     azelastine 0.1 % nasal spray  Commonly known as:  ASTELIN     fluticasone 44 MCG/ACT inhaler  Commonly known as:  FLOVENT HFA     ipratropium-albuterol 0.5-2.5 (3) MG/3ML Soln  Commonly known as:  DUONEB      TAKE these  medications       albuterol (2.5 MG/3ML) 0.083% nebulizer solution  Commonly known as:  PROVENTIL  Take 3 mLs (2.5 mg total) by nebulization every 4 (four) hours as needed for shortness of breath.     alendronate 70 MG tablet  Commonly known as:  FOSAMAX  Take 70 mg by mouth every Wednesday. Take with a full glass of water on an empty stomach.     arformoterol 15 MCG/2ML Nebu  Commonly known as:  BROVANA  Take 2 mLs (15 mcg total) by nebulization 2 (two) times daily.     budesonide 0.5 MG/2ML nebulizer solution  Commonly known as:  PULMICORT  Take 2 mLs (0.5 mg total) by nebulization 2 (two) times daily.     feeding supplement (ENSURE COMPLETE) Liqd  Take 237 mLs by mouth 2 (two) times daily between meals.     LORazepam 1 MG tablet  Commonly known as:  ATIVAN  Take 0.5 tablets (0.5 mg total) by mouth 2 (two) times daily as needed for anxiety.     metoprolol tartrate 25 MG tablet  Commonly known as:  LOPRESSOR  Take 12.5 mg by mouth 2 (two) times daily.     pantoprazole 40 MG tablet  Commonly known as:  PROTONIX  Take 40 mg by mouth daily.     predniSONE 10 MG tablet  Commonly known as:  DELTASONE  Take 4 tabs PO daily x 3 days, then 3 tabs PO daily x 3 days, then 2 tabs PO daily x 3 days, then 1 tab daily until seen in pulmonary office     simvastatin 20 MG tablet  Commonly known as:  ZOCOR  Take 20 mg by mouth daily at 6 PM.     tiotropium 18 MCG inhalation capsule  Commonly known as:  SPIRIVA  Place 1 capsule (18 mcg total) into inhaler and inhale daily.     valACYclovir 500 MG tablet  Commonly known as:  VALTREX  Take 500 mg by mouth every morning.          Disposition:  home   Discharged Condition: Elizabeth [name] has met maximum benefit of inpatient care and is medically stable and cleared for discharge.  Patient is pending follow up as above.      Time spent on disposition:  Greater than 35 minutes.   SignedDarlina Sicilian, NP 06/18/2014  1:05  PM Pager: [phone] or [phone]  *Care during the described time interval was provided by me and/or other providers on the critical care team. I have reviewed this patient's available data, including medical history, events of note, physical examination and test results as part of my evaluation. I independently examined this patient and discussed plans. I agree with the plans outlined above.   CD Annamaria Boots, MD, PCCM

## 2014-06-16 NOTE — Discharge Instructions (Signed)

## 2014-06-17 ENCOUNTER — Encounter (HOSPITAL_COMMUNITY): Payer: Self-pay | Admitting: General Practice

## 2014-06-17 DIAGNOSIS — E43 Unspecified severe protein-calorie malnutrition: Secondary | ICD-10-CM

## 2014-06-17 MED ORDER — PREDNISONE 20 MG PO TABS
40.0000 mg | ORAL_TABLET | Freq: Every day | ORAL | Status: DC
  Administered 2014-06-18: 40 mg via ORAL
  Filled 2014-06-17 (×2): qty 2

## 2014-06-17 NOTE — Progress Notes (Signed)
Subjective: Had a good night. Anxious about going home. Wanted to stay in SDU but we agreed that a night in med-surg would be a good step to build her confidence and comfort about being at home.   Objective: Vital signs in last 24 hours: Blood pressure 104/74, pulse 105, temperature 97.7 F (36.5 C), temperature source Axillary, resp. rate 20, height 5\' 7"  (1.702 m), weight 63.3 kg (139 lb 8.8 oz), SpO2 98.00%. 3 liters  Intake/Output from previous day: 08/20 0701 - 08/21 0700 In: 1130 [P.O.:580; I.V.:550] Out: 1825 [Urine:1825]  Physical Exam:  Female in NAD off BiPAP, speaking full sentences  Neck without LN or TMG Chest with decreased bs, no wheezing or crackles. Tachy, regular. No MRG noted abd soft, nt/nd, bs + LE without edema, no cyanosis Moves all 4 extrem, responds to questions appropriately   Lab Results:  Recent Labs  06/16/14 0315  WBC 9.5  HGB 9.8*  HCT 33.2*  PLT 268   BMET  Recent Labs  06/16/14 0315  NA 141  K 4.9  CL 93*  CO2 >45*  GLUCOSE 106*  BUN 12  CREATININE 0.49*  CALCIUM 9.5   Studies/Results: Dg Chest Port 1 View  06/16/2014   CLINICAL DATA:  Respiratory failure  EXAM: PORTABLE CHEST - 1 VIEW  COMPARISON:  Prior chest x-ray 06/11/2014  FINDINGS: Metallic artifact overlies the chest than the formal multiple cardiac leads. The cardiac and mediastinal contours remain unchanged. Lungs are hyperinflated with areas of emphysematous change and mild central bronchitic change. Bullous emphysema noted in the lower lobes. No focal airspace consolidation, pleural effusion or pneumothorax. No acute osseous abnormality.  IMPRESSION: 1. No acute cardiopulmonary process. 2. Advanced emphysema with lower lobe bullous change.   Electronically Signed   By: Malachy MoanHeath  McCullough M.D.   On: 06/16/2014 07:36    Assessment/Plan:  Acute on chronic hypercarbic respiratory failure secondary to known severe copd.  Suspect she has never fully recovered from prior  hospitalization. Feeling better.   -cont BiPAP @ HS. She already has BIPAP machine at home. Have asked her to brink in mask  - Titrate O2 down for sat of 88-90% given severe COPD. -resume spiriva, start brovana/budesonide (in place of symbicort)--> have asked CM to check on out-of-pocket cost. If the nebs are more expensive then we will just go back to symbicort.  - change solumedrol to pred - pulm hygiene  - move to floor. IF does ok, she can go home in am    - Her plan is to return home with her husband even if she must do so on BiPAP. Has BIPAP at home. Likely ready for d/c next 24  Simonne MartinetPeter E Babcock, NP  Patient seen and examined, agree with above note.  I dictated the care and orders written for this patient under my direction.  Alyson ReedyWesam G Yacoub, MD [phone]

## 2014-06-17 NOTE — Progress Notes (Signed)
Patient placed on BiPap for HS.  14/6 with 2L bleed in. FFM used.  Patient is uncomfortable with the mask she has been given by her home health company, a full face mask with nasal pillows.  Patient was advised to contact home health company and request a regular full face mask, as a comfortable mask with help her be more compliant with BiPap QHS.  BBS diminished.

## 2014-06-18 MED ORDER — TIOTROPIUM BROMIDE MONOHYDRATE 18 MCG IN CAPS: 18.0000 ug | ORAL_CAPSULE | Freq: Every day | RESPIRATORY_TRACT | Status: DC

## 2014-06-18 MED ORDER — BUDESONIDE 0.5 MG/2ML IN SUSP: 0.5000 mg | Freq: Two times a day (BID) | RESPIRATORY_TRACT | Status: AC

## 2014-06-18 MED ORDER — ARFORMOTEROL TARTRATE 15 MCG/2ML IN NEBU: 15.0000 ug | INHALATION_SOLUTION | Freq: Two times a day (BID) | RESPIRATORY_TRACT | Status: DC

## 2014-06-18 MED ORDER — ALBUTEROL SULFATE (2.5 MG/3ML) 0.083% IN NEBU: 2.5000 mg | INHALATION_SOLUTION | RESPIRATORY_TRACT | Status: DC | PRN

## 2014-06-18 MED ORDER — PREDNISONE 10 MG PO TABS: ORAL_TABLET | ORAL | Status: DC

## 2014-06-18 NOTE — Progress Notes (Signed)
Subjective: Was dyspneic and tripoding after exposure to perfume, but conversational Kept BIPAP on all night  Objective: Vital signs in last 24 hours: Blood pressure 117/73, pulse 108, temperature 97.8 F (36.6 C), temperature source Oral, resp. rate 20, height 5\' 7"  (1.702 m), weight 57.6 kg (126 lb 15.8 oz), SpO2 97.00%. 3 liters  Intake/Output from previous day: 08/21 0701 - 08/22 0700 In: 462 [P.O.:462] Out: 1700 [Urine:1700]  Physical Exam:  Female in NAD off BiPAP, speaking full sentences  Neck without LN or TMG Chest with decreased bs, no wheezing or crackles. Tripod. Tachy, regular. No MRG noted abd soft, nt/nd, bs + LE without edema, no cyanosis Moves all 4 extrem, responds to questions appropriately   Lab Results:  Recent Labs  06/16/14 0315  WBC 9.5  HGB 9.8*  HCT 33.2*  PLT 268   BMET  Recent Labs  06/16/14 0315  NA 141  K 4.9  CL 93*  CO2 >45*  GLUCOSE 106*  BUN 12  CREATININE 0.49*  CALCIUM 9.5   Studies/Results: No results found.  Assessment/Plan:  Acute on chronic hypercarbic respiratory failure secondary to known severe copd.  Suspect she has never fully recovered from prior hospitalization. Feeling better.   -cont BiPAP @ HS. She already has BIPAP machine at home. Have asked her to brink in mask  - Titrate O2 down for sat of 88-90% given severe COPD. -resume spiriva, start brovana/budesonide (in place of symbicort)--> have asked CM to check on out-of-pocket cost. If the nebs are more expensive then we will just go back to symbicort.  - change solumedrol to pred - pulm hygiene   - Her plan is to return home with her husband even if she must do so on BiPAP. Has BIPAP 14/ 6 [facility] at home. Ready for dc home, using O2 2-3L/M. Needs [facility] apt post hosp 1-2 weeks. COPD GOLD status.    Waymon Budgelinton D Remus Hagedorn, MD [phone]

## 2014-06-21 ENCOUNTER — Telehealth: Payer: Self-pay | Admitting: Adult Health

## 2014-06-21 DIAGNOSIS — J449 Chronic obstructive pulmonary disease, unspecified: Secondary | ICD-10-CM

## 2014-06-21 NOTE — Care Management Note (Addendum)
06-21-14 Patient called charge nurse on 6N requesting HHRN, HHPT through [facility] .  HHRN and HHPT not ordered prior to discharge . Mariann Barter, NP office . Awaiting call back .   Patient 's phone number is [phone]  Patient aware .  Ronny Flurry RN BSN [phone]

## 2014-06-21 NOTE — Telephone Encounter (Signed)
Called and spoke with Herbert Seta from Care management and she stated that the pt was d/c on Saturday from the hospital and the pt was told that she would have an order sent in for DME---home health and nurse visits.  Looks like Katie and CY did the d/c but these orders were not put in.  Pt has a HFU appt with TP on 8/31.  TP please advise if you are ok to order these for the pt.  Thanks  Allergies  Allergen Reactions  . Bactrim Ds [Sulfamethoxazole-Trimethoprim] Hives  . Codeine Nausea And Vomiting  . Morphine Nausea And Vomiting  . Demerol [Meperidine] Other (See Comments)    Current Outpatient Prescriptions on File Prior to Visit  Medication Sig Dispense Refill  . albuterol (PROVENTIL) (2.5 MG/3ML) 0.083% nebulizer solution Take 3 mLs (2.5 mg total) by nebulization every 4 (four) hours as needed for shortness of breath.  75 mL  12  . alendronate (FOSAMAX) 70 MG tablet Take 70 mg by mouth every Wednesday. Take with a full glass of water on an empty stomach.      Marland Kitchen arformoterol (BROVANA) 15 MCG/2ML NEBU Take 2 mLs (15 mcg total) by nebulization 2 (two) times daily.  120 mL  1  . budesonide (PULMICORT) 0.5 MG/2ML nebulizer solution Take 2 mLs (0.5 mg total) by nebulization 2 (two) times daily.  120 mL  1  . feeding supplement, ENSURE COMPLETE, (ENSURE COMPLETE) LIQD Take 237 mLs by mouth 2 (two) times daily between meals.      Marland Kitchen LORazepam (ATIVAN) 1 MG tablet Take 0.5 tablets (0.5 mg total) by mouth 2 (two) times daily as needed for anxiety.  15 tablet  0  . metoprolol tartrate (LOPRESSOR) 25 MG tablet Take 12.5 mg by mouth 2 (two) times daily.       . pantoprazole (PROTONIX) 40 MG tablet Take 40 mg by mouth daily.      . predniSONE (DELTASONE) 10 MG tablet Take 4 tabs PO daily x 3 days, then 3 tabs PO daily x 3 days, then 2 tabs PO daily x 3 days, then 1 tab daily until seen in pulmonary office  30 tablet  0  . simvastatin (ZOCOR) 20 MG tablet Take 20 mg by mouth daily at 6 PM.      . tiotropium  (SPIRIVA) 18 MCG inhalation capsule Place 1 capsule (18 mcg total) into inhaler and inhale daily.  30 capsule  2  . valACYclovir (VALTREX) 500 MG tablet Take 500 mg by mouth every morning.        No current facility-administered medications on file prior to visit.

## 2014-06-22 NOTE — Telephone Encounter (Signed)
Heather w/ Care Management calling again about request for Home Health RN and PT. TP has never seen pt, but discharge was done by [facility] Per Herbert Seta, she also thinks an order for Social Work would be appropriate Call back for Herbert Seta = [phone] Dr Maple Hudson, would you mind okaying an order to [facility] for Home Health RN, Physical Therapy and Social Work?  Thank you.  Per the 8.22.15 discharge: Acute on chronic hypercarbic respiratory failure secondary to known severe copd  D/c plan --  qhs bipap ipap 14, epap 8 -- has this set up at home  Continuous O2 as prior  Continue spiriva, bronana, budesonide  Prednisone with taper back to ?baseline  -- need to determine if she needs chronic pred  Refuses SNF - will return home with husband and cont to consider SNF as outpt  Consider more symptom management/comfort based approach - 3 admits since July in pt with ESCOPD, ??consider hospice

## 2014-06-22 NOTE — Care Management (Signed)
06-22-14 Received home health orders , Referral given to [facility] ( patient request ) . Phone patient and made her aware . Ronny Flurry RN BSN

## 2014-06-22 NOTE — Telephone Encounter (Signed)
Ok DME [facility]- Home Health RN, Home PT, Social Worker eval and treat, severe COPD with emphysema

## 2014-06-22 NOTE — Care Management (Signed)
06-22-14 Called [facility] regarding HHRN, PT, SW . Shanda Bumps will ask MD who discharge patient and call NCM back. Ronny Flurry RN BSN

## 2014-06-22 NOTE — Telephone Encounter (Signed)
Orders placed, heather is aware. Carron Curie, CMA

## 2014-06-24 ENCOUNTER — Inpatient Hospital Stay: Payer: BC Managed Care – PPO | Admitting: Emergency Medicine

## 2014-06-27 ENCOUNTER — Encounter: Payer: Self-pay | Admitting: Adult Health

## 2014-06-27 ENCOUNTER — Ambulatory Visit (INDEPENDENT_AMBULATORY_CARE_PROVIDER_SITE_OTHER): Payer: BC Managed Care – PPO | Admitting: Adult Health

## 2014-06-27 ENCOUNTER — Other Ambulatory Visit: Payer: BC Managed Care – PPO

## 2014-06-27 VITALS — HR 107 | Temp 98.3°F | Ht 67.5 in | Wt 126.8 lb

## 2014-06-27 DIAGNOSIS — Z23 Encounter for immunization: Secondary | ICD-10-CM

## 2014-06-27 DIAGNOSIS — J449 Chronic obstructive pulmonary disease, unspecified: Secondary | ICD-10-CM

## 2014-06-27 MED ORDER — PREDNISONE 10 MG PO TABS: 10.0000 mg | ORAL_TABLET | Freq: Every day | ORAL | Status: DC

## 2014-06-27 MED ORDER — PANTOPRAZOLE SODIUM 40 MG PO TBEC: 40.0000 mg | DELAYED_RELEASE_TABLET | Freq: Two times a day (BID) | ORAL | Status: DC

## 2014-06-27 NOTE — Progress Notes (Signed)
Subjective:    Patient ID: Elizabeth [name], female    DOB: [birth year], 58 y.o.   MRN: [id number]  HPI 58 yo former smoker , quit 2013 , presumed very severe COPD w/ chronic hypercarbic/hypoxic RF on chronic O2.  Seen for initial pulmonary consult during hospital admit 05/28/14  Followed by PCP  Admit 05/22/14 AECOPD  Admit 05/28/14 AECOPD/VDRF  Admit 06/11/14 AECOPD /A on C RF    06/27/2014 Post Hospital follow up  Pt returns for a post hospital follow up .  Pt with several hospital admits over last 1 month for severe COPD exacerbation c/by acute hypoxic /hypercarbic RF w/ 1 admission w/ vent support. Last admit was started on nocturnal BIPAP . PCO2 ~90. Changed from symbicort to Brovana and Budesonide Neb , continued on Spiriva. Tx w/ steroid taper  currently tapering down on  daily .   She is a former smoker, Smoked 71 y 1PPD .  FH of asthma, mother died of asthma attack  Been on oxygen 3 yrs at  3l/m  Disability since 2013 for lung disease .  Unclear if she has had PFT .  On symbicort and spiriva PTA, changed to  Brovana/Pulmicort nebs at discharge.  Remains on Spiriva daily  Started BIPAP At bedtime  - wearing 6-8hrs  Was on prophy doxycyline for 1 year by PCP for COPD .  No longer taking this.  BC and Sputum cx last admit was neg.  CXR showed advanced emphysema w/ lower lobe bullous change.  Since discharge she is feeling some better but still weak.  No flare in cough or wheezing  Denies chest pain, orthopnea, edema , n/v/d or hemoptysis.     Review of Systems Constitutional:   No  weight loss, night sweats,  Fevers, chills,  +fatigue, or  lassitude.  HEENT:   No headaches,  Difficulty swallowing,  Tooth/dental problems, or  Sore throat,                No sneezing, itching, ear ache,  +nasal congestion, post nasal drip,   CV:  No chest pain,  Orthopnea, PND, swelling in lower extremities, anasarca, dizziness, palpitations, syncope.   GI  No heartburn, indigestion,  abdominal pain, nausea, vomiting, diarrhea, change in bowel habits, loss of appetite, bloody stools.   Resp:  No chest wall deformity  Skin: no rash or lesions.  GU: no dysuria, change in color of urine, no urgency or frequency.  No flank pain, no hematuria   MS:  No joint pain or swelling.  No decreased range of motion.  No back pain.  Psych:  No change in mood or affect. No depression or anxiety.  No memory loss.          Objective:   Physical Exam GEN: A/Ox3; pleasant , NAD, chronically ill appearing in wheelchair   HEENT:  [place]/AT,  EACs-clear, TMs-wnl, NOSE-clear, THROAT-clear, no lesions, no postnasal drip or exudate noted.   NECK:  Supple w/ fair ROM; no JVD; normal carotid impulses w/o bruits; no thyromegaly or nodules palpated; no lymphadenopathy.  RESP  Diminished BS in bases no accessory muscle use, no dullness to percussion  CARD:  RRR, no m/r/g  , no  peripheral edema, pulses intact, no cyanosis or clubbing.  GI:   Soft & nt; nml bowel sounds; no organomegaly or masses detected.  Musco: Warm bil, no deformities or joint swelling noted.   Neuro: alert, no focal deficits noted.    Skin: Warm, no lesions  or rashes         Assessment & Plan:

## 2014-06-27 NOTE — Assessment & Plan Note (Signed)
Suspected severe COPD -on chronic O2  Needs PFT  Frequent exacerbations -will hold on low dose steroids as stabilizes hope to taper to off, steroid discussion with pt/family  Check alpha 1  Flu shot today .  PVX utd   Plan  Continue on Brovana and Pulmicort Neb Twice daily   Continue on Spiriva daily  Continue on BIPAP At bedtime  And with naps.  Alpha 1 lab test today .  Taper prednisone down to prednisone  daily and hold at this dose.  Follow up Dr. Marchelle Gearing or Dr. Maple Hudson  In 3-4  weeks with PFT  Please contact office for sooner follow up if symptoms do not improve or worsen or seek emergency care

## 2014-06-27 NOTE — Patient Instructions (Signed)
Continue on Brovana and Pulmicort Neb Twice daily   Continue on Spiriva daily  Continue on BIPAP At bedtime  And with naps.  Alpha 1 lab test today .  Taper prednisone down to prednisone  daily and hold at this dose.  Follow up Dr. Marchelle Gearing or Dr. Maple Hudson  In 3-4  weeks with PFT  Please contact office for sooner follow up if symptoms do not improve or worsen or seek emergency care

## 2014-06-28 DIAGNOSIS — Z23 Encounter for immunization: Secondary | ICD-10-CM

## 2014-06-28 DIAGNOSIS — Z9911 Dependence on respirator [ventilator] status: Secondary | ICD-10-CM

## 2014-06-28 DIAGNOSIS — J989 Respiratory disorder, unspecified: Secondary | ICD-10-CM

## 2014-06-28 HISTORY — DX: Dependence on respirator (ventilator) status: Z99.11

## 2014-06-28 HISTORY — DX: Respiratory disorder, unspecified: J98.9

## 2014-07-02 ENCOUNTER — Inpatient Hospital Stay (HOSPITAL_COMMUNITY)
Admission: EM | Admit: 2014-07-02 | Discharge: 2014-07-05 | DRG: 190 | Disposition: A | Discharge status: Home / Self Care | Payer: BC Managed Care – PPO | Attending: Internal Medicine | Admitting: Internal Medicine

## 2014-07-02 ENCOUNTER — Encounter (HOSPITAL_COMMUNITY): Payer: Self-pay | Admitting: Emergency Medicine

## 2014-07-02 ENCOUNTER — Emergency Department (HOSPITAL_COMMUNITY): Payer: BC Managed Care – PPO

## 2014-07-02 DIAGNOSIS — E872 Acidosis, unspecified: Secondary | ICD-10-CM | POA: Diagnosis present

## 2014-07-02 DIAGNOSIS — R5383 Other fatigue: Secondary | ICD-10-CM | POA: Diagnosis present

## 2014-07-02 DIAGNOSIS — R5381 Other malaise: Secondary | ICD-10-CM | POA: Diagnosis present

## 2014-07-02 DIAGNOSIS — Z9981 Dependence on supplemental oxygen: Secondary | ICD-10-CM | POA: Diagnosis not present

## 2014-07-02 DIAGNOSIS — I10 Essential (primary) hypertension: Secondary | ICD-10-CM | POA: Diagnosis present

## 2014-07-02 DIAGNOSIS — J9620 Acute and chronic respiratory failure, unspecified whether with hypoxia or hypercapnia: Secondary | ICD-10-CM | POA: Diagnosis present

## 2014-07-02 DIAGNOSIS — J9622 Acute and chronic respiratory failure with hypercapnia: Secondary | ICD-10-CM | POA: Diagnosis present

## 2014-07-02 DIAGNOSIS — K219 Gastro-esophageal reflux disease without esophagitis: Secondary | ICD-10-CM | POA: Diagnosis present

## 2014-07-02 DIAGNOSIS — R259 Unspecified abnormal involuntary movements: Secondary | ICD-10-CM | POA: Diagnosis present

## 2014-07-02 DIAGNOSIS — Z87891 Personal history of nicotine dependence: Secondary | ICD-10-CM | POA: Diagnosis not present

## 2014-07-02 DIAGNOSIS — J449 Chronic obstructive pulmonary disease, unspecified: Secondary | ICD-10-CM

## 2014-07-02 DIAGNOSIS — I959 Hypotension, unspecified: Secondary | ICD-10-CM | POA: Diagnosis present

## 2014-07-02 DIAGNOSIS — J9600 Acute respiratory failure, unspecified whether with hypoxia or hypercapnia: Secondary | ICD-10-CM | POA: Diagnosis present

## 2014-07-02 DIAGNOSIS — E785 Hyperlipidemia, unspecified: Secondary | ICD-10-CM

## 2014-07-02 DIAGNOSIS — J441 Chronic obstructive pulmonary disease with (acute) exacerbation: Secondary | ICD-10-CM | POA: Diagnosis present

## 2014-07-02 DIAGNOSIS — R000 Tachycardia, unspecified: Secondary | ICD-10-CM | POA: Diagnosis present

## 2014-07-02 DIAGNOSIS — J9602 Acute respiratory failure with hypercapnia: Secondary | ICD-10-CM

## 2014-07-02 LAB — BASIC METABOLIC PANEL
BUN: 11 mg/dL (ref 6–23)
CALCIUM: 8.7 mg/dL (ref 8.4–10.5)
CO2: >45 mEq/L (ref 19–32)
Chloride: 88 mEq/L — ABNORMAL LOW (ref 96–112)
Creatinine, Ser: 0.47 mg/dL — ABNORMAL LOW (ref 0.50–1.10)
GFR calc Af Amer: >90 mL/min (ref >90)
GLUCOSE: 97 mg/dL (ref 70–99)
Potassium: 4.2 mEq/L (ref 3.7–5.3)
Sodium: 139 mEq/L (ref 137–147)

## 2014-07-02 LAB — CBC WITH DIFFERENTIAL/PLATELET
Basophils Absolute: 0 10*3/uL (ref 0.0–0.1)
Basophils Relative: 0 % (ref 0–1)
EOS ABS: 0.1 10*3/uL (ref 0.0–0.7)
EOS PCT: 1 % (ref 0–5)
HCT: 37.9 % (ref 36.0–46.0)
Hemoglobin: 10.6 g/dL — ABNORMAL LOW (ref 12.0–15.0)
Lymphocytes Relative: 36 % (ref 12–46)
Lymphs Abs: 3.6 10*3/uL (ref 0.7–4.0)
MCH: 29.9 pg (ref 26.0–34.0)
MCHC: 28 g/dL — AB (ref 30.0–36.0)
MCV: 107.1 fL — AB (ref 78.0–100.0)
Monocytes Absolute: 0.9 10*3/uL (ref 0.1–1.0)
Monocytes Relative: 9 % (ref 3–12)
NEUTROS PCT: 54 % (ref 43–77)
Neutro Abs: 5.4 10*3/uL (ref 1.7–7.7)
Platelets: 213 10*3/uL (ref 150–400)
RBC: 3.54 MIL/uL — AB (ref 3.87–5.11)
RDW: 14.6 % (ref 11.5–15.5)
WBC: 10 10*3/uL (ref 4.0–10.5)

## 2014-07-02 LAB — BLOOD GAS, ARTERIAL
ACID-BASE EXCESS: 13.6 mmol/L — AB (ref 0.0–2.0)
Acid-Base Excess: 22.9 mmol/L — ABNORMAL HIGH (ref 0.0–2.0)
Bicarbonate: 50.4 mEq/L — ABNORMAL HIGH (ref 20.0–24.0)
Bicarbonate: 40.3 mEq/L — ABNORMAL HIGH (ref 20.0–24.0)
DELIVERY SYSTEMS: POSITIVE
DRAWN BY: 31297
Drawn by: 41308
Expiratory PAP: 8
FIO2: 40 %
INSPIRATORY PAP: 18
LHR: 10 {breaths}/min
O2 Content: 3 L/min
O2 SAT: 95.8 %
O2 SAT: 99.7 %
PATIENT TEMPERATURE: 98.6
PCO2 ART: 107 mmHg — AB (ref 35.0–45.0)
PCO2 ART: 83.1 mmHg — AB (ref 35.0–45.0)
PO2 ART: 81.9 mmHg (ref 80.0–100.0)
Patient temperature: 98.6
TCO2: 53.7 mmol/L (ref 0–100)
TCO2: 42.8 mmol/L (ref 0–100)
pH, Arterial: 7.296 — ABNORMAL LOW (ref 7.350–7.450)
pH, Arterial: 7.306 — ABNORMAL LOW (ref 7.350–7.450)
pO2, Arterial: 148 mmHg — ABNORMAL HIGH (ref 80.0–100.0)

## 2014-07-02 LAB — I-STAT CG4 LACTIC ACID, ED: Lactic Acid, Venous: <0.3 mmol/L — ABNORMAL LOW (ref 0.5–2.2)

## 2014-07-02 LAB — I-STAT ARTERIAL BLOOD GAS, ED
Acid-Base Excess: 18 mmol/L — ABNORMAL HIGH (ref 0.0–2.0)
Bicarbonate: 47.8 mEq/L — ABNORMAL HIGH (ref 20.0–24.0)
O2 SAT: 85 %
PCO2 ART: 89.2 mmHg — AB (ref 35.0–45.0)
PH ART: 7.337 — AB (ref 7.350–7.450)
PO2 ART: 57 mmHg — AB (ref 80.0–100.0)
Patient temperature: 98.6
TCO2: >50 mmol/L (ref 0–100)

## 2014-07-02 LAB — TROPONIN I: Troponin I: <0.3 ng/mL (ref <0.30)

## 2014-07-02 LAB — PRO B NATRIURETIC PEPTIDE: PRO B NATRI PEPTIDE: 27 pg/mL (ref 0–125)

## 2014-07-02 MED ORDER — TIOTROPIUM BROMIDE MONOHYDRATE 18 MCG IN CAPS
18.0000 ug | ORAL_CAPSULE | Freq: Every day | RESPIRATORY_TRACT | Status: DC
  Administered 2014-07-02: 18 ug via RESPIRATORY_TRACT
  Filled 2014-07-02: qty 5

## 2014-07-02 MED ORDER — SODIUM CHLORIDE 0.9 % IV SOLN
INTRAVENOUS | Status: AC
  Administered 2014-07-02: 18:00:00 via INTRAVENOUS

## 2014-07-02 MED ORDER — SODIUM CHLORIDE 0.9 % IV SOLN
INTRAVENOUS | Status: DC
  Administered 2014-07-02: 12:00:00 via INTRAVENOUS
  Administered 2014-07-03: 1000 mL via INTRAVENOUS
  Administered 2014-07-03 – 2014-07-04 (×2): via INTRAVENOUS
  Administered 2014-07-04: 1000 mL via INTRAVENOUS

## 2014-07-02 MED ORDER — ALBUTEROL (5 MG/ML) CONTINUOUS INHALATION SOLN
10.0000 mg/h | INHALATION_SOLUTION | Freq: Once | RESPIRATORY_TRACT | Status: AC
  Administered 2014-07-02: 10 mg/h via RESPIRATORY_TRACT
  Filled 2014-07-02: qty 20

## 2014-07-02 MED ORDER — METHYLPREDNISOLONE SODIUM SUCC 125 MG IJ SOLR
60.0000 mg | Freq: Every day | INTRAMUSCULAR | Status: DC
  Filled 2014-07-02: qty 0.96

## 2014-07-02 MED ORDER — HEPARIN SODIUM (PORCINE) 5000 UNIT/ML IJ SOLN
5000.0000 [IU] | Freq: Three times a day (TID) | INTRAMUSCULAR | Status: DC
  Administered 2014-07-02 – 2014-07-05 (×9): 5000 [IU] via SUBCUTANEOUS
  Filled 2014-07-02 (×10): qty 1

## 2014-07-02 MED ORDER — LEVOFLOXACIN IN D5W 750 MG/150ML IV SOLN
750.0000 mg | INTRAVENOUS | Status: DC
  Administered 2014-07-02 – 2014-07-05 (×4): 750 mg via INTRAVENOUS
  Filled 2014-07-02 (×4): qty 150

## 2014-07-02 MED ORDER — IPRATROPIUM-ALBUTEROL 0.5-2.5 (3) MG/3ML IN SOLN
3.0000 mL | RESPIRATORY_TRACT | Status: DC
  Administered 2014-07-02: 3 mL via RESPIRATORY_TRACT
  Filled 2014-07-02: qty 3

## 2014-07-02 MED ORDER — SODIUM CHLORIDE 0.9 % IV BOLUS (SEPSIS)
1000.0000 mL | Freq: Once | INTRAVENOUS | Status: AC
  Administered 2014-07-02: 1000 mL via INTRAVENOUS

## 2014-07-02 MED ORDER — ARFORMOTEROL TARTRATE 15 MCG/2ML IN NEBU
15.0000 ug | INHALATION_SOLUTION | Freq: Two times a day (BID) | RESPIRATORY_TRACT | Status: DC
  Administered 2014-07-02: 15 ug via RESPIRATORY_TRACT
  Filled 2014-07-02 (×2): qty 2

## 2014-07-02 MED ORDER — IPRATROPIUM BROMIDE 0.02 % IN SOLN
0.5000 mg | Freq: Once | RESPIRATORY_TRACT | Status: AC
Start: 2014-07-02 — End: 2014-07-02
  Administered 2014-07-02: 0.5 mg via RESPIRATORY_TRACT
  Filled 2014-07-02: qty 2.5

## 2014-07-02 MED ORDER — IPRATROPIUM-ALBUTEROL 0.5-2.5 (3) MG/3ML IN SOLN
3.0000 mL | Freq: Three times a day (TID) | RESPIRATORY_TRACT | Status: DC
Start: 2014-07-03 — End: 2014-07-04
  Administered 2014-07-03 – 2014-07-04 (×4): 3 mL via RESPIRATORY_TRACT
  Filled 2014-07-02 (×4): qty 3

## 2014-07-02 MED ORDER — BUDESONIDE 0.5 MG/2ML IN SUSP
0.5000 mg | Freq: Two times a day (BID) | RESPIRATORY_TRACT | Status: DC
  Administered 2014-07-02: 0.5 mg via RESPIRATORY_TRACT
  Filled 2014-07-02 (×2): qty 2

## 2014-07-02 MED ORDER — METHYLPREDNISOLONE SODIUM SUCC 125 MG IJ SOLR
125.0000 mg | Freq: Once | INTRAMUSCULAR | Status: AC
Start: 2014-07-02 — End: 2014-07-02
  Administered 2014-07-02: 125 mg via INTRAVENOUS
  Filled 2014-07-02: qty 2

## 2014-07-02 NOTE — ED Notes (Signed)
Attempted report 

## 2014-07-02 NOTE — H&P (Signed)
Date: 07/02/2014               Patient Name:  Elizabeth [name] MRN: [id number]  DOB: 1956-06-14 Age / Sex: 58 y.o., female   PCP: Toma Deiters, MD         Medical Service: Internal Medicine Teaching Service         Attending Physician: Dr. Aletta Edouard, MD    First Contact: Dr. Rich Number Pager: [phone]  Second Contact: Dr. Charlsie Merles Pager: [phone]       After Hours (After 5p/  First Contact Pager: [phone]  weekends / holidays): Second Contact Pager: [phone]   Chief Complaint: Respiratory Distress  History of Present Illness: Elizabeth [name] is a 58yo woman w/ PMHx of end-stage COPD and chronic hypercarbic/hypoxic respiratory failure, HTN, GERD, and HLD who presented to the ED with 2-3 day hx of worsening dyspnea. History was obtained through medical records and patient's husband because she was on BiPAP. Patient was recently hospitalized from 8/16-8/20 for acute on chronic respiratory failure due to COPD. She was discharged with instructions to do BiPAP at night and continue with spiriva, bronana, budesonide, and a prednisone taper. Prior to this hospitalization she had been admitted on 8/1-8/7 for respiratory distress requiring intubation. Patient is currently on 3 L oxygen at home. Patient has been complaint with her COPD medications.   Patient's husband reports she had home PT a few days ago and "overdid it." He states she had worsening shortness of breath, wheezing, and weakness over the last few days since her home PT. He notes she slept all day yesterday and has had decreased appetite. He notes she had chest pain this morning which lasted about 15 minutes. She then started to have shakiness and tremors so the husband brought her into the ED. Patient is a previous 1 pack per day smoker and quit 10 years ago. Patient's husband also notes that she had a fall this morning in the bathroom. Pt fell onto her left knee. Husband states she did not hit her head or lose consciousness. X-ray  of left knee showed no fracture or abnormalities.   In the ED, patient was hypotensive with BP in 80s/60s and tachycardic at 115. She was placed on BiPAP with FiO2 40 and was saturating at 93-96%. ABG showed pH 7.29, pCO2 107, pO2 82, bicarb 50, O2 sat 96%.   Meds: Current Facility-Administered Medications  Medication Dose Route Frequency Provider Last Rate Last Dose  . 0.9 %  sodium chloride infusion   Intravenous Continuous Derwood Kaplan, MD 125 mL/hr at 07/02/14 1211    . arformoterol (BROVANA) nebulizer solution 15 mcg  15 mcg Nebulization BID Derwood Kaplan, MD   15 mcg at 07/02/14 1059  . budesonide (PULMICORT) nebulizer solution 0.5 mg  0.5 mg Nebulization BID Ankit Nanavati, MD   0.5 mg at 07/02/14 1059  . tiotropium (SPIRIVA) inhalation capsule 18 mcg  18 mcg Inhalation Daily Derwood Kaplan, MD   18 mcg at 07/02/14 1058    Allergies: Allergies as of 07/02/2014 - Review Complete 07/02/2014  Allergen Reaction Noted  . Bactrim ds [sulfamethoxazole-trimethoprim] Hives 05/30/2014  . Codeine Nausea And Vomiting   . Morphine Nausea And Vomiting   . Demerol [meperidine] Other (See Comments) 05/30/2014   Past Medical History  Diagnosis Date  . COPD (chronic obstructive pulmonary disease)   . Hypertension   . Acute respiratory failure   . GERD (gastroesophageal reflux disease)   . Hiatal hernia   .  Hyperlipidemia   . Shortness of breath    Past Surgical History  Procedure Laterality Date  . Cholecystectomy     History reviewed. No pertinent family history. History   Social History  . Marital Status: Married    Spouse Name: N/A    Number of Children: N/A  . Years of Education: N/A   Occupational History  . Not on file.   Social History Main Topics  . Smoking status: Former Smoker -- 1.00 packs/day for 30 years    Types: Cigarettes    Quit date: 05/30/2004  . Smokeless tobacco: Never Used  . Alcohol Use: No  . Drug Use: No  . Sexual Activity: Not on file   Other  Topics Concern  . Not on file   Social History Narrative  . No narrative on file    Review of Systems: General: Denies night sweats, weight changes HEENT: Denies headaches, ear pain, rhinorrhea, sore throat CV: See HPI Pulm: See HPI GI: Denies abdominal pain, diarrhea, constipation GU: Denies dysuria, hematuria Msk: Denies muscle cramps, joint pains Neuro: Denies tingling, numbness  Physical Exam: Blood pressure 85/59, pulse 110, temperature 98.3 F (36.8 C), temperature source Oral, resp. rate 15, SpO2 100.00%. General: laying in stretcher, in respiratory distress on BiPAP machine HEENT: [place]/AT, EOMI, mucus membranes dry Neck: supple, no JVD CV: tachycardic, normal S1/S2, no m/g/r Pulm: diffuse wheezes heard anteriorly, unable to assess breath sounds from posterior, breaths labored on BiPAP Abd: BS+, soft, non-tender Msk: warm, no edema Neuro: alert and oriented x 3, somnolent  Lab results: Basic Metabolic Panel:  Recent Labs  96/04/54 1048  NA 139  K 4.2  CL 88*  CO2 >45*  GLUCOSE 97  BUN 11  CREATININE 0.47*  CALCIUM 8.7   Liver Function Tests: No results found for this basename: AST, ALT, ALKPHOS, BILITOT, PROT, ALBUMIN,  in the last 72 hours No results found for this basename: LIPASE, AMYLASE,  in the last 72 hours No results found for this basename: AMMONIA,  in the last 72 hours CBC:  Recent Labs  07/02/14 1048  WBC 10.0  NEUTROABS 5.4  HGB 10.6*  HCT 37.9  MCV 107.1*  PLT 213   Cardiac Enzymes:  Recent Labs  07/02/14 1048  TROPONINI <0.30   BNP:  Recent Labs  07/02/14 1048  PROBNP 27.0   D-Dimer: No results found for this basename: DDIMER,  in the last 72 hours CBG: No results found for this basename: GLUCAP,  in the last 72 hours Hemoglobin A1C: No results found for this basename: HGBA1C,  in the last 72 hours Fasting Lipid Panel: No results found for this basename: CHOL, HDL, LDLCALC, TRIG, CHOLHDL, LDLDIRECT,  in the last 72  hours Thyroid Function Tests: No results found for this basename: TSH, T4TOTAL, FREET4, T3FREE, THYROIDAB,  in the last 72 hours Anemia Panel: No results found for this basename: VITAMINB12, FOLATE, FERRITIN, TIBC, IRON, RETICCTPCT,  in the last 72 hours Coagulation: No results found for this basename: LABPROT, INR,  in the last 72 hours Urine Drug Screen: Drugs of Abuse  No results found for this basename: labopia, cocainscrnur, labbenz, amphetmu, thcu, labbarb    Alcohol Level: No results found for this basename: ETH,  in the last 72 hours Urinalysis: No results found for this basename: COLORURINE, APPERANCEUR, LABSPEC, PHURINE, GLUCOSEU, HGBUR, BILIRUBINUR, KETONESUR, PROTEINUR, UROBILINOGEN, NITRITE, LEUKOCYTESUR,  in the last 72 hours   Imaging results:  Dg Chest Port 1 View  07/02/2014  CLINICAL DATA:  Labored breathing, COPD  EXAM: PORTABLE CHEST - 1 VIEW  COMPARISON:  06/16/2014  FINDINGS: Chronic interstitial markings/emphysematous changes. No focal consolidation. No pleural effusion or pneumothorax.  The heart is normal in size.  IMPRESSION: No evidence of acute cardiopulmonary disease.   Electronically Signed   By: Charline Bills M.D.   On: 07/02/2014 11:13   Dg Knee Left Port  07/02/2014   CLINICAL DATA:  Fall, left knee pain  EXAM: PORTABLE LEFT KNEE - 1-2 VIEW  COMPARISON:  None.  FINDINGS: There is no evidence of fracture, dislocation, or joint effusion. There is no evidence of arthropathy or other focal bone abnormality. Soft tissues are unremarkable. Lateral view is suboptimal due to flexion of the knee.  IMPRESSION: Negative.   Electronically Signed   By: Christiana Pellant M.D.   On: 07/02/2014 12:51    Other results: EKG: sinus tachycardia, nonspecific T wave abnormalities  Assessment & Plan: Mrs. Fritze is a 58yo woman w/ PMHx of end-stage COPD and chronic hypercarbic/hypoxic respiratory failure, HTN, GERD, and HLD who presented to the ED with 2-3 day hx of worsening  dyspnea.  1. Acute on Chronic Hypercarbic/Hypoxic Respiratory Failure: Patient with hx of end-stage COPD and multiple hospitalizations in last month due to respiratory distress/COPD exacerbations. Hospitalization on 8/1 required intubation. Patient is likely deconditioned and the exertion from home PT pushed her into respiratory distress. She had diffuse wheezing on exam and had labored breathing on BiPAP. ABG is consistent with chronic respiratory acidosis with metabolic compensation. Patient has no history of cardiac problems and BNP was 27, making a cardiopulmonary cause less likely for her respiratory distress. Troponin negative x 1. CXR showed chronic emphysematous changes.  - Start Levofloxacin 750 mg IV - Continue Duonebs Q4H - Start Solu-medrol 60 mg IV daily - Bed rest - BiPAP - Pulse oximetry - bmet tomorrow AM  2.  HTN: Pt presented with hypotension in 70-80s/50-60s. Pt takes Metoprolol 12.5 mg BID at home. - Will hold Metoprolol for now given low BPs - Continue to monitor vitals  3. GERD: Patient takes Protonix 40 mg BID at home. - Will hold Protonix for now - Resume once patient able to eat again - NPO  4. HLD: Pt takes Simvastatin 20 mg daily at home.  - Will hold for now given respiratory distress - Resume once patient's respiratory status improves and able to tolerate PO intake  Diet: NPO DVT/PE PPx: Heparin SQ Dispo: Disposition is deferred at this time, awaiting improvement of current medical problems. Anticipated discharge in approximately 2-3 day(s).   The patient does have a current PCP (Toma Deiters, MD) and does need an [facility] hospital follow-up appointment after discharge.  The patient does not have transportation limitations that hinder transportation to clinic appointments.  Signed: Rich Number, MD 07/02/2014, 4:59 PM

## 2014-07-02 NOTE — ED Notes (Signed)
Brought pt back to room via wheelchair with family in tow; pt getting undressed and into a gown at this time; family at bedside; Marylu Lund, [place] and Marylene Land, RN aware of pt

## 2014-07-02 NOTE — Progress Notes (Signed)
Spoke with Dr.Patel about waiting to draw ABG in the AM since two attempts have been made at this time. He is agreeable, therefore will draw another ABG in the morning.

## 2014-07-02 NOTE — ED Notes (Signed)
Pt reports generalized fatigue and weakness, has copd but recent increase in sob. Having twitching to body and difficulty sleeping. spo2 90% on 3L [place].

## 2014-07-02 NOTE — Progress Notes (Signed)
Called about pts ABG with Po2- 148, PCo2- 83.1, PH- 7.3. Marked increase in Po2- from 57 6 hrs prior, no significant change in CO2. Pt on BIPAP. Pt likely a chronic CO2 retainer. Pt with ESCOPD. Intubated during admission last month. Went to see pt at about- 7.45pm.   Pt doing okay- Alert and oriented to TPP. Significant other at bedside. Pt and family do not want pt to be intubated. BIPAP settings on Above ABG- Fi02- 40%, Rate- 10.  PLan- Decrease Fi02 to 30% - Increase RR to 14.  - ABG in 1 hour.  Inocente Salles PGY-2 IMTS.

## 2014-07-02 NOTE — ED Notes (Signed)
Abnormal labs given to Dr. Rhunette Croft and RN Marylene Land

## 2014-07-02 NOTE — Significant Event (Signed)
CRITICAL VALUE ALERT  Critical value received:  CO2 83 (ABG: pH 7.30, CO2 83, PO2 148, Bicarb 40.3)  Date of notification:  06/01/14  Time of notification:  1956  Critical value read back: yes  Nurse who received alert:  Jeanmarie Plant RN  MD notified (1st page): IMTS pager   Time of first page:  2000  Responding MD:  Dr. Allena Katz  Time MD responded:  2002

## 2014-07-02 NOTE — ED Provider Notes (Signed)
CSN: [id number]     Arrival date & time 07/02/14  0941 History   First MD Initiated Contact with Patient 07/02/14 [phone]     Chief Complaint  Patient presents with  . Shortness of Breath  . Fall     (Consider location/radiation/quality/duration/timing/severity/associated sxs/prior Treatment) HPI Comments: Pt with advanced COPD on home O2 comes in with cc of weakness, and shaking. Pt is on home O2 3 L all the time. She reports that over the past few days, she has increased dyspnea, with mild wheezing. She has not used rescue inhaler. She is feeling more and more tired, and slept all day yday, and started having some tremors, so she decided to come to the ER. No new cough. Not a smoker. No chest pains currently and no CAD hx, or PE, DVT hx.  Patient is a 58 y.o. female presenting with shortness of breath and fall. The history is provided by the patient and the spouse.  Shortness of Breath Associated symptoms: cough and wheezing   Associated symptoms: no abdominal pain, no chest pain, no neck pain, no rash and no vomiting   Fall Associated symptoms include shortness of breath. Pertinent negatives include no chest pain and no abdominal pain.    Past Medical History  Diagnosis Date  . COPD (chronic obstructive pulmonary disease)   . Hypertension   . Acute respiratory failure   . GERD (gastroesophageal reflux disease)   . Hiatal hernia   . Hyperlipidemia   . Shortness of breath    Past Surgical History  Procedure Laterality Date  . Cholecystectomy     History reviewed. No pertinent family history. History  Substance Use Topics  . Smoking status: Former Smoker -- 1.00 packs/day for 30 years    Types: Cigarettes    Quit date: 05/30/2004  . Smokeless tobacco: Never Used  . Alcohol Use: No   OB History   Grav Para Term Preterm Abortions TAB SAB Ect Mult Living                 Review of Systems  Constitutional: Negative for activity change.  HENT: Negative for facial swelling.    Respiratory: Positive for cough, shortness of breath and wheezing.   Cardiovascular: Positive for palpitations. Negative for chest pain and leg swelling.  Gastrointestinal: Negative for nausea, vomiting, abdominal pain, diarrhea, constipation, blood in stool and abdominal distention.  Genitourinary: Negative for hematuria and difficulty urinating.  Musculoskeletal: Negative for neck pain.  Skin: Negative for color change and rash.  Neurological: Negative for speech difficulty.  Hematological: Does not bruise/bleed easily.  Psychiatric/Behavioral: Positive for confusion.      Allergies  Bactrim ds; Codeine; Morphine; and Demerol  Home Medications   Prior to Admission medications   Medication Sig Start Date End Date Taking? Authorizing Provider  albuterol (PROAIR HFA) 108 (90 BASE) MCG/ACT inhaler Inhale 2 puffs into the lungs every 4 (four) hours as needed for wheezing or shortness of breath.   Yes Historical Provider, MD  albuterol (PROVENTIL) (2.5 MG/3ML) 0.083% nebulizer solution Take 3 mLs (2.5 mg total) by nebulization every 4 (four) hours as needed for shortness of breath. 06/18/14  Yes Bernadene Person, NP  alendronate (FOSAMAX) 70 MG tablet Take 70 mg by mouth every Wednesday. Take with a full glass of water on an empty stomach.   Yes Historical Provider, MD  arformoterol (BROVANA) 15 MCG/2ML NEBU Take 2 mLs (15 mcg total) by nebulization 2 (two) times daily. 06/18/14  Yes Mellody Life  Whiteheart, NP  aspirin 81 MG tablet Take 81 mg by mouth daily.   Yes Historical Provider, MD  budesonide (PULMICORT) 0.5 MG/2ML nebulizer solution Take 2 mLs (0.5 mg total) by nebulization 2 (two) times daily. 06/18/14  Yes Bernadene Person, NP  feeding supplement, ENSURE COMPLETE, (ENSURE COMPLETE) LIQD Take 237 mLs by mouth 2 (two) times daily between meals. 06/03/14  Yes Kathlen Mody, MD  LORazepam (ATIVAN) 1 MG tablet Take 0.5 tablets (0.5 mg total) by mouth 2 (two) times daily as needed for  anxiety. 06/16/14  Yes Bernadene Person, NP  metoprolol tartrate (LOPRESSOR) 25 MG tablet Take 12.5 mg by mouth 2 (two) times daily.    Yes Historical Provider, MD  pantoprazole (PROTONIX) 40 MG tablet Take 1 tablet (40 mg total) by mouth 2 (two) times daily before a meal. 06/27/14  Yes Tammy S Parrett, NP  predniSONE (DELTASONE) 10 MG tablet Take 1 tablet (10 mg total) by mouth daily with breakfast. 06/27/14  Yes Tammy S Parrett, NP  simvastatin (ZOCOR) 20 MG tablet Take 20 mg by mouth daily at 6 PM.   Yes Historical Provider, MD  tiotropium (SPIRIVA) 18 MCG inhalation capsule Place 18 mcg into inhaler and inhale daily.   Yes Historical Provider, MD  valACYclovir (VALTREX) 500 MG tablet Take 500 mg by mouth every morning.    Yes Historical Provider, MD   BP 101/68  Pulse 115  Temp(Src) 98.2 F (36.8 C) (Oral)  Resp 27  SpO2 99% Physical Exam  Nursing note and vitals reviewed. Constitutional: She is oriented to person, place, and time. She appears well-developed and well-nourished.  HENT:  Head: Normocephalic and atraumatic.  Eyes: EOM are normal. Pupils are equal, round, and reactive to light.  Neck: Neck supple.  Cardiovascular: Normal rate, regular rhythm and normal heart sounds.   No murmur heard. Pulmonary/Chest: Effort normal. No respiratory distress. She has wheezes.  Chest sounds tight, poor air entry.  Abdominal: Soft. She exhibits no distension. There is no tenderness. There is no rebound and no guarding.  Musculoskeletal: She exhibits no edema and no tenderness.  Neurological: She is alert and oriented to person, place, and time.  somnolent  Skin: Skin is warm and dry.    ED Course  Procedures (including critical care time) Labs Review Labs Reviewed  CBC WITH DIFFERENTIAL - Abnormal; Notable for the following:    RBC 3.54 (*)    Hemoglobin 10.6 (*)    MCV 107.1 (*)    MCHC 28.0 (*)    All other components within normal limits  BASIC METABOLIC PANEL - Abnormal;  Notable for the following:    Chloride 88 (*)    CO2 >45 (*)    Creatinine, Ser 0.47 (*)    All other components within normal limits  BLOOD GAS, ARTERIAL - Abnormal; Notable for the following:    pH, Arterial 7.296 (*)    pCO2 arterial 107.0 (*)    Bicarbonate 50.4 (*)    Acid-Base Excess 22.9 (*)    All other components within normal limits  TROPONIN I  PRO B NATRIURETIC PEPTIDE    Imaging Review Dg Chest Port 1 View  07/02/2014   CLINICAL DATA:  Labored breathing, COPD  EXAM: PORTABLE CHEST - 1 VIEW  COMPARISON:  06/16/2014  FINDINGS: Chronic interstitial markings/emphysematous changes. No focal consolidation. No pleural effusion or pneumothorax.  The heart is normal in size.  IMPRESSION: No evidence of acute cardiopulmonary disease.   Electronically Signed   By:  Charline Bills M.D.   On: 07/02/2014 11:13     EKG Interpretation None      MDM   Final diagnoses:  Acute respiratory failure with hypercapnia  COPD with exacerbation  Hypotension  CRITICAL CARE Performed by: Derwood Kaplan   Total critical care time: 75 minutes Hypercapnic respiratory failure and hypotension  Critical care time was exclusive of separately billable procedures and treating other patients.  Critical care was necessary to treat or prevent imminent or life-threatening deterioration.  Critical care was time spent personally by me on the following activities: development of treatment plan with patient and/or surrogate as well as nursing, discussions with consultants, evaluation of patient's response to treatment, examination of patient, obtaining history from patient or surrogate, ordering and performing treatments and interventions, ordering and review of laboratory studies, ordering and review of radiographic studies, pulse oximetry and re-evaluation of patient's condition.   Pt comes in with cc of increased dib, and has hx of advanced COPD. Pt reports that increased somnolence. Exam shows  mild wheezing, tight chest. ABG shows resp acidosis with hypercapnia and thereby acute respiratory failure.  BiPAP started. Home setting:14/7 - so we started her at 16/7. Pt's BP was 102/57 at arrival -now in the upper 70s/50s with no change in mentation. Suspect that the BP is lower due to the bipap mainly. Fluid bolus ordered, monitoring closely.  No hx of PE, DVT, no signs of DVT, and no risk factors for PE. Pt might have pulm HTN, and resultant stasis in the pulm circulation, thus PE is possible - but currently, we have a working diagnosis of COPD, and dont think a CT PE is mandated, unless she fails to improve.  Step down admission. BP and ABG improved. Mentation - still oriented.  Derwood Kaplan, MD 07/02/14 1517

## 2014-07-03 DIAGNOSIS — E785 Hyperlipidemia, unspecified: Secondary | ICD-10-CM

## 2014-07-03 DIAGNOSIS — J9620 Acute and chronic respiratory failure, unspecified whether with hypoxia or hypercapnia: Secondary | ICD-10-CM

## 2014-07-03 DIAGNOSIS — K219 Gastro-esophageal reflux disease without esophagitis: Secondary | ICD-10-CM

## 2014-07-03 DIAGNOSIS — I10 Essential (primary) hypertension: Secondary | ICD-10-CM

## 2014-07-03 LAB — BASIC METABOLIC PANEL
ANION GAP: 5 (ref 5–15)
BUN: 14 mg/dL (ref 6–23)
CALCIUM: 8.5 mg/dL (ref 8.4–10.5)
CO2: 39 mEq/L — ABNORMAL HIGH (ref 19–32)
Chloride: 92 mEq/L — ABNORMAL LOW (ref 96–112)
Creatinine, Ser: 0.45 mg/dL — ABNORMAL LOW (ref 0.50–1.10)
GFR calc Af Amer: >90 mL/min (ref >90)
GLUCOSE: 96 mg/dL (ref 70–99)
POTASSIUM: 4.7 meq/L (ref 3.7–5.3)
SODIUM: 136 meq/L — AB (ref 137–147)

## 2014-07-03 LAB — BLOOD GAS, ARTERIAL
Acid-Base Excess: 13.6 mmol/L — ABNORMAL HIGH (ref 0.0–2.0)
Bicarbonate: 39.2 mEq/L — ABNORMAL HIGH (ref 20.0–24.0)
DRAWN BY: 41308
Delivery systems: POSITIVE
EXPIRATORY PAP: 8
FIO2: 30 %
INSPIRATORY PAP: 18
O2 SAT: 99.6 %
PH ART: 7.382 (ref 7.350–7.450)
PO2 ART: 109 mmHg — AB (ref 80.0–100.0)
Patient temperature: 98.6
RATE: 14 resp/min
TCO2: 41.3 mmol/L (ref 0–100)
pCO2 arterial: 67.5 mmHg (ref 35.0–45.0)

## 2014-07-03 LAB — HIV ANTIBODY (ROUTINE TESTING W REFLEX): HIV: NONREACTIVE

## 2014-07-03 MED ORDER — CHLORHEXIDINE GLUCONATE 0.12 % MT SOLN
15.0000 mL | Freq: Two times a day (BID) | OROMUCOSAL | Status: DC
  Administered 2014-07-03 – 2014-07-05 (×4): 15 mL via OROMUCOSAL
  Filled 2014-07-03 (×7): qty 15

## 2014-07-03 MED ORDER — CETYLPYRIDINIUM CHLORIDE 0.05 % MT LIQD
7.0000 mL | Freq: Two times a day (BID) | OROMUCOSAL | Status: DC
  Administered 2014-07-04 – 2014-07-05 (×4): 7 mL via OROMUCOSAL

## 2014-07-03 MED ORDER — DIPHENHYDRAMINE HCL 50 MG/ML IJ SOLN
12.5000 mg | Freq: Once | INTRAMUSCULAR | Status: AC
  Administered 2014-07-03: 12.5 mg via INTRAVENOUS
  Filled 2014-07-03: qty 1

## 2014-07-03 MED ORDER — ALBUTEROL SULFATE (2.5 MG/3ML) 0.083% IN NEBU: 2.5000 mg | INHALATION_SOLUTION | RESPIRATORY_TRACT | Status: DC | PRN

## 2014-07-03 MED ORDER — ALBUTEROL SULFATE (2.5 MG/3ML) 0.083% IN NEBU
INHALATION_SOLUTION | RESPIRATORY_TRACT | Status: AC
  Administered 2014-07-03: 2.5 mg
  Filled 2014-07-03: qty 3

## 2014-07-03 MED ORDER — METHYLPREDNISOLONE SODIUM SUCC 125 MG IJ SOLR
60.0000 mg | Freq: Four times a day (QID) | INTRAMUSCULAR | Status: DC
  Administered 2014-07-03 – 2014-07-04 (×5): 60 mg via INTRAVENOUS
  Filled 2014-07-03 (×9): qty 0.96

## 2014-07-03 NOTE — H&P (Signed)
INTERNAL MEDICINE TEACHING ATTENDING NOTE  Day 1 of stay  Patient name: Elizabeth [name]  MRN: [id number] Date of birth: 05/02/1956   Key clinical points and exam                                                          58 y.o.lady with end stage COPD just discharged from the ICU service comes back with hypercarbia, lethargy and worsening of disease. Overnight bipap and steroids and she is clinically improved. This morning, she was on nasal canula 3l, conversing, alert and oriented and in no acute distress. Her lungs sounded clear to auscultation with no wheezes, with low movement of air. She had regular tachycardia, no chest tenderness, no murmurs. Her abdomen was benign and no pedal edema. She was grossly neurologically non-focal.   I have reviewed the chart, lab results, EKG, imaging and relevant notes of this patient.   Assessment and Plan                                                                      COPD exacerbation - Agree with Dr Andee Poles and Dr Gordy Councilman plan. Continue solumedrol 60 mg q6. Continue nebs, continue bipap as needed. When transitioning to nasal canula keep oxygenation between 90-92%. If patient not getting better, consult critical care.   Other chronic issues per resident note. I have seen and evaluated this patient and discussed it with my IM resident team. Please see the rest of the plan per resident note from today.   Signa Cheek 07/03/2014, 10:19 AM.

## 2014-07-03 NOTE — Progress Notes (Addendum)
Subjective: Patient much improved today. She is off BiPAP machine and breathing comfortably on 3L oxygen via [place]. She is saturating 99-100% on 3L. Patient should be saturating at lower rate of 90-92% because of her chronic COPD, will titrate down oxygen level.   Patient reports that she has an appetite. She denies chest pain, palpitations, cough.   Objective: Vital signs in last 24 hours: Filed Vitals:   07/03/14 0344 07/03/14 0744 07/03/14 0745 07/03/14 0830  BP: 101/74   123/70  Pulse: 111  106 118  Temp: 97 F (36.1 C)   98.1 F (36.7 C)  TempSrc: Axillary   Oral  Resp: Height:      Weight:      SpO2: 100% 99% 100% 99%   Weight change:   Intake/Output Summary (Last 24 hours) at 07/03/14 1155 Last data filed at 07/03/14 0600  Gross per 24 hour  Intake 3487.46 ml  Output    200 ml  Net 3287.46 ml   Physical Exam: General: laying in bed, breathing comfortably on oxygen via [place] HEENT: [place]/AT, EOMI, mucus membranes moist Neck: supple, no JVD  CV: tachycardic, normal S1/S2, no m/g/r  Pulm: mild wheezes heard bilaterally, much improved compared to yesterday, still not moving large volumes of air Abd: BS+, soft, non-tender  Msk: warm, no edema  Neuro: alert and oriented x 3, conversant, pleasant   Lab Results: Basic Metabolic Panel:  Recent Labs Lab 07/02/14 1048 07/03/14 0305  NA 139 136*  K 4.2 4.7  CL 88* 92*  CO2 >45* 39*  GLUCOSE 97 96  BUN 11 14  CREATININE 0.47* 0.45*  CALCIUM 8.7 8.5   Liver Function Tests: No results found for this basename: AST, ALT, ALKPHOS, BILITOT, PROT, ALBUMIN,  in the last 168 hours No results found for this basename: LIPASE, AMYLASE,  in the last 168 hours No results found for this basename: AMMONIA,  in the last 168 hours CBC:  Recent Labs Lab 07/02/14 1048  WBC 10.0  NEUTROABS 5.4  HGB 10.6*  HCT 37.9  MCV 107.1*  PLT 213   Cardiac Enzymes:  Recent Labs Lab 07/02/14 1048  TROPONINI <0.30    BNP:  Recent Labs Lab 07/02/14 1048  PROBNP 27.0   D-Dimer: No results found for this basename: DDIMER,  in the last 168 hours CBG: No results found for this basename: GLUCAP,  in the last 168 hours Hemoglobin A1C: No results found for this basename: HGBA1C,  in the last 168 hours Fasting Lipid Panel: No results found for this basename: CHOL, HDL, LDLCALC, TRIG, CHOLHDL, LDLDIRECT,  in the last 168 hours Thyroid Function Tests: No results found for this basename: TSH, T4TOTAL, FREET4, T3FREE, THYROIDAB,  in the last 168 hours Coagulation: No results found for this basename: LABPROT, INR,  in the last 168 hours Anemia Panel: No results found for this basename: VITAMINB12, FOLATE, FERRITIN, TIBC, IRON, RETICCTPCT,  in the last 168 hours Urine Drug Screen: Drugs of Abuse  No results found for this basename: labopia, cocainscrnur, labbenz, amphetmu, thcu, labbarb    Alcohol Level: No results found for this basename: ETH,  in the last 168 hours Urinalysis: No results found for this basename: COLORURINE, APPERANCEUR, LABSPEC, PHURINE, GLUCOSEU, HGBUR, BILIRUBINUR, KETONESUR, PROTEINUR, UROBILINOGEN, NITRITE, LEUKOCYTESUR,  in the last 168 hours   Micro Results: No results found for this or any previous visit (from the past 240 hour(s)). Studies/Results: Dg Chest Port 1 View  07/02/2014  CLINICAL DATA:  Labored breathing, COPD  EXAM: PORTABLE CHEST - 1 VIEW  COMPARISON:  06/16/2014  FINDINGS: Chronic interstitial markings/emphysematous changes. No focal consolidation. No pleural effusion or pneumothorax.  The heart is normal in size.  IMPRESSION: No evidence of acute cardiopulmonary disease.   Electronically Signed   By: Charline Bills M.D.   On: 07/02/2014 11:13   Dg Knee Left Port  07/02/2014   CLINICAL DATA:  Fall, left knee pain  EXAM: PORTABLE LEFT KNEE - 1-2 VIEW  COMPARISON:  None.  FINDINGS: There is no evidence of fracture, dislocation, or joint effusion. There is no  evidence of arthropathy or other focal bone abnormality. Soft tissues are unremarkable. Lateral view is suboptimal due to flexion of the knee.  IMPRESSION: Negative.   Electronically Signed   By: Christiana Pellant M.D.   On: 07/02/2014 12:51   Medications: I have reviewed the patient's current medications. Scheduled Meds: . antiseptic oral rinse  7 mL Mouth Rinse q12n4p  . chlorhexidine  15 mL Mouth Rinse BID  . heparin  5,000 Units Subcutaneous 3 times per day  . ipratropium-albuterol  3 mL Nebulization TID  . levofloxacin (LEVAQUIN) IV  750 mg Intravenous Q24H  . methylPREDNISolone (SOLU-MEDROL) injection  60 mg Intravenous 4 times per day   Continuous Infusions: . sodium chloride 1,000 mL (07/03/14 0007)   PRN Meds:. Assessment/Plan: Elizabeth [name] is a 58yo woman w/ PMHx of end-stage COPD and chronic hypercarbic/hypoxic respiratory failure, HTN, GERD, and HLD who presented to the ED with 2-3 day hx of worsening dyspnea due to COPD exacerbation.   1. Acute on Chronic Hypercarbic/Hypoxic Respiratory Failure: Patient with hx of end-stage COPD and multiple hospitalizations in last month due to respiratory distress/COPD exacerbations. Presented yesterday with respiratory failure due to COPD exacerbation. She is much improved this morning, saturating well on 3L oxygen via [place]. She is still not moving large volumes of air and has wheezing on exam. She is conversant today and able to carry on conversation without respiratory distress. - Continue Levofloxacin 750 mg IV  - Continue Duonebs Q4H  - Continue Solu-medrol, increase to 60 mg IV Q6H - Bed rest  - BiPAP at night - Pulse oximetry  - bmet tomorrow AM   2. HTN: Pt presented with hypotension in 70-80s/50-60s. Pt takes Metoprolol 12.5 mg BID at home. BPs improved to 120/70 this AM.  - Will continue to hold Metoprolol given low BPs  - Continue to monitor vitals   3. GERD: Patient takes Protonix 40 mg BID at home.  - Will hold Protonix for now   - Resume once patient able to eat again   4. HLD: Pt takes Simvastatin 20 mg daily at home.  - Will resume once pt tolerating PO intake  Diet: Regular DVT/PE PPx: Heparin SQ  Dispo: Disposition is deferred at this time, awaiting improvement of current medical problems. Anticipated discharge in approximately 1-2 day(s).   The patient does have a current PCP (Toma Deiters, MD) and does need an [facility] hospital follow-up appointment after discharge.  The patient does not have transportation limitations that hinder transportation to clinic appointments.  .Services Needed at time of discharge: Y = Yes, Blank = No PT:   OT:   RN:   Equipment:   Other:     LOS: 1 day   Rich Number, MD 07/03/2014, 11:55 AM

## 2014-07-04 LAB — BASIC METABOLIC PANEL
Anion gap: 6 (ref 5–15)
BUN: 10 mg/dL (ref 6–23)
CO2: 35 mEq/L — ABNORMAL HIGH (ref 19–32)
Calcium: 8.6 mg/dL (ref 8.4–10.5)
Chloride: 103 mEq/L (ref 96–112)
Creatinine, Ser: 0.39 mg/dL — ABNORMAL LOW (ref 0.50–1.10)
GFR calc Af Amer: >90 mL/min (ref >90)
Glucose, Bld: 147 mg/dL — ABNORMAL HIGH (ref 70–99)
POTASSIUM: 4.9 meq/L (ref 3.7–5.3)
Sodium: 144 mEq/L (ref 137–147)

## 2014-07-04 MED ORDER — ALENDRONATE SODIUM 70 MG PO TABS: 70.0000 mg | ORAL_TABLET | ORAL | Status: DC

## 2014-07-04 MED ORDER — IPRATROPIUM-ALBUTEROL 0.5-2.5 (3) MG/3ML IN SOLN
3.0000 mL | RESPIRATORY_TRACT | Status: DC | PRN
  Administered 2014-07-05: 3 mL via RESPIRATORY_TRACT
  Filled 2014-07-04: qty 3

## 2014-07-04 MED ORDER — TIOTROPIUM BROMIDE MONOHYDRATE 18 MCG IN CAPS
18.0000 ug | ORAL_CAPSULE | Freq: Every day | RESPIRATORY_TRACT | Status: DC
  Administered 2014-07-04 – 2014-07-05 (×2): 18 ug via RESPIRATORY_TRACT
  Filled 2014-07-04: qty 5

## 2014-07-04 MED ORDER — VALACYCLOVIR HCL 500 MG PO TABS
500.0000 mg | ORAL_TABLET | Freq: Every morning | ORAL | Status: DC
  Administered 2014-07-04 – 2014-07-05 (×2): 500 mg via ORAL
  Filled 2014-07-04 (×2): qty 1

## 2014-07-04 MED ORDER — ARFORMOTEROL TARTRATE 15 MCG/2ML IN NEBU
15.0000 ug | INHALATION_SOLUTION | Freq: Two times a day (BID) | RESPIRATORY_TRACT | Status: DC
  Administered 2014-07-04 – 2014-07-05 (×3): 15 ug via RESPIRATORY_TRACT
  Filled 2014-07-04 (×4): qty 2

## 2014-07-04 MED ORDER — LORAZEPAM 0.5 MG PO TABS
0.2500 mg | ORAL_TABLET | Freq: Two times a day (BID) | ORAL | Status: DC
  Administered 2014-07-04 – 2014-07-05 (×3): 0.25 mg via ORAL
  Filled 2014-07-04 (×3): qty 1

## 2014-07-04 MED ORDER — ALBUTEROL SULFATE (2.5 MG/3ML) 0.083% IN NEBU: 2.5000 mg | INHALATION_SOLUTION | RESPIRATORY_TRACT | Status: DC | PRN

## 2014-07-04 MED ORDER — ENSURE COMPLETE PO LIQD
237.0000 mL | Freq: Two times a day (BID) | ORAL | Status: DC
  Administered 2014-07-04 – 2014-07-05 (×4): 237 mL via ORAL

## 2014-07-04 MED ORDER — METOPROLOL TARTRATE 12.5 MG HALF TABLET
12.5000 mg | ORAL_TABLET | Freq: Two times a day (BID) | ORAL | Status: DC
  Administered 2014-07-04 (×2): 12.5 mg via ORAL
  Filled 2014-07-04 (×5): qty 1

## 2014-07-04 MED ORDER — PREDNISONE 50 MG PO TABS
60.0000 mg | ORAL_TABLET | Freq: Two times a day (BID) | ORAL | Status: DC
  Administered 2014-07-04 – 2014-07-05 (×3): 60 mg via ORAL
  Filled 2014-07-04 (×4): qty 1

## 2014-07-04 MED ORDER — SIMVASTATIN 20 MG PO TABS
20.0000 mg | ORAL_TABLET | Freq: Every day | ORAL | Status: DC
  Administered 2014-07-04 – 2014-07-05 (×2): 20 mg via ORAL
  Filled 2014-07-04 (×2): qty 1

## 2014-07-04 MED ORDER — PANTOPRAZOLE SODIUM 40 MG PO TBEC
40.0000 mg | DELAYED_RELEASE_TABLET | Freq: Two times a day (BID) | ORAL | Status: DC
  Administered 2014-07-04 – 2014-07-05 (×3): 40 mg via ORAL
  Filled 2014-07-04 (×3): qty 1

## 2014-07-04 MED ORDER — ASPIRIN EC 81 MG PO TBEC
81.0000 mg | DELAYED_RELEASE_TABLET | Freq: Every day | ORAL | Status: DC
  Administered 2014-07-04 – 2014-07-05 (×2): 81 mg via ORAL
  Filled 2014-07-04 (×2): qty 1

## 2014-07-04 MED ORDER — ALBUTEROL SULFATE HFA 108 (90 BASE) MCG/ACT IN AERS: 2.0000 | INHALATION_SPRAY | RESPIRATORY_TRACT | Status: DC | PRN

## 2014-07-04 MED ORDER — BUDESONIDE 0.5 MG/2ML IN SUSP
0.5000 mg | Freq: Two times a day (BID) | RESPIRATORY_TRACT | Status: DC
  Administered 2014-07-04 (×2): 0.5 mg via RESPIRATORY_TRACT
  Filled 2014-07-04 (×5): qty 2

## 2014-07-04 MED ORDER — ASPIRIN 81 MG PO TABS
81.0000 mg | ORAL_TABLET | Freq: Every day | ORAL | Status: DC
  Filled 2014-07-04: qty 1

## 2014-07-04 NOTE — Progress Notes (Signed)
Utilization Review Completed.  

## 2014-07-04 NOTE — Progress Notes (Signed)

## 2014-07-04 NOTE — Evaluation (Signed)
Physical Therapy Evaluation Patient Details Name: Elizabeth [name] MRN: [id number] DOB: 03-07-1956 Today's Date: 07/04/2014   History of Present Illness  Pt adm with resp failure due to COPD exacerbation.  Clinical Impression  Pt admitted with above. Pt currently with functional limitations due to the deficits listed below (see PT Problem List).  Pt will benefit from skilled PT to increase their independence and safety with mobility to allow discharge home with family. Feel pt can return home with family support. Pt verbalizes understanding of pacing herself and performing very  short bouts of activity. Feel pt could benefit from [facility] to address energy conservation as well as HHPT.      Follow Up Recommendations Home health PT;Supervision - Intermittent;Other (comment) (HHOT)    Equipment Recommendations  None recommended by PT    Recommendations for Other Services       Precautions / Restrictions Precautions Precautions: Fall      Mobility  Bed Mobility Overal bed mobility: Modified Independent Bed Mobility: Sit to Supine       Sit to supine: Modified independent (Device/Increase time)      Transfers Overall transfer level: Modified independent Equipment used: Rolling walker (2 wheeled)                Ambulation/Gait Ambulation/Gait assistance: Supervision Ambulation Distance (Feet): 20 Feet Assistive device: Rolling walker (2 wheeled) Gait Pattern/deviations: Step-through pattern;Decreased step length - right;Decreased step length - left Gait velocity: decr Gait velocity interpretation: Below normal speed for age/gender General Gait Details: Pt with fairly steady gait using walker. SaO2 >94% on 3L and HR in the low 120's with activity.  Stairs            Wheelchair Mobility    Modified Rankin (Stroke Patients Only)       Balance Overall balance assessment: Needs assistance Sitting-balance support: No upper extremity supported Sitting  balance-Leahy Scale: Good     Standing balance support: No upper extremity supported Standing balance-Leahy Scale: Fair                               Pertinent Vitals/Pain Pain Assessment: No/denies pain    Home Living Family/patient expects to be discharged to:: Private residence Living Arrangements: Spouse/significant other Available Help at Discharge: Available PRN/intermittently Type of Home: House Home Access: Stairs to enter   [employer] of Steps: 2 Home Layout: One level Home Equipment: Walker - 2 wheels;Bedside commode      Prior Function Level of Independence: Needs assistance   Gait / Transfers Assistance Needed: Pt amb household distances with walker  ADL's / Homemaking Assistance Needed: Assist needed due to limited activity tolerance.        Hand Dominance        Extremity/Trunk Assessment   Upper Extremity Assessment: Generalized weakness           Lower Extremity Assessment: Generalized weakness         Communication   Communication: No difficulties  Cognition Arousal/Alertness: Awake/alert Behavior During Therapy: WFL for tasks assessed/performed Overall Cognitive Status: Within Functional Limits for tasks assessed                      General Comments      Exercises        Assessment/Plan    PT Assessment Patient needs continued PT services  PT Diagnosis Difficulty walking;Generalized weakness   PT Problem List Decreased strength;Decreased activity  tolerance;Decreased balance;Decreased mobility;Cardiopulmonary status limiting activity  PT Treatment Interventions DME instruction;Gait training;Patient/family education;Functional mobility training;Therapeutic activities;Therapeutic exercise;Balance training   PT Goals (Current goals can be found in the Care Plan section) Acute Rehab PT Goals Patient Stated Goal: return home PT Goal Formulation: With patient Time For Goal Achievement:  07/11/14 Potential to Achieve Goals: Good    Frequency Min 3X/week   Barriers to discharge        Co-evaluation               End of Session   Activity Tolerance: Patient limited by fatigue Patient left: in bed;with call bell/phone within reach;with family/visitor present Nurse Communication: Mobility status         Time: 1550-1605 PT Time Calculation (min): 15 min   Charges:   PT Evaluation $Initial PT Evaluation Tier I: 1 Procedure     PT G Codes:          Carnelia Oscar 2014/07/24, 4:24 PM  [facility] PT [phone]

## 2014-07-04 NOTE — Progress Notes (Addendum)
Subjective: Patient reports she slept well overnight with BiPAP. She states she is feeling much better today and would like to go home today if possible. She is saturating well on 1 L oxygen via [place]. Patient states she is feeling anxious and would like her home anxiety medications. Will restart home meds today.  Objective: Vital signs in last 24 hours: Filed Vitals:   07/04/14 0331 07/04/14 0410 07/04/14 0730 07/04/14 0800  BP: 114/66 122/76    Pulse: 95 103 109   Temp:  97.5 F (36.4 C)  97.5 F (36.4 C)  TempSrc:  Oral  Oral  Resp: Height:      Weight:      SpO2: 98% 99% 100%    Weight change:   Intake/Output Summary (Last 24 hours) at 07/04/14 0949 Last data filed at 07/04/14 0900  Gross per 24 hour  Intake   3645 ml  Output    975 ml  Net   2670 ml   Physical Exam: General: sitting up in bed, breathing comfortably on 2 L oxygen via [place], anxious HEENT: [place]/AT, EOMI, mucus membranes moist Neck: supple, no JVD CV: tachycardic, normal S1/S2, no m/g/r Pulm: no wheezes heard, moving larger volumes of air compared to yesterday Abd: BS+, soft, non-tender Msk: warm, no edema Neuro: alert and oriented x 3, conversant, CNs II-XII intact  Lab Results: Basic Metabolic Panel:  Recent Labs Lab 07/03/14 0305 07/04/14 0822  NA 136* 144  K 4.7 4.9  CL 92* 103  CO2 39* 35*  GLUCOSE 96 147*  BUN 14 10  CREATININE 0.45* 0.39*  CALCIUM 8.5 8.6   Liver Function Tests: No results found for this basename: AST, ALT, ALKPHOS, BILITOT, PROT, ALBUMIN,  in the last 168 hours No results found for this basename: LIPASE, AMYLASE,  in the last 168 hours No results found for this basename: AMMONIA,  in the last 168 hours CBC:  Recent Labs Lab 07/02/14 1048  WBC 10.0  NEUTROABS 5.4  HGB 10.6*  HCT 37.9  MCV 107.1*  PLT 213   Cardiac Enzymes:  Recent Labs Lab 07/02/14 1048  TROPONINI <0.30   BNP:  Recent Labs Lab 07/02/14 1048  PROBNP 27.0   D-Dimer: No  results found for this basename: DDIMER,  in the last 168 hours CBG: No results found for this basename: GLUCAP,  in the last 168 hours Hemoglobin A1C: No results found for this basename: HGBA1C,  in the last 168 hours Fasting Lipid Panel: No results found for this basename: CHOL, HDL, LDLCALC, TRIG, CHOLHDL, LDLDIRECT,  in the last 168 hours Thyroid Function Tests: No results found for this basename: TSH, T4TOTAL, FREET4, T3FREE, THYROIDAB,  in the last 168 hours Coagulation: No results found for this basename: LABPROT, INR,  in the last 168 hours Anemia Panel: No results found for this basename: VITAMINB12, FOLATE, FERRITIN, TIBC, IRON, RETICCTPCT,  in the last 168 hours Urine Drug Screen: Drugs of Abuse  No results found for this basename: labopia, cocainscrnur, labbenz, amphetmu, thcu, labbarb    Alcohol Level: No results found for this basename: ETH,  in the last 168 hours Urinalysis: No results found for this basename: COLORURINE, APPERANCEUR, LABSPEC, PHURINE, GLUCOSEU, HGBUR, BILIRUBINUR, KETONESUR, PROTEINUR, UROBILINOGEN, NITRITE, LEUKOCYTESUR,  in the last 168 hours   Micro Results: No results found for this or any previous visit (from the past 240 hour(s)). Studies/Results: Dg Chest Port 1 View  07/02/2014   CLINICAL DATA:  Labored breathing,  COPD  EXAM: PORTABLE CHEST - 1 VIEW  COMPARISON:  06/16/2014  FINDINGS: Chronic interstitial markings/emphysematous changes. No focal consolidation. No pleural effusion or pneumothorax.  The heart is normal in size.  IMPRESSION: No evidence of acute cardiopulmonary disease.   Electronically Signed   By: Charline Bills M.D.   On: 07/02/2014 11:13   Dg Knee Left Port  07/02/2014   CLINICAL DATA:  Fall, left knee pain  EXAM: PORTABLE LEFT KNEE - 1-2 VIEW  COMPARISON:  None.  FINDINGS: There is no evidence of fracture, dislocation, or joint effusion. There is no evidence of arthropathy or other focal bone abnormality. Soft tissues are  unremarkable. Lateral view is suboptimal due to flexion of the knee.  IMPRESSION: Negative.   Electronically Signed   By: Christiana Pellant M.D.   On: 07/02/2014 12:51   Medications: I have reviewed the patient's current medications. Scheduled Meds: . antiseptic oral rinse  7 mL Mouth Rinse q12n4p  . aspirin  81 mg Oral Daily  . chlorhexidine  15 mL Mouth Rinse BID  . heparin  5,000 Units Subcutaneous 3 times per day  . ipratropium-albuterol  3 mL Nebulization TID  . levofloxacin (LEVAQUIN) IV  750 mg Intravenous Q24H  . LORazepam  0.25 mg Oral BID  . methylPREDNISolone (SOLU-MEDROL) injection  60 mg Intravenous 4 times per day  . pantoprazole  40 mg Oral BID AC  . simvastatin  20 mg Oral q1800   Continuous Infusions: . sodium chloride 1,000 mL (07/04/14 0412)   PRN Meds:.albuterol Assessment/Plan: Mrs. Idleman is a 58yo woman w/ PMHx of end-stage COPD and chronic hypercarbic/hypoxic respiratory failure, HTN, GERD, and HLD who presented to the ED with 2-3 day hx of worsening dyspnea due to COPD exacerbation.   1. Acute on Chronic Hypercarbic/Hypoxic Respiratory Failure: Patient with hx of end-stage COPD and multiple hospitalizations in last month due to respiratory distress/COPD exacerbations. Presented with respiratory failure due to COPD exacerbation. She is much improved this morning, saturating well on 1-2L oxygen via [place]. She is moving larger volumes of air this AM and no longer has wheezing. She is conversant today and able to carry on conversation without respiratory distress.  - Continue Levofloxacin 750 mg IV  - Start home meds today--> Brovana 15 mcg BID, Pulmicort 0.5 mg BID, and Spiriva 18 mcg daily - Switch Duonebs to Q4H PRN - Switch from Solu-medrol to Prednisone PO, 60 mg BID today and then begin taper  - Bed rest  - BiPAP at night  - Pulse oximetry   2. HTN: Pt presented with hypotension in 70-80s/50-60s. Pt takes Metoprolol 12.5 mg BID at home. BPs improved to 120/70 this  AM.  - Will continue to hold Metoprolol given low BPs  - Continue to monitor vitals   3. GERD: Patient takes Protonix 40 mg BID at home.  - Protonix restarted today  4. HLD: Pt takes Simvastatin 20 mg daily at home.  - Start Simvastatin today  Diet: Regular  DVT/PE PPx: Heparin SQ  Dispo: Discharge likely today.   The patient does have a current PCP (Toma Deiters, MD) and does need an [facility] hospital follow-up appointment after discharge.  The patient does not have transportation limitations that hinder transportation to clinic appointments.  .Services Needed at time of discharge: Y = Yes, Blank = No PT:   OT:   RN:   Equipment:   Other:     LOS: 2 days   Rich Number, MD 07/04/2014, 9:49 AM

## 2014-07-05 LAB — CBC
HCT: 31.8 % — ABNORMAL LOW (ref 36.0–46.0)
Hemoglobin: 9.2 g/dL — ABNORMAL LOW (ref 12.0–15.0)
MCH: 29 pg (ref 26.0–34.0)
MCHC: 28.9 g/dL — AB (ref 30.0–36.0)
MCV: 100.3 fL — ABNORMAL HIGH (ref 78.0–100.0)
Platelets: 171 10*3/uL (ref 150–400)
RBC: 3.17 MIL/uL — ABNORMAL LOW (ref 3.87–5.11)
RDW: 15.7 % — ABNORMAL HIGH (ref 11.5–15.5)
WBC: 13.6 10*3/uL — ABNORMAL HIGH (ref 4.0–10.5)

## 2014-07-05 LAB — BASIC METABOLIC PANEL
Anion gap: 5 (ref 5–15)
BUN: 10 mg/dL (ref 6–23)
CO2: 41 meq/L — AB (ref 19–32)
CREATININE: 0.49 mg/dL — AB (ref 0.50–1.10)
Calcium: 8.5 mg/dL (ref 8.4–10.5)
Chloride: 98 mEq/L (ref 96–112)
GFR calc Af Amer: >90 mL/min (ref >90)
GFR calc non Af Amer: >90 mL/min (ref >90)
Glucose, Bld: 105 mg/dL — ABNORMAL HIGH (ref 70–99)
Potassium: 4.3 mEq/L (ref 3.7–5.3)
Sodium: 144 mEq/L (ref 137–147)

## 2014-07-05 LAB — ALPHA-1 ANTITRYPSIN PHENOTYPE: A-1 Antitrypsin: 123 mg/dL (ref 83–199)

## 2014-07-05 MED ORDER — CARVEDILOL 3.125 MG PO TABS
3.1250 mg | ORAL_TABLET | Freq: Two times a day (BID) | ORAL | Status: DC
Start: 2014-07-05 — End: 2014-07-05
  Filled 2014-07-05 (×2): qty 1

## 2014-07-05 MED ORDER — PREDNISONE 10 MG PO TABS: ORAL_TABLET | ORAL | Status: DC

## 2014-07-05 MED ORDER — METOPROLOL TARTRATE 12.5 MG HALF TABLET: 12.5000 mg | ORAL_TABLET | Freq: Two times a day (BID) | ORAL | Status: DC

## 2014-07-05 MED ORDER — CARVEDILOL 3.125 MG PO TABS
3.1250 mg | ORAL_TABLET | Freq: Two times a day (BID) | ORAL | Status: DC
  Administered 2014-07-05: 3.125 mg via ORAL
  Filled 2014-07-05 (×3): qty 1

## 2014-07-05 NOTE — Progress Notes (Signed)
Pt seen and examined. Please refer to resident note for details  Pt feels well this AM. No wheezing. No CP. Wants to go home  Exam: Lungs: CTA b/l. No wheezes Cardio-mildly tachycardic, normal heart sounds Abd- soft, NT, ND, BS+ Ext- No pedal edema Gen: AAO*3, NAD  Assessment and Plan: 58 y/o female with acute COPD exacerbation  Acute on Chronic Hypercarbic resp failure secondary to COPD: - Pt much improved now - Stable for d/c home today - c/w nebs, prednisone taper, BIPAP at night - Change levaquin to PO and complete 5 day course  HTN, sinus tachycardia: - Would c/w metoprolol for now (only B1 blocker). D/c carvedilol - Would consider tapering pt off beta blocker and starting another medication for HTN as an outpatient

## 2014-07-05 NOTE — Progress Notes (Signed)
Subjective: Patient reports she is feeling good this AM and slept well overnight with BiPAP. Tachycardia has improved to 100s since restarting home Metoprolol. Will change to Carvedilol today.  Objective: Vital signs in last 24 hours: Filed Vitals:   07/05/14 0317 07/05/14 0432 07/05/14 0500 07/05/14 0848  BP:  112/79  110/65  Pulse: 84 102  102  Temp:  97.8 F (36.6 C)  98 F (36.7 C)  TempSrc:  Axillary  Axillary  Resp: Height:      Weight:      SpO2: 98% 93% 98% 100%   Weight change:   Intake/Output Summary (Last 24 hours) at 07/05/14 0927 Last data filed at 07/05/14 0810  Gross per 24 hour  Intake   2115 ml  Output   1225 ml  Net    890 ml   Physical Exam: General: sitting up in bed, breathing comfortably on 3 L oxygen via [place]  HEENT: [place]/AT, EOMI, mucus membranes moist  Neck: supple, no JVD  CV: mildly tachycardic in high 90s- low 100s, normal S1/S2, no m/g/r  Pulm: no wheezes heard, moving larger volumes of air compared to yesterday  Abd: BS+, soft, non-tender  Msk: warm, no edema  Neuro: alert and oriented x 3, conversant, CNs II-XII intact  Lab Results: Basic Metabolic Panel:  Recent Labs Lab 07/04/14 0822 07/05/14 0512  NA 144 144  K 4.9 4.3  CL 103 98  CO2 35* 41*  GLUCOSE 147* 105*  BUN 10 10  CREATININE 0.39* 0.49*  CALCIUM 8.6 8.5   Liver Function Tests: No results found for this basename: AST, ALT, ALKPHOS, BILITOT, PROT, ALBUMIN,  in the last 168 hours No results found for this basename: LIPASE, AMYLASE,  in the last 168 hours No results found for this basename: AMMONIA,  in the last 168 hours CBC:  Recent Labs Lab 07/02/14 1048 07/05/14 0512  WBC 10.0 13.6*  NEUTROABS 5.4  --   HGB 10.6* 9.2*  HCT 37.9 31.8*  MCV 107.1* 100.3*  PLT 213 171   Cardiac Enzymes:  Recent Labs Lab 07/02/14 1048  TROPONINI <0.30   BNP:  Recent Labs Lab 07/02/14 1048  PROBNP 27.0   D-Dimer: No results found for this basename:  DDIMER,  in the last 168 hours CBG: No results found for this basename: GLUCAP,  in the last 168 hours Hemoglobin A1C: No results found for this basename: HGBA1C,  in the last 168 hours Fasting Lipid Panel: No results found for this basename: CHOL, HDL, LDLCALC, TRIG, CHOLHDL, LDLDIRECT,  in the last 168 hours Thyroid Function Tests: No results found for this basename: TSH, T4TOTAL, FREET4, T3FREE, THYROIDAB,  in the last 168 hours Coagulation: No results found for this basename: LABPROT, INR,  in the last 168 hours Anemia Panel: No results found for this basename: VITAMINB12, FOLATE, FERRITIN, TIBC, IRON, RETICCTPCT,  in the last 168 hours Urine Drug Screen: Drugs of Abuse  No results found for this basename: labopia, cocainscrnur, labbenz, amphetmu, thcu, labbarb    Alcohol Level: No results found for this basename: ETH,  in the last 168 hours Urinalysis: No results found for this basename: COLORURINE, APPERANCEUR, LABSPEC, PHURINE, GLUCOSEU, HGBUR, BILIRUBINUR, KETONESUR, PROTEINUR, UROBILINOGEN, NITRITE, LEUKOCYTESUR,  in the last 168 hours   Micro Results: No results found for this or any previous visit (from the past 240 hour(s)). Studies/Results: No results found. Medications: I have reviewed the patient's current medications. Scheduled Meds: . antiseptic oral rinse  7 mL Mouth Rinse q12n4p  . arformoterol  15 mcg Nebulization BID  . aspirin EC  81 mg Oral Daily  . budesonide  0.5 mg Nebulization BID  . carvedilol  3.125 mg Oral BID WC  . chlorhexidine  15 mL Mouth Rinse BID  . feeding supplement (ENSURE COMPLETE)  237 mL Oral BID BM  . heparin  5,000 Units Subcutaneous 3 times per day  . levofloxacin (LEVAQUIN) IV  750 mg Intravenous Q24H  . LORazepam  0.25 mg Oral BID  . pantoprazole  40 mg Oral BID AC  . predniSONE  60 mg Oral BID WC  . simvastatin  20 mg Oral q1800  . tiotropium  18 mcg Inhalation Daily  . valACYclovir  500 mg Oral q morning - 10a   Continuous  Infusions:  PRN Meds:.albuterol, ipratropium-albuterol Assessment/Plan: Mrs. Balbi is a 58yo woman w/ PMHx of end-stage COPD and chronic hypercarbic/hypoxic respiratory failure, HTN, GERD, and HLD who presented to the ED with 2-3 day hx of worsening dyspnea due to COPD exacerbation.   1. Acute on Chronic Hypercarbic/Hypoxic Respiratory Failure: Patient with hx of end-stage COPD and multiple hospitalizations in last month due to respiratory distress/COPD exacerbations. Presented with respiratory failure due to COPD exacerbation. She continues to improve, breathing comfortably on 3L oxygen via [place]. She is moving larger volumes of air this AM and no longer has wheezing. Pt had tachycardia in 140s yesterday. Her home Metoprolol was restarted and tachycardia has now improved to high 90s-low 100s. She is conversant and able to carry on conversation without respiratory distress.  - Continue Levofloxacin 750 mg IV  - Continue Brovana 15 mcg BID, Pulmicort 0.5 mg BID, and Spiriva 18 mcg daily  - Continue Duonebs to Q4H PRN  - Continue Prednisone PO, 60 mg daily today and then begin taper  - Bed rest  - BiPAP at night  - Pulse oximetry   2. HTN: Pt presented with hypotension in 70-80s/50-60s. Pt takes Metoprolol 12.5 mg BID at home. BPs improved to 120/70 this AM. However, patient had tachycardia in 140s yesterday afternoon. Metoprolol was resumed and tachycardia improved to high 90s- low 100s. Will change Metoprolol 12.5 mg BID to Carvedilol 3.125 mg BID since Carvedilol is more beta selective.  - Start Carvedilol 3.125 mg BID - Continue to monitor vitals   3. GERD: Patient takes Protonix 40 mg BID at home.  - Continue Protonix 40 mg BID  4. HLD: Pt takes Simvastatin 20 mg daily at home.  - Continue Simvastatin   Diet: Regular  DVT/PE PPx: Heparin SQ  Dispo: Discharge likely today.   The patient does have a current PCP (Toma Deiters, MD) and does need an [facility] hospital follow-up appointment after  discharge.  The patient does not have transportation limitations that hinder transportation to clinic appointments.  .Services Needed at time of discharge: Y = Yes, Blank = No PT:   OT:   RN:   Equipment:   Other:     LOS: 3 days   Rich Number, MD 07/05/2014, 9:27 AM

## 2014-07-05 NOTE — Discharge Instructions (Signed)
It was a pleasure taking care of you, Elizabeth [name].  - Continue your home Metoprolol 12.5 mg twice a day until follow up appointment with your primary care doctor - Follow directions for Prednisone taper

## 2014-07-06 NOTE — Discharge Summary (Signed)
Name: Elizabeth [name] MRN: [id number] DOB: 1956/10/05 58 y.o. PCP: Toma Deiters, MD  Date of Admission: 07/02/2014  9:55 AM Date of Discharge: 07/06/2014 Attending Physician: Dr. Earl Lagos  Discharge Diagnosis: 1.  Principal Problem:   Acute on chronic respiratory failure with hypercapnia Active Problems:   COPD with acute exacerbation   Acute respiratory failure with hypercapnia  Discharge Medications:   Medication List         PROAIR HFA 108 (90 BASE) MCG/ACT inhaler  Generic drug:  albuterol  Inhale 2 puffs into the lungs every 4 (four) hours as needed for wheezing or shortness of breath.     albuterol (2.5 MG/3ML) 0.083% nebulizer solution  Commonly known as:  PROVENTIL  Take 3 mLs (2.5 mg total) by nebulization every 4 (four) hours as needed for shortness of breath.     alendronate 70 MG tablet  Commonly known as:  FOSAMAX  Take 70 mg by mouth every Wednesday. Take with a full glass of water on an empty stomach.     arformoterol 15 MCG/2ML Nebu  Commonly known as:  BROVANA  Take 2 mLs (15 mcg total) by nebulization 2 (two) times daily.     aspirin 81 MG tablet  Take 81 mg by mouth daily.     budesonide 0.5 MG/2ML nebulizer solution  Commonly known as:  PULMICORT  Take 2 mLs (0.5 mg total) by nebulization 2 (two) times daily.     feeding supplement (ENSURE COMPLETE) Liqd  Take 237 mLs by mouth 2 (two) times daily between meals.     LORazepam 1 MG tablet  Commonly known as:  ATIVAN  Take 0.5 tablets (0.5 mg total) by mouth 2 (two) times daily as needed for anxiety.     metoprolol tartrate 25 MG tablet  Commonly known as:  LOPRESSOR  Take 12.5 mg by mouth 2 (two) times daily.     pantoprazole 40 MG tablet  Commonly known as:  PROTONIX  Take 1 tablet (40 mg total) by mouth 2 (two) times daily before a meal.     predniSONE 10 MG tablet  Commonly known as:  DELTASONE  Take 4 tabs daily for 3 days, 3 tabs daily for 3 days, 2 tabs daily for 3 days,  and 1 tab daily until follow up appointment with pulmonologist.     simvastatin 20 MG tablet  Commonly known as:  ZOCOR  Take 20 mg by mouth daily at 6 PM.     tiotropium 18 MCG inhalation capsule  Commonly known as:  SPIRIVA  Place 18 mcg into inhaler and inhale daily.     valACYclovir 500 MG tablet  Commonly known as:  VALTREX  Take 500 mg by mouth every morning.        Disposition and follow-up:   Ms.[name] was discharged from [facility] in Good condition.  At the hospital follow up visit please address:  1.  Metoprolol- beta blocker not necessary in this patient, could also be contributing to respiratory distress. Consider tapering down Metoprolol to avoid rebound tachycardia and stopping altogether.   2.  Labs / imaging needed at time of follow-up:   3.  Pending labs/ test needing follow-up: None  Follow-up Appointments: Follow-up Information   Follow up with [facility], MD On 08/04/2014. (At 4:00pm)    Specialty:  Pulmonary Disease   Contact information:   [address] [place] [address] [phone]       Follow up  with Toma Deiters, MD. (Appointment on Sept 10th @ 9AM)    Specialty:  Internal Medicine   Contact information:   [address] [place] [address] [phone]       Discharge Instructions: Discharge Instructions   Diet - low sodium heart healthy    Complete by:  As directed      Increase activity slowly    Complete by:  As directed            Consultations:    Procedures Performed:  Dg Chest Port 1 View  07/02/2014   CLINICAL DATA:  Labored breathing, COPD  EXAM: PORTABLE CHEST - 1 VIEW  COMPARISON:  06/16/2014  FINDINGS: Chronic interstitial markings/emphysematous changes. No focal consolidation. No pleural effusion or pneumothorax.  The heart is normal in size.  IMPRESSION: No evidence of acute cardiopulmonary disease.   Electronically Signed   By: Charline Bills M.D.   On: 07/02/2014 11:13    Dg Chest Port 1 View  06/16/2014   CLINICAL DATA:  Respiratory failure  EXAM: PORTABLE CHEST - 1 VIEW  COMPARISON:  Prior chest x-ray 06/11/2014  FINDINGS: Metallic artifact overlies the chest than the formal multiple cardiac leads. The cardiac and mediastinal contours remain unchanged. Lungs are hyperinflated with areas of emphysematous change and mild central bronchitic change. Bullous emphysema noted in the lower lobes. No focal airspace consolidation, pleural effusion or pneumothorax. No acute osseous abnormality.  IMPRESSION: 1. No acute cardiopulmonary process. 2. Advanced emphysema with lower lobe bullous change.   Electronically Signed   By: Malachy Moan M.D.   On: 06/16/2014 07:36   Dg Chest Port 1 View  06/11/2014   CLINICAL DATA:  Dizziness, shortness of breath, chest pain, history COPD, former smoker, hypertension  EXAM: PORTABLE CHEST - 1 VIEW  COMPARISON:  Portable exam 2022 hr compared 05/31/2014  FINDINGS: Normal heart size, mediastinal contours, and pulmonary vascularity.  Emphysematous changes with minimal central peribronchial thickening consistent with COPD.  No acute infiltrate, pleural effusion or pneumothorax.  Bullous changes at lateral LEFT base unchanged.  No acute osseous findings.  IMPRESSION: COPD changes.  No acute abnormalities.   Electronically Signed   By: Ulyses Southward M.D.   On: 06/11/2014 20:32   Dg Knee Left Port  07/02/2014   CLINICAL DATA:  Fall, left knee pain  EXAM: PORTABLE LEFT KNEE - 1-2 VIEW  COMPARISON:  None.  FINDINGS: There is no evidence of fracture, dislocation, or joint effusion. There is no evidence of arthropathy or other focal bone abnormality. Soft tissues are unremarkable. Lateral view is suboptimal due to flexion of the knee.  IMPRESSION: Negative.   Electronically Signed   By: Christiana Pellant M.D.   On: 07/02/2014 12:51    Admission HPI: Elizabeth [name] is a 58yo woman w/ PMHx of end-stage COPD and chronic hypercarbic/hypoxic respiratory failure,  HTN, GERD, and HLD who presented to the ED with 2-3 day hx of worsening dyspnea. History was obtained through medical records and patient's husband because she was on BiPAP. Patient was recently hospitalized from 8/16-8/20 for acute on chronic respiratory failure due to COPD. She was discharged with instructions to do BiPAP at night and continue with spiriva, bronana, budesonide, and a prednisone taper. Prior to this hospitalization she had been admitted on 8/1-8/7 for respiratory distress requiring intubation. Patient is currently on 3 L oxygen at home. Patient has been complaint with her COPD medications.   Patient's husband reports she had home PT a few days  ago and "overdid it." He states she had worsening shortness of breath, wheezing, and weakness over the last few days since her home PT. He notes she slept all day yesterday and has had decreased appetite. He notes she had chest pain this morning which lasted about 15 minutes. She then started to have shakiness and tremors so the husband brought her into the ED. Patient is a previous 1 pack per day smoker and quit 10 years ago. Patient's husband also notes that she had a fall this morning in the bathroom. Pt fell onto her left knee. Husband states she did not hit her head or lose consciousness. X-ray of left knee showed no fracture or abnormalities.   In the ED, patient was hypotensive with BP in 80s/60s and tachycardic at 115. She was placed on BiPAP with FiO2 40 and was saturating at 93-96%. ABG showed pH 7.29, pCO2 107, pO2 82, bicarb 50, O2 sat 96%.   Hospital Course by problem list:   1. Acute on Chronic Hypercarbic/Hypoxic Respiratory Failure: Patient with hx of end-stage COPD and multiple hospitalizations in last month due to respiratory distress/COPD exacerbations. Presented with respiratory failure due to COPD exacerbation. She was placed on Levofloxacin 750 mg IV for 4 days and started on Prednisone 60 mg BID, then 60 mg daily, and then a  taper. She was started on her home meds (Pulmicort, Brovana, and Spiriva) on 9/7. She continued to improve, breathing comfortably on 3L oxygen via [place]. Her breathing improved with moving larger volumes of air and no longer has wheezing.  She is conversant and able to carry on conversation without respiratory distress. Pt received BiPAP at night and did well. Pt was tachycardic in 140s yesterday, likely due to not being on home Metoprolol. Her home Metoprolol was restarted and tachycardia improved to high 90s-low 100s. Patient could likely benefit from not being on a beta blocker given her multiple hospitalizations for respiratory distress. She was discharged with a Prednisone taper and   2. HTN: Pt presented with hypotension in 70-80s/50-60s. Pt takes Metoprolol 12.5 mg BID at home. BPs improved to 110s-120s/70s. However, patient had tachycardia in 140s yesterday afternoon. Metoprolol was resumed and tachycardia improved to high 90s- low 100s. Patient does not need to be on a beta blocker (see above).   3. GERD: Patient takes Protonix 40 mg BID at home.  This was continued during her hospitalization.  4. HLD: Pt takes Simvastatin 20 mg daily at home. This medication was continued.    Discharge Vitals:   BP 118/68  Pulse 110  Temp(Src) 98 F (36.7 C) (Axillary)  Resp 17  Ht  (1.702 m)  Wt 131 lb 9.8 oz (59.7 kg)  BMI 20.61 kg/m2  SpO2 97% Physical Exam:  General: sitting up in bed, breathing comfortably on 3 L oxygen via [place]  HEENT: [place]/AT, EOMI, mucus membranes moist  Neck: supple, no JVD  CV: mildly tachycardic in high 90s- low 100s, normal S1/S2, no m/g/r  Pulm: no wheezes heard, moving larger volumes of air compared to yesterday  Abd: BS+, soft, non-tender  Msk: warm, no edema  Neuro: alert and oriented x 3, conversant, CNs II-XII intact   Discharge Labs:  No results found for this or any previous visit (from the past 24 hour(s)).  Signed: Rich Number, MD 07/06/2014, 12:01 PM     Services Ordered on Discharge: Home PT, home RN, home respiratory Equipment Ordered on Discharge: None

## 2014-07-07 ENCOUNTER — Encounter: Payer: Self-pay | Admitting: Adult Health

## 2014-07-08 ENCOUNTER — Telehealth: Payer: Self-pay | Admitting: Internal Medicine

## 2014-07-08 NOTE — Telephone Encounter (Signed)
Agree with plan. We can just do an office spirometry at visit

## 2014-07-08 NOTE — Telephone Encounter (Signed)
Had called to speak with patient to discuss Alpha-1 results - pt advised and voiced understanding While on the phone with patient she reported that she will not be able to do the pending 10.8.15 PFT prior to seeing MR on same day.  Pt stated that she is physically unable to perform this test, she is concerned with the cost and she was just released from the hospital on 9.5.15  Advised pt will cancel the PFT appt and notify MR (done).  Pt was encouraged to keep the ov w/ MR Will forward to MR as FYI.

## 2014-07-08 NOTE — Progress Notes (Signed)
Quick Note:  Called spoke with patient, advised of lab results / recs as stated by TP. Pt verbalized her understanding and denied any questions. ______ 

## 2014-07-11 NOTE — Discharge Summary (Signed)
INTERNAL MEDICINE ATTENDING DISCHARGE COSIGN   I evaluated the patient on the day of discharge and discussed the discharge plan with my resident team. I agree with the discharge documentation and disposition.   Elizabeth [name] 07/11/2014, 12:44 PM

## 2014-07-20 ENCOUNTER — Emergency Department (HOSPITAL_COMMUNITY): Payer: BC Managed Care – PPO

## 2014-07-20 ENCOUNTER — Inpatient Hospital Stay (HOSPITAL_COMMUNITY): Payer: BC Managed Care – PPO

## 2014-07-20 ENCOUNTER — Encounter (HOSPITAL_COMMUNITY): Payer: Self-pay | Admitting: Emergency Medicine

## 2014-07-20 ENCOUNTER — Inpatient Hospital Stay (HOSPITAL_COMMUNITY)
Admission: EM | Admit: 2014-07-20 | Discharge: 2014-08-18 | DRG: 004 | Disposition: A | Discharge status: Other Acute Inpatient Hospital | Payer: BC Managed Care – PPO | Attending: Internal Medicine | Admitting: Internal Medicine

## 2014-07-20 DIAGNOSIS — I469 Cardiac arrest, cause unspecified: Secondary | ICD-10-CM | POA: Diagnosis not present

## 2014-07-20 DIAGNOSIS — Z7189 Other specified counseling: Secondary | ICD-10-CM

## 2014-07-20 DIAGNOSIS — J939 Pneumothorax, unspecified: Secondary | ICD-10-CM

## 2014-07-20 DIAGNOSIS — J9621 Acute and chronic respiratory failure with hypoxia: Secondary | ICD-10-CM | POA: Diagnosis present

## 2014-07-20 DIAGNOSIS — R739 Hyperglycemia, unspecified: Secondary | ICD-10-CM | POA: Diagnosis not present

## 2014-07-20 DIAGNOSIS — K449 Diaphragmatic hernia without obstruction or gangrene: Secondary | ICD-10-CM

## 2014-07-20 DIAGNOSIS — I480 Paroxysmal atrial fibrillation: Secondary | ICD-10-CM | POA: Diagnosis not present

## 2014-07-20 DIAGNOSIS — I4891 Unspecified atrial fibrillation: Secondary | ICD-10-CM

## 2014-07-20 DIAGNOSIS — E46 Unspecified protein-calorie malnutrition: Secondary | ICD-10-CM | POA: Diagnosis present

## 2014-07-20 DIAGNOSIS — T17990A Other foreign object in respiratory tract, part unspecified in causing asphyxiation, initial encounter: Secondary | ICD-10-CM | POA: Diagnosis not present

## 2014-07-20 DIAGNOSIS — J9602 Acute respiratory failure with hypercapnia: Secondary | ICD-10-CM

## 2014-07-20 DIAGNOSIS — Z7982 Long term (current) use of aspirin: Secondary | ICD-10-CM | POA: Diagnosis not present

## 2014-07-20 DIAGNOSIS — R0602 Shortness of breath: Secondary | ICD-10-CM | POA: Diagnosis present

## 2014-07-20 DIAGNOSIS — Z978 Presence of other specified devices: Secondary | ICD-10-CM

## 2014-07-20 DIAGNOSIS — D638 Anemia in other chronic diseases classified elsewhere: Secondary | ICD-10-CM | POA: Diagnosis present

## 2014-07-20 DIAGNOSIS — K668 Other specified disorders of peritoneum: Secondary | ICD-10-CM | POA: Diagnosis not present

## 2014-07-20 DIAGNOSIS — Z885 Allergy status to narcotic agent status: Secondary | ICD-10-CM

## 2014-07-20 DIAGNOSIS — Z881 Allergy status to other antibiotic agents status: Secondary | ICD-10-CM | POA: Diagnosis not present

## 2014-07-20 DIAGNOSIS — J9601 Acute respiratory failure with hypoxia: Secondary | ICD-10-CM

## 2014-07-20 DIAGNOSIS — Z9889 Other specified postprocedural states: Secondary | ICD-10-CM

## 2014-07-20 DIAGNOSIS — I10 Essential (primary) hypertension: Secondary | ICD-10-CM | POA: Diagnosis present

## 2014-07-20 DIAGNOSIS — J982 Interstitial emphysema: Secondary | ICD-10-CM | POA: Diagnosis present

## 2014-07-20 DIAGNOSIS — I959 Hypotension, unspecified: Secondary | ICD-10-CM | POA: Diagnosis not present

## 2014-07-20 DIAGNOSIS — R079 Chest pain, unspecified: Secondary | ICD-10-CM

## 2014-07-20 DIAGNOSIS — Z6828 Body mass index (BMI) 28.0-28.9, adult: Secondary | ICD-10-CM

## 2014-07-20 DIAGNOSIS — Z87891 Personal history of nicotine dependence: Secondary | ICD-10-CM | POA: Diagnosis not present

## 2014-07-20 DIAGNOSIS — J9382 Other air leak: Secondary | ICD-10-CM | POA: Diagnosis not present

## 2014-07-20 DIAGNOSIS — R140 Abdominal distension (gaseous): Secondary | ICD-10-CM

## 2014-07-20 DIAGNOSIS — E871 Hypo-osmolality and hyponatremia: Secondary | ICD-10-CM | POA: Diagnosis not present

## 2014-07-20 DIAGNOSIS — F419 Anxiety disorder, unspecified: Secondary | ICD-10-CM | POA: Diagnosis present

## 2014-07-20 DIAGNOSIS — K219 Gastro-esophageal reflux disease without esophagitis: Secondary | ICD-10-CM | POA: Diagnosis present

## 2014-07-20 DIAGNOSIS — E2740 Unspecified adrenocortical insufficiency: Secondary | ICD-10-CM | POA: Diagnosis present

## 2014-07-20 DIAGNOSIS — Z9981 Dependence on supplemental oxygen: Secondary | ICD-10-CM | POA: Diagnosis not present

## 2014-07-20 DIAGNOSIS — J9383 Other pneumothorax: Secondary | ICD-10-CM | POA: Diagnosis present

## 2014-07-20 DIAGNOSIS — E870 Hyperosmolality and hypernatremia: Secondary | ICD-10-CM | POA: Diagnosis not present

## 2014-07-20 DIAGNOSIS — R1310 Dysphagia, unspecified: Secondary | ICD-10-CM

## 2014-07-20 DIAGNOSIS — E8770 Fluid overload, unspecified: Secondary | ICD-10-CM | POA: Diagnosis not present

## 2014-07-20 DIAGNOSIS — J9690 Respiratory failure, unspecified, unspecified whether with hypoxia or hypercapnia: Secondary | ICD-10-CM

## 2014-07-20 DIAGNOSIS — R531 Weakness: Secondary | ICD-10-CM

## 2014-07-20 DIAGNOSIS — K567 Ileus, unspecified: Secondary | ICD-10-CM

## 2014-07-20 DIAGNOSIS — J432 Centrilobular emphysema: Secondary | ICD-10-CM

## 2014-07-20 DIAGNOSIS — Z515 Encounter for palliative care: Secondary | ICD-10-CM

## 2014-07-20 DIAGNOSIS — E878 Other disorders of electrolyte and fluid balance, not elsewhere classified: Secondary | ICD-10-CM | POA: Diagnosis not present

## 2014-07-20 DIAGNOSIS — J449 Chronic obstructive pulmonary disease, unspecified: Secondary | ICD-10-CM

## 2014-07-20 DIAGNOSIS — J9622 Acute and chronic respiratory failure with hypercapnia: Secondary | ICD-10-CM | POA: Diagnosis present

## 2014-07-20 DIAGNOSIS — J9610 Chronic respiratory failure, unspecified whether with hypoxia or hypercapnia: Secondary | ICD-10-CM | POA: Diagnosis not present

## 2014-07-20 DIAGNOSIS — J441 Chronic obstructive pulmonary disease with (acute) exacerbation: Principal | ICD-10-CM

## 2014-07-20 DIAGNOSIS — J9600 Acute respiratory failure, unspecified whether with hypoxia or hypercapnia: Secondary | ICD-10-CM

## 2014-07-20 DIAGNOSIS — E43 Unspecified severe protein-calorie malnutrition: Secondary | ICD-10-CM

## 2014-07-20 DIAGNOSIS — E785 Hyperlipidemia, unspecified: Secondary | ICD-10-CM | POA: Diagnosis present

## 2014-07-20 DIAGNOSIS — J90 Pleural effusion, not elsewhere classified: Secondary | ICD-10-CM | POA: Diagnosis not present

## 2014-07-20 DIAGNOSIS — Z9689 Presence of other specified functional implants: Secondary | ICD-10-CM

## 2014-07-20 HISTORY — DX: Respiratory disorder, unspecified: J98.9

## 2014-07-20 HISTORY — DX: Dependence on respirator (ventilator) status: Z99.11

## 2014-07-20 LAB — CBC WITH DIFFERENTIAL/PLATELET
BASOS ABS: 0 10*3/uL (ref 0.0–0.1)
Basophils Relative: 0 % (ref 0–1)
EOS ABS: 0.1 10*3/uL (ref 0.0–0.7)
Eosinophils Relative: 1 % (ref 0–5)
HCT: 40.8 % (ref 36.0–46.0)
Hemoglobin: 11.4 g/dL — ABNORMAL LOW (ref 12.0–15.0)
LYMPHS PCT: 39 % (ref 12–46)
Lymphs Abs: 5.7 10*3/uL — ABNORMAL HIGH (ref 0.7–4.0)
MCH: 29.6 pg (ref 26.0–34.0)
MCHC: 27.9 g/dL — ABNORMAL LOW (ref 30.0–36.0)
MCV: 106 fL — ABNORMAL HIGH (ref 78.0–100.0)
MONO ABS: 1.2 10*3/uL — AB (ref 0.1–1.0)
Monocytes Relative: 8 % (ref 3–12)
NEUTROS ABS: 7.5 10*3/uL (ref 1.7–7.7)
Neutrophils Relative %: 52 % (ref 43–77)
Platelets: 398 10*3/uL (ref 150–400)
RBC: 3.85 MIL/uL — ABNORMAL LOW (ref 3.87–5.11)
RDW: 14.7 % (ref 11.5–15.5)
WBC: 14.5 10*3/uL — ABNORMAL HIGH (ref 4.0–10.5)

## 2014-07-20 LAB — URINE MICROSCOPIC-ADD ON

## 2014-07-20 LAB — I-STAT TROPONIN, ED: Troponin i, poc: 0.01 ng/mL (ref 0.00–0.08)

## 2014-07-20 LAB — COMPREHENSIVE METABOLIC PANEL
ALT: 18 U/L (ref 0–35)
ANION GAP: 6 (ref 5–15)
AST: 16 U/L (ref 0–37)
Albumin: 3.7 g/dL (ref 3.5–5.2)
Alkaline Phosphatase: 54 U/L (ref 39–117)
BUN: 11 mg/dL (ref 6–23)
CO2: 45 meq/L — AB (ref 19–32)
Calcium: 9.3 mg/dL (ref 8.4–10.5)
Chloride: 90 mEq/L — ABNORMAL LOW (ref 96–112)
Creatinine, Ser: 0.47 mg/dL — ABNORMAL LOW (ref 0.50–1.10)
GFR calc non Af Amer: >90 mL/min (ref >90)
GLUCOSE: 135 mg/dL — AB (ref 70–99)
POTASSIUM: 3.9 meq/L (ref 3.7–5.3)
Sodium: 141 mEq/L (ref 137–147)
Total Bilirubin: 0.6 mg/dL (ref 0.3–1.2)
Total Protein: 6.7 g/dL (ref 6.0–8.3)

## 2014-07-20 LAB — URINALYSIS, ROUTINE W REFLEX MICROSCOPIC
Bilirubin Urine: NEGATIVE
Glucose, UA: NEGATIVE mg/dL
Ketones, ur: NEGATIVE mg/dL
Leukocytes, UA: NEGATIVE
NITRITE: NEGATIVE
Protein, ur: 100 mg/dL — AB
SPECIFIC GRAVITY, URINE: 1.017 (ref 1.005–1.030)
Urobilinogen, UA: 0.2 mg/dL (ref 0.0–1.0)
pH: 5 (ref 5.0–8.0)

## 2014-07-20 LAB — TROPONIN I
Troponin I: <0.3 ng/mL (ref <0.30)
Troponin I: <0.3 ng/mL (ref <0.30)
Troponin I: <0.3 ng/mL (ref <0.30)

## 2014-07-20 LAB — GLUCOSE, CAPILLARY
GLUCOSE-CAPILLARY: 181 mg/dL — AB (ref 70–99)
GLUCOSE-CAPILLARY: 136 mg/dL — AB (ref 70–99)
Glucose-Capillary: 194 mg/dL — ABNORMAL HIGH (ref 70–99)
Glucose-Capillary: 212 mg/dL — ABNORMAL HIGH (ref 70–99)

## 2014-07-20 LAB — I-STAT CG4 LACTIC ACID, ED: Lactic Acid, Venous: 0.67 mmol/L (ref 0.5–2.2)

## 2014-07-20 LAB — PHOSPHORUS: Phosphorus: 1.5 mg/dL — ABNORMAL LOW (ref 2.3–4.6)

## 2014-07-20 LAB — PATHOLOGIST SMEAR REVIEW

## 2014-07-20 LAB — MRSA PCR SCREENING: MRSA BY PCR: NEGATIVE

## 2014-07-20 LAB — MAGNESIUM: MAGNESIUM: 2.2 mg/dL (ref 1.5–2.5)

## 2014-07-20 MED ORDER — PANTOPRAZOLE SODIUM 40 MG IV SOLR
40.0000 mg | INTRAVENOUS | Status: DC
  Administered 2014-07-20 – 2014-07-24 (×5): 40 mg via INTRAVENOUS
  Filled 2014-07-20 (×6): qty 40

## 2014-07-20 MED ORDER — CHLORHEXIDINE GLUCONATE 0.12 % MT SOLN
15.0000 mL | Freq: Two times a day (BID) | OROMUCOSAL | Status: DC
  Administered 2014-07-20 – 2014-08-18 (×59): 15 mL via OROMUCOSAL
  Filled 2014-07-20 (×60): qty 15

## 2014-07-20 MED ORDER — SODIUM CHLORIDE 0.9 % IV SOLN
INTRAVENOUS | Status: DC
  Administered 2014-07-21 – 2014-07-25 (×5): via INTRAVENOUS

## 2014-07-20 MED ORDER — SODIUM CHLORIDE 0.9 % IV SOLN
250.0000 mL | INTRAVENOUS | Status: DC | PRN
  Administered 2014-07-29: 250 mL via INTRAVENOUS
  Administered 2014-08-03: 500 mL via INTRAVENOUS

## 2014-07-20 MED ORDER — MAGNESIUM SULFATE 40 MG/ML IJ SOLN
2.0000 g | Freq: Once | INTRAMUSCULAR | Status: AC
  Administered 2014-07-20: 2 g via INTRAVENOUS

## 2014-07-20 MED ORDER — METHYLPREDNISOLONE SODIUM SUCC 125 MG IJ SOLR
INTRAMUSCULAR | Status: AC
  Administered 2014-07-20: 125 mg
  Filled 2014-07-20: qty 2

## 2014-07-20 MED ORDER — VITAL HIGH PROTEIN PO LIQD
1000.0000 mL | ORAL | Status: DC
  Filled 2014-07-20 (×2): qty 1000

## 2014-07-20 MED ORDER — ARFORMOTEROL TARTRATE 15 MCG/2ML IN NEBU
15.0000 ug | INHALATION_SOLUTION | Freq: Two times a day (BID) | RESPIRATORY_TRACT | Status: DC
Start: 2014-07-20 — End: 2014-08-08
  Administered 2014-07-20 – 2014-08-08 (×39): 15 ug via RESPIRATORY_TRACT
  Filled 2014-07-20 (×44): qty 2

## 2014-07-20 MED ORDER — SUCCINYLCHOLINE CHLORIDE 20 MG/ML IJ SOLN
INTRAMUSCULAR | Status: AC
  Administered 2014-07-20: 10 mg
  Filled 2014-07-20: qty 1

## 2014-07-20 MED ORDER — IPRATROPIUM BROMIDE 0.02 % IN SOLN
0.5000 mg | Freq: Once | RESPIRATORY_TRACT | Status: AC
  Administered 2014-07-20: 0.5 mg via RESPIRATORY_TRACT

## 2014-07-20 MED ORDER — IPRATROPIUM BROMIDE 0.02 % IN SOLN
RESPIRATORY_TRACT | Status: AC
  Administered 2014-07-20: 0.5 mg via RESPIRATORY_TRACT
  Filled 2014-07-20: qty 2.5

## 2014-07-20 MED ORDER — MIDAZOLAM HCL 2 MG/2ML IJ SOLN
INTRAMUSCULAR | Status: AC
  Filled 2014-07-20: qty 6

## 2014-07-20 MED ORDER — MAGNESIUM SULFATE 40 MG/ML IJ SOLN
INTRAMUSCULAR | Status: AC
  Filled 2014-07-20: qty 50

## 2014-07-20 MED ORDER — CETYLPYRIDINIUM CHLORIDE 0.05 % MT LIQD
7.0000 mL | Freq: Four times a day (QID) | OROMUCOSAL | Status: DC
  Administered 2014-07-20 – 2014-08-18 (×114): 7 mL via OROMUCOSAL

## 2014-07-20 MED ORDER — NOREPINEPHRINE BITARTRATE 1 MG/ML IV SOLN
2.0000 ug/min | Freq: Once | INTRAVENOUS | Status: AC
  Administered 2014-07-20: 5 ug/min via INTRAVENOUS
  Filled 2014-07-20: qty 4

## 2014-07-20 MED ORDER — FENTANYL CITRATE 0.05 MG/ML IJ SOLN: 50.0000 ug | Freq: Once | INTRAMUSCULAR | Status: DC

## 2014-07-20 MED ORDER — SODIUM CHLORIDE 0.9 % IV BOLUS (SEPSIS)
500.0000 mL | Freq: Once | INTRAVENOUS | Status: AC
  Administered 2014-07-20: 500 mL via INTRAVENOUS

## 2014-07-20 MED ORDER — HEPARIN SODIUM (PORCINE) 5000 UNIT/ML IJ SOLN
5000.0000 [IU] | Freq: Three times a day (TID) | INTRAMUSCULAR | Status: DC
  Administered 2014-07-20 – 2014-08-08 (×56): 5000 [IU] via SUBCUTANEOUS
  Filled 2014-07-20 (×64): qty 1

## 2014-07-20 MED ORDER — METHYLPREDNISOLONE SODIUM SUCC 125 MG IJ SOLR
125.0000 mg | Freq: Three times a day (TID) | INTRAMUSCULAR | Status: DC
  Administered 2014-07-20 – 2014-07-21 (×4): 125 mg via INTRAVENOUS
  Filled 2014-07-20 (×6): qty 2

## 2014-07-20 MED ORDER — FENTANYL BOLUS VIA INFUSION
50.0000 ug | INTRAVENOUS | Status: DC | PRN
  Administered 2014-07-25 – 2014-07-29 (×4): 100 ug via INTRAVENOUS
  Administered 2014-07-30: 50 ug via INTRAVENOUS
  Administered 2014-08-02: 100 ug via INTRAVENOUS
  Filled 2014-07-20: qty 100

## 2014-07-20 MED ORDER — LIDOCAINE HCL (CARDIAC) 20 MG/ML IV SOLN
INTRAVENOUS | Status: AC
  Filled 2014-07-20: qty 5

## 2014-07-20 MED ORDER — IPRATROPIUM-ALBUTEROL 0.5-2.5 (3) MG/3ML IN SOLN
3.0000 mL | RESPIRATORY_TRACT | Status: DC
  Administered 2014-07-20 – 2014-08-06 (×97): 3 mL via RESPIRATORY_TRACT
  Filled 2014-07-20 (×92): qty 3
  Filled 2014-07-20: qty 30
  Filled 2014-07-20 (×5): qty 3

## 2014-07-20 MED ORDER — NOREPINEPHRINE BITARTRATE 1 MG/ML IV SOLN
2.0000 ug/min | INTRAVENOUS | Status: DC
  Filled 2014-07-20: qty 4

## 2014-07-20 MED ORDER — MIDAZOLAM HCL 2 MG/2ML IJ SOLN: 5.0000 mg | Freq: Once | INTRAMUSCULAR | Status: DC

## 2014-07-20 MED ORDER — VITAL AF 1.2 CAL PO LIQD
1000.0000 mL | ORAL | Status: DC
  Administered 2014-07-20 – 2014-07-24 (×4): 1000 mL
  Filled 2014-07-20 (×10): qty 1000

## 2014-07-20 MED ORDER — LIDOCAINE HCL (PF) 1 % IJ SOLN
INTRAMUSCULAR | Status: AC
  Filled 2014-07-20: qty 5

## 2014-07-20 MED ORDER — SODIUM CHLORIDE 0.9 % IV SOLN
0.0000 ug/h | INTRAVENOUS | Status: DC
  Administered 2014-07-20: 100 ug/h via INTRAVENOUS
  Administered 2014-07-20: 200 ug/h via INTRAVENOUS
  Administered 2014-07-21 (×2): 300 ug/h via INTRAVENOUS
  Administered 2014-07-21: 200 ug/h via INTRAVENOUS
  Administered 2014-07-22 – 2014-07-23 (×2): 100 ug/h via INTRAVENOUS
  Administered 2014-07-24: 120 ug/h via INTRAVENOUS
  Administered 2014-07-24 – 2014-07-26 (×3): 150 ug/h via INTRAVENOUS
  Administered 2014-07-27: 80 ug/h via INTRAVENOUS
  Administered 2014-07-28: 100 ug/h via INTRAVENOUS
  Administered 2014-07-28: 250 ug/h via INTRAVENOUS
  Administered 2014-07-29 – 2014-07-30 (×3): 150 ug/h via INTRAVENOUS
  Administered 2014-07-31 – 2014-08-02 (×5): 250 ug/h via INTRAVENOUS
  Administered 2014-08-02: 150 ug/h via INTRAVENOUS
  Administered 2014-08-03: 100 ug/h via INTRAVENOUS
  Administered 2014-08-03: 150 ug/h via INTRAVENOUS
  Administered 2014-08-04: 100 ug/h via INTRAVENOUS
  Filled 2014-07-20 (×28): qty 50

## 2014-07-20 MED ORDER — LEVOFLOXACIN IN D5W 750 MG/150ML IV SOLN
750.0000 mg | Freq: Every day | INTRAVENOUS | Status: DC
  Administered 2014-07-20 – 2014-07-24 (×5): 750 mg via INTRAVENOUS
  Filled 2014-07-20 (×8): qty 150

## 2014-07-20 MED ORDER — INSULIN ASPART 100 UNIT/ML ~~LOC~~ SOLN
2.0000 [IU] | SUBCUTANEOUS | Status: DC
Start: 2014-07-20 — End: 2014-08-03
  Administered 2014-07-20 – 2014-07-23 (×16): 2 [IU] via SUBCUTANEOUS
  Administered 2014-07-23: 12:00:00 via SUBCUTANEOUS
  Administered 2014-07-23 – 2014-08-03 (×58): 2 [IU] via SUBCUTANEOUS

## 2014-07-20 MED ORDER — MIDAZOLAM HCL 2 MG/2ML IJ SOLN
2.0000 mg | INTRAMUSCULAR | Status: DC | PRN
  Administered 2014-07-20 – 2014-07-31 (×11): 2 mg via INTRAVENOUS
  Filled 2014-07-20 (×11): qty 2

## 2014-07-20 MED ORDER — PRO-STAT SUGAR FREE PO LIQD
30.0000 mL | Freq: Two times a day (BID) | ORAL | Status: AC
  Administered 2014-07-20 (×2): 30 mL
  Filled 2014-07-20 (×2): qty 30

## 2014-07-20 MED ORDER — ALBUTEROL (5 MG/ML) CONTINUOUS INHALATION SOLN
10.0000 mg/h | INHALATION_SOLUTION | Freq: Once | RESPIRATORY_TRACT | Status: AC
  Administered 2014-07-20: 10 mg/h via RESPIRATORY_TRACT

## 2014-07-20 MED ORDER — ROCURONIUM BROMIDE 50 MG/5ML IV SOLN
INTRAVENOUS | Status: AC
  Filled 2014-07-20: qty 2

## 2014-07-20 MED ORDER — ETOMIDATE 2 MG/ML IV SOLN
INTRAVENOUS | Status: AC
  Administered 2014-07-20: 20 mg
  Filled 2014-07-20: qty 20

## 2014-07-20 MED ORDER — BUDESONIDE 0.5 MG/2ML IN SUSP
0.5000 mg | Freq: Two times a day (BID) | RESPIRATORY_TRACT | Status: DC
  Administered 2014-07-20 – 2014-08-18 (×59): 0.5 mg via RESPIRATORY_TRACT
  Filled 2014-07-20 (×6): qty 2
  Filled 2014-07-20: qty 4
  Filled 2014-07-20 (×3): qty 2
  Filled 2014-07-20: qty 4
  Filled 2014-07-20 (×31): qty 2
  Filled 2014-07-20 (×2): qty 4
  Filled 2014-07-20 (×19): qty 2

## 2014-07-20 NOTE — Progress Notes (Signed)
RT spoke with CCM again, changed vent settings and placed back on the vent to transport to unit.

## 2014-07-20 NOTE — Progress Notes (Signed)
Pt placed on vent settings per CCM orders. RT heard vent alarming. Upon returning to pt room, Md had pt hooked to ambu bag and was told not to place on vent. CCM paged to clarify.

## 2014-07-20 NOTE — Progress Notes (Signed)
INITIAL NUTRITION ASSESSMENT  DOCUMENTATION CODES Per approved criteria  -Not Applicable   INTERVENTION:  Utilize 56M PEPuP Protocol: initiate TF via OGT with Vital AF 1.2 at 25 ml/h and Prostat 30 ml BID on day 1; on day 2, increase to goal rate of 50 ml/h (1200 ml per day) to provide 1440 kcals, 90 gm protein, 973 ml free water daily.  NUTRITION DIAGNOSIS: Inadequate oral intake related to inability to eat as evidenced by NPO status.   Goal: Intake to meet >90% of estimated nutrition needs.  Monitor:  TF tolerance/adequacy, weight trend, labs, vent status.  Reason for Assessment: MD Consult for TF initiation and management.  58 y.o. female  Admitting Dx: Respiratory failure due to COPD exacerbation  ASSESSMENT: 3rd admission in last 2 months for 58 year old with severe COPD on home oxygen but acute respiratory failure due to COPD exacerbation complicated by bilateral pneumothoraces and mechanical ventilation.  Nutrition Focused Physical Exam:  Subcutaneous Fat:  Orbital Region: WNL Upper Arm Region: WNL Thoracic and Lumbar Region: NA  Muscle:  Temple Region: WNL Clavicle Bone Region: WNL Clavicle and Acromion Bone Region: WNL Scapular Bone Region: NA Dorsal Hand: NA Patellar Region: mild-moderate depletion Anterior Thigh Region: WNL Posterior Calf Region: NA  Edema: none   Patient is currently intubated on ventilator support MV: 8.2 L/min Temp (24hrs), Avg:93.9 F (34.4 C), Min:92.1 F (33.4 C), Max:95.2 F (35.1 C)  Propofol: none  Height: Ht Readings from Last 1 Encounters:  07/20/14  (1.702 m)    Weight: Wt Readings from Last 1 Encounters:  07/20/14 134 lb 0.6 oz (60.8 kg)    Ideal Body Weight: 61.4 kg  % Ideal Body Weight: 99%  Wt Readings from Last 10 Encounters:  07/20/14 134 lb 0.6 oz (60.8 kg)  07/02/14 131 lb 9.8 oz (59.7 kg)  06/27/14 126 lb 12.8 oz (57.516 kg)  06/18/14 126 lb 15.8 oz (57.6 kg)  06/03/14 135 lb 12.8 oz  (61.598 kg)    Usual Body Weight: 135 lb  % Usual Body Weight: 99%  BMI:  Body mass index is 20.99 kg/(m^2).  Estimated Nutritional Needs: Kcal: 1400 Protein: 85-100 gm Fluid: 1.5 L  Skin: stage 1 pressure ulcer to sacrum   Diet Order: NPO  EDUCATION NEEDS: -Education not appropriate at this time   Intake/Output Summary (Last 24 hours) at 07/20/14 1152 Last data filed at 07/20/14 1000  Gross per 24 hour  Intake  48.48 ml  Output     75 ml  Net -26.52 ml    Last BM: PTA   Labs:   Recent Labs Lab 07/20/14 0554  NA 141  K 3.9  CL 90*  CO2 45*  BUN 11  CREATININE 0.47*  CALCIUM 9.3  GLUCOSE 135*    CBG (last 3)   Recent Labs  07/20/14 1039  GLUCAP 194*    Scheduled Meds: . antiseptic oral rinse  7 mL Mouth Rinse QID  . arformoterol  15 mcg Nebulization BID  . budesonide  0.5 mg Nebulization BID  . chlorhexidine  15 mL Mouth Rinse BID  . feeding supplement (PRO-STAT SUGAR FREE 64)  30 mL Per Tube BID  . feeding supplement (VITAL HIGH PROTEIN)  1,000 mL Per Tube Q24H  . fentaNYL  50 mcg Intravenous Once  . heparin  5,000 Units Subcutaneous 3 times per day  . insulin aspart  2 Units Subcutaneous 6 times per day  . ipratropium-albuterol  3 mL Nebulization Q4H  . levofloxacin (LEVAQUIN)  IV  750 mg Intravenous Daily  . lidocaine (cardiac) 100 mg/45ml      . magnesium sulfate      . methylPREDNISolone (SOLU-MEDROL) injection  125 mg Intravenous 3 times per day  . midazolam      . midazolam  5 mg Intravenous Once  . pantoprazole (PROTONIX) IV  40 mg Intravenous Q24H  . rocuronium        Continuous Infusions: . sodium chloride 75 mL/hr at 07/20/14 0715  . fentaNYL infusion INTRAVENOUS 150 mcg/hr (07/20/14 0935)    Past Medical History  Diagnosis Date  . COPD (chronic obstructive pulmonary disease)   . Hypertension   . Acute respiratory failure   . GERD (gastroesophageal reflux disease)   . Hiatal hernia   . Hyperlipidemia   . Shortness of  breath     Past Surgical History  Procedure Laterality Date  . Cholecystectomy      Joaquin Courts, RD, LDN, CNSC Pager [phone] After Hours Pager [phone]

## 2014-07-20 NOTE — H&P (Signed)
PULMONARY / CRITICAL CARE MEDICINE   Name: Elizabeth [name] MRN: [id number] DOB: [birth year]    ADMISSION DATE:  07/20/2014  REFERRING MD :  Ranae Palms, ED  CHIEF COMPLAINT:  resp distress  INITIAL PRESENTATION: 3rd  Admission in last 2 months for 58 year old with severe COPD on home oxygen but acute respiratory failure due to COPD exacerbation complicated by bilateral pneumothoraces and mechanical ventilation  STUDIES:   SIGNIFICANT EVENTS: 9/23 Bl chest tubes in ED   HISTORY OF PRESENT ILLNESS:  58 yo former smoker , quit 2013 , presumed very severe COPD w/ chronic hypercarbic/hypoxic RF on chronic O2.  Alpha 1- nml Seen for initial pulmonary consult during hospital admit 05/28/14  Followed by ramaswamy now Admit 05/22/14 AECOPD  Admit 05/28/14 AECOPD/VDRF  Admit 06/11/14 AECOPD /A on C RF  Adm 9/5-9/9 copd flare PCO2 ~90. Changed from symbicort to Brovana and Budesonide Neb , continued on Spiriva.  On home bipap Admitted this time with progressive respiratory distress, sat 64% on arrival on 3 L oxygen. Intubated in the emergency room, following this suddenly developed distention of her abdomen chest wall and neck. Pneumothorax suspected, left chest tube emergently placed, chest x-ray showed expansion of the left lung with a 40% pneumothorax on the right. Right chest tube was also placed.   PAST MEDICAL HISTORY :  Past Medical History  Diagnosis Date  . COPD (chronic obstructive pulmonary disease)   . Hypertension   . Acute respiratory failure   . GERD (gastroesophageal reflux disease)   . Hiatal hernia   . Hyperlipidemia   . Shortness of breath    Past Surgical History  Procedure Laterality Date  . Cholecystectomy     Prior to Admission medications   Medication Sig Start Date End Date Taking? Authorizing Provider  albuterol (PROAIR HFA) 108 (90 BASE) MCG/ACT inhaler Inhale 2 puffs into the lungs every 4 (four) hours as needed for wheezing or shortness of breath.    Historical  Provider, MD  albuterol (PROVENTIL) (2.5 MG/3ML) 0.083% nebulizer solution Take 3 mLs (2.5 mg total) by nebulization every 4 (four) hours as needed for shortness of breath. 06/18/14   Bernadene Person, NP  alendronate (FOSAMAX) 70 MG tablet Take 70 mg by mouth every Wednesday. Take with a full glass of water on an empty stomach.    Historical Provider, MD  arformoterol (BROVANA) 15 MCG/2ML NEBU Take 2 mLs (15 mcg total) by nebulization 2 (two) times daily. 06/18/14   Bernadene Person, NP  aspirin 81 MG tablet Take 81 mg by mouth daily.    Historical Provider, MD  budesonide (PULMICORT) 0.5 MG/2ML nebulizer solution Take 2 mLs (0.5 mg total) by nebulization 2 (two) times daily. 06/18/14   Bernadene Person, NP  feeding supplement, ENSURE COMPLETE, (ENSURE COMPLETE) LIQD Take 237 mLs by mouth 2 (two) times daily between meals. 06/03/14   Kathlen Mody, MD  LORazepam (ATIVAN) 1 MG tablet Take 0.5 tablets (0.5 mg total) by mouth 2 (two) times daily as needed for anxiety. 06/16/14   Bernadene Person, NP  metoprolol tartrate (LOPRESSOR) 25 MG tablet Take 12.5 mg by mouth 2 (two) times daily.     Historical Provider, MD  pantoprazole (PROTONIX) 40 MG tablet Take 1 tablet (40 mg total) by mouth 2 (two) times daily before a meal. 06/27/14   Tammy S Parrett, NP  predniSONE (DELTASONE) 10 MG tablet Take 4 tabs daily for 3 days, 3 tabs daily for 3 days, 2 tabs daily for 3  days, and 1 tab daily until follow up appointment with pulmonologist. 07/05/14   Rich Number, MD  simvastatin (ZOCOR) 20 MG tablet Take 20 mg by mouth daily at 6 PM.    Historical Provider, MD  tiotropium (SPIRIVA) 18 MCG inhalation capsule Place 18 mcg into inhaler and inhale daily.    Historical Provider, MD  valACYclovir (VALTREX) 500 MG tablet Take 500 mg by mouth every morning.     Historical Provider, MD   Allergies  Allergen Reactions  . Bactrim Ds [Sulfamethoxazole-Trimethoprim] Hives  . Codeine Nausea And Vomiting  . Morphine  Nausea And Vomiting  . Demerol [Meperidine] Other (See Comments)    FAMILY HISTORY:  No family history on file. SOCIAL HISTORY:  reports that she quit smoking about 10 years ago. Her smoking use included Cigarettes. She has a 30 pack-year smoking history. She has never used smokeless tobacco. She reports that she does not drink alcohol or use illicit drugs.  REVIEW OF SYSTEMS:  Unable to obtain  SUBJECTIVE:   VITAL SIGNS: Pulse Rate:  [55-116] 106 (09/23 0730) Resp:  [10-30] 30 (09/23 0700) BP: (67-192)/(33-114) 124/76 mmHg (09/23 0730) SpO2:  [94 %-100 %] 99 % (09/23 0730) FiO2 (%):  [40 %-100 %] 40 % (09/23 0749) HEMODYNAMICS:   VENTILATOR SETTINGS: Vent Mode:  [-] PRVC FiO2 (%):  [40 %-100 %] 40 % Set Rate:  [15 bmp-18 bmp] 15 bmp Vt Set:  [490 mL-500 mL] 490 mL PEEP:  [5 cmH20] 5 cmH20 Plateau Pressure:  [21 cmH20] 21 cmH20 INTAKE / OUTPUT:  Intake/Output Summary (Last 24 hours) at 07/20/14 0753 Last data filed at 07/20/14 0729  Gross per 24 hour  Intake      0 ml  Output      0 ml  Net      0 ml    PHYSICAL EXAMINATION: Gen. thin, well-nourished, in no distress,sedated , oral ETT ENT - no lesions, no post nasal drip Neck: No JVD, no thyromegaly, no carotid bruits, sub cut crepitis over neck, chest wall & abdomen Lungs: no use of accessory muscles, no dullness to percussion, decreased BL without rales or rhonchi , good variation in water seal with breath Cardiovascular: Rhythm regular, heart sounds  normal, no murmurs, no peripheral edema Abdomen: soft and non-tender, no hepatosplenomegaly, BS normal. Musculoskeletal: No deformities, no cyanosis or clubbing Neuro:  Sedated, non focal prior per ED Skin:  Warm, no lesions/ rash   LABS:  CBC  Recent Labs Lab 07/20/14 0554  WBC 14.5*  HGB 11.4*  HCT 40.8  PLT 398   Coag's No results found for this basename: APTT, INR,  in the last 168 hours BMET  Recent Labs Lab 07/20/14 0554  NA 141  K 3.9  CL  90*  CO2 45*  BUN 11  CREATININE 0.47*  GLUCOSE 135*   Electrolytes  Recent Labs Lab 07/20/14 0554  CALCIUM 9.3   Sepsis Markers  Recent Labs Lab 07/20/14 0557  LATICACIDVEN 0.67   ABG No results found for this basename: PHART, PCO2ART, PO2ART,  in the last 168 hours Liver Enzymes  Recent Labs Lab 07/20/14 0554  AST 16  ALT 18  ALKPHOS 54  BILITOT 0.6  ALBUMIN 3.7   Cardiac Enzymes  Recent Labs Lab 07/20/14 0530  TROPONINI <0.30   Glucose No results found for this basename: GLUCAP,  in the last 168 hours  Imaging No results found.   ASSESSMENT / PLAN:  PULMONARY OETT9/23  A: Acute respiratory failure with  hypercarbia Severe COPD on home oxygen -bullous emphysema, ? End stage Bilateral pneumothorax P:   Bilateral Chest tubes to suction -left chest tube may have to be pulled back if air leak  absent Serial chest x-ray, monitor subcutaneous emphysema clinically Keep respiratory rate 15-18 range and avoid auto PEEP Solu-Medrol 125 every 8 Duonebs q 4h Pulmicort and brovana  CARDIOVASCULAR  A: Hypertension Poor EKG complexes on monitor due to subcutaneous emphysema P:  Treat pain  RENAL A:  No issues P:   Hydrate  GASTROINTESTINAL A:  No issues P:   Start tube feeds as soon as clinically stable  HEMATOLOGIC A:  No issues P:  Monitor WBC count  INFECTIOUS A:  No issues P:   Levaquin empirically  ENDOCRINE A:  Not a known diabetic   P:   SSI  NEUROLOGIC A:  Pain P:   RASS goal: -1 fent gtt, versed prn, use propofol if Bp permits  TODAY'S SUMMARY: Recurrent admissions, this episode complicated by mechanical ventilation and bilateral pneumothoraxes unfortunately, prognosis guarded. Discussed in detail with her husband and sister-who desires full resuscitation for now.  I have personally obtained a history, examined the patient, evaluated laboratory and imaging results, formulated the assessment and plan and placed  orders. CRITICAL CARE: The patient is critically ill with multiple organ systems failure and requires high complexity decision making for assessment and support, frequent evaluation and titration of therapies, application of advanced monitoring technologies and extensive interpretation of multiple databases. Critical Care Time devoted to patient care services described in this note is 60 minutes.   Cyril Mourning MD. Tonny Bollman. [facility] Pager [phone] If no response call [phone]   07/20/2014, 7:53 AM

## 2014-07-20 NOTE — Progress Notes (Signed)
Pt bagged to unit with vent at bedside.

## 2014-07-20 NOTE — ED Notes (Signed)
Pt. reports progressing SOB onset yesterday , denies cough or congestion , no fever or chills , O2 sat= 64 % at arrival on 3 lpm/[place].

## 2014-07-20 NOTE — ED Notes (Signed)
Dr. Kathrin Penner updated on pt. Status prior to transport.

## 2014-07-20 NOTE — Progress Notes (Signed)
Responded to page from ed that family was in need of support. Waited with family for information regarding patient. Family, especially husband, were shocked and distressed by severity of patient's condition. Spent time with family offering emotional support and caring presence until pt was transferred to 25m at 8:30 am. Let them know chaplains are available should need arise.   07/20/14 0830  Clinical Encounter Type  Visited With Family  Visit Type Spiritual support  Referral From Nurse  Consult/Referral To Chaplain  Spiritual Encounters  Spiritual Needs Emotional  Stress Factors  Family Stress Factors Major life changes;Health changes;Exhausted   Wille Glaser 07/20/2014 9:18 AM

## 2014-07-20 NOTE — Progress Notes (Signed)
ABG received at this time with critical values that will not cross over in [place]. MD made aware. RT will continue to assist.

## 2014-07-20 NOTE — Progress Notes (Signed)
I was called to patients room at this time to set patient up on BIPAP. Patient was placed on BIPAP 12/6 and 100% initially. Increased to 20/10 and 100% due to patients non effort and low minute ventilation. After a few minutes of patient not improving, MD decided to intubate. Patient was intubated at approximintly 6 AM. Patient remained on vent for no more than 10 minutes when her HR decreased. MD did a few compressions and I took patient off vent to bag. Also tried to give CAT at this time but was stopped short. Patient through a spontaneous pneumo. I bagged patient while MD inserted chest tubes. RT Tresa Endo came to help and took over bagging until MD is finished. Patient is currently still being bagged waiting until stable to go back on vent. RT will continue to assist as needed.

## 2014-07-20 NOTE — ED Notes (Addendum)
Pt unable to maintain O2 sats, pt needing constant verbal or tactile stimulation to take in a breath. EDP [place] gave verbal orders for RSI kit for intubation purpose. Bilateral breath sounds diminished in all fields, pt unable to move air on her own.

## 2014-07-20 NOTE — Procedures (Signed)
Central Venous Catheter Insertion Procedure Note Elizabeth [name] [id number] 1956/04/21  Procedure: Insertion of Central Venous Catheter Indications: Assessment of intravascular volume, Drug and/or fluid administration and Frequent blood sampling  Procedure Details Consent: Risks of procedure as well as the alternatives and risks of each were explained to the (patient/caregiver).  Consent for procedure obtained. Time Out: Verified patient identification, verified procedure, site/side was marked, verified correct patient position, special equipment/implants available, medications/allergies/relevent history reviewed, required imaging and test results available.  Performed  Maximum sterile technique was used including antiseptics, cap, gloves, gown, hand hygiene, mask and sheet. Skin prep: Chlorhexidine; local anesthetic administered A antimicrobial bonded/coated triple lumen catheter was placed in the left internal jugular vein using the Seldinger technique.  Evaluation Blood flow good Complications: No apparent complications Patient did tolerate procedure well. Chest X-ray ordered to verify placement.  CXR: pending.  Procedure performed under direct ultrasound guidance for real time vessel cannulation.     Josefa Half 07/20/2014, 11:37 AM   Rutherford Guys, PA - [facility] Pgr: [phone]  or [phone]   Attending:  I was present for and supervised the procedure Heber Cantrall, MD [facility] Pager: [phone] Cell: [phone] If no response, call [phone]

## 2014-07-20 NOTE — ED Notes (Signed)
MD Vassie Loll at bedside.

## 2014-07-20 NOTE — ED Provider Notes (Signed)
CSN: [id number]     Arrival date & time 07/20/14  0516 History   First MD Initiated Contact with Patient 07/20/14 [phone]     Chief Complaint  Patient presents with  . Shortness of Breath     (Consider location/radiation/quality/duration/timing/severity/associated sxs/prior Treatment) HPI Patient with history of severe COPD on 2 L of oxygen presents from by family for increased shortness of breath and decreased mentation. Patient so that they will answer questions at this time. Level V caveat applies. Recently admitted for COPD exacerbation. Past Medical History  Diagnosis Date  . COPD (chronic obstructive pulmonary disease)   . Hypertension   . Acute respiratory failure   . GERD (gastroesophageal reflux disease)   . Hiatal hernia   . Hyperlipidemia   . Shortness of breath    Past Surgical History  Procedure Laterality Date  . Cholecystectomy     No family history on file. History  Substance Use Topics  . Smoking status: Former Smoker -- 1.00 packs/day for 30 years    Types: Cigarettes    Quit date: 05/30/2004  . Smokeless tobacco: Never Used  . Alcohol Use: No   OB History   Grav Para Term Preterm Abortions TAB SAB Ect Mult Living                 Review of Systems  Unable to perform ROS     Allergies  Bactrim ds; Codeine; Morphine; and Demerol  Home Medications   Prior to Admission medications   Medication Sig Start Date End Date Taking? Authorizing Provider  albuterol (PROAIR HFA) 108 (90 BASE) MCG/ACT inhaler Inhale 2 puffs into the lungs every 4 (four) hours as needed for wheezing or shortness of breath.    Historical Provider, MD  albuterol (PROVENTIL) (2.5 MG/3ML) 0.083% nebulizer solution Take 3 mLs (2.5 mg total) by nebulization every 4 (four) hours as needed for shortness of breath. 06/18/14   Bernadene Person, NP  alendronate (FOSAMAX) 70 MG tablet Take 70 mg by mouth every Wednesday. Take with a full glass of water on an empty stomach.    Historical  Provider, MD  arformoterol (BROVANA) 15 MCG/2ML NEBU Take 2 mLs (15 mcg total) by nebulization 2 (two) times daily. 06/18/14   Bernadene Person, NP  aspirin 81 MG tablet Take 81 mg by mouth daily.    Historical Provider, MD  budesonide (PULMICORT) 0.5 MG/2ML nebulizer solution Take 2 mLs (0.5 mg total) by nebulization 2 (two) times daily. 06/18/14   Bernadene Person, NP  feeding supplement, ENSURE COMPLETE, (ENSURE COMPLETE) LIQD Take 237 mLs by mouth 2 (two) times daily between meals. 06/03/14   Kathlen Mody, MD  LORazepam (ATIVAN) 1 MG tablet Take 0.5 tablets (0.5 mg total) by mouth 2 (two) times daily as needed for anxiety. 06/16/14   Bernadene Person, NP  metoprolol tartrate (LOPRESSOR) 25 MG tablet Take 12.5 mg by mouth 2 (two) times daily.     Historical Provider, MD  pantoprazole (PROTONIX) 40 MG tablet Take 1 tablet (40 mg total) by mouth 2 (two) times daily before a meal. 06/27/14   Tammy S Parrett, NP  predniSONE (DELTASONE) 10 MG tablet Take 4 tabs daily for 3 days, 3 tabs daily for 3 days, 2 tabs daily for 3 days, and 1 tab daily until follow up appointment with pulmonologist. 07/05/14   Rich Number, MD  simvastatin (ZOCOR) 20 MG tablet Take 20 mg by mouth daily at 6 PM.    Historical Provider, MD  tiotropium (SPIRIVA) 18 MCG inhalation capsule Place 18 mcg into inhaler and inhale daily.    Historical Provider, MD  valACYclovir (VALTREX) 500 MG tablet Take 500 mg by mouth every morning.     Historical Provider, MD   BP 124/76  Pulse 106  Resp 30  SpO2 99% Physical Exam  Nursing note and vitals reviewed. Constitutional: She appears well-developed and well-nourished. No distress.  Rash appearing, lethargic  HENT:  Head: Normocephalic and atraumatic.  Mouth/Throat: Oropharynx is clear and moist.  Eyes: EOM are normal. Pupils are equal, round, and reactive to light.  Neck: Normal range of motion. Neck supple.  Cardiovascular: Normal rate and regular rhythm.  Exam reveals no  gallop and no friction rub.   No murmur heard. Pulmonary/Chest:  Hyperexpanded chest. Decreased breath sounds throughout. Slow respirations  Abdominal: Soft. Bowel sounds are normal. She exhibits distension (mild abdominal distention). She exhibits no mass. There is no tenderness. There is no rebound and no guarding.  Musculoskeletal: Normal range of motion. She exhibits no edema and no tenderness.  Lower extremity swelling or pain.  Neurological:  Will open eyes to voice. Speaks in few word sentences  Skin: Skin is dry. No rash noted. No erythema.  Cool skin    ED Course  INTUBATION Date/Time: 07/20/2014 7:46 AM Performed by: Loren Racer Authorized by: Ranae Palms, Kacee Koren Consent: The procedure was performed in an emergent situation. Intubation method: video-assisted Preoxygenation: nonrebreather mask Sedatives: etomidate Paralytic: succinylcholine Laryngoscope size: Mac 3 Tube size: 8.0 mm Tube type: cuffed Number of attempts: 1 Cricoid pressure: yes Cords visualized: yes Post-procedure assessment: chest rise and CO2 detector Breath sounds: equal Cuff inflated: yes Tube secured with: ETT holder Chest x-ray interpreted by me and radiologist. Chest x-ray findings: endotracheal tube in appropriate position Patient tolerance: Patient tolerated the procedure well with no immediate complications.  CHEST TUBE INSERTION Date/Time: 07/20/2014 7:48 AM Performed by: Loren Racer Authorized by: Ranae Palms, Kiree Dejarnette Consent: The procedure was performed in an emergent situation. Indications: pneumothorax Patient sedated: no Preparation: skin prepped with Betadine Placement location: right lateral Scalpel size: 11 Tube size: 20 [place] Dissection instrument: Kelly clamp Ultrasound guidance: no Tension pneumothorax heard: no Tube connected to: suction Drainage amount: 0 ml Suture material: 2-0 silk Dressing: 4x4 sterile gauze and Xeroform gauze Post-insertion x-ray findings:  tube in good position Patient tolerance: Patient tolerated the procedure well with no immediate complications.  CHEST TUBE INSERTION Date/Time: 07/20/2014 7:57 AM Performed by: Loren Racer Authorized by: Ranae Palms, Emeri Estill Consent: The procedure was performed in an emergent situation. Indications: pneumothorax Patient sedated: no Preparation: skin prepped with Betadine Placement location: left lateral Scalpel size: 11 Tube size: 20 [place] Dissection instrument: finger and Kelly clamp Ultrasound guidance: no Tension pneumothorax heard: no Tube connected to: suction Drainage amount: 0 ml Suture material: 2-0 silk Dressing: 4x4 sterile gauze and Xeroform gauze Post-insertion x-ray findings: tube in good position Patient tolerance: Patient tolerated the procedure well with no immediate complications.   (including critical care time) Labs Review Labs Reviewed  COMPREHENSIVE METABOLIC PANEL - Abnormal; Notable for the following:    Chloride 90 (*)    CO2 45 (*)    Glucose, Bld 135 (*)    Creatinine, Ser 0.47 (*)    All other components within normal limits  CBC WITH DIFFERENTIAL - Abnormal; Notable for the following:    WBC 14.5 (*)    RBC 3.85 (*)    Hemoglobin 11.4 (*)    MCV 106.0 (*)    MCHC  27.9 (*)    Lymphs Abs 5.7 (*)    Monocytes Absolute 1.2 (*)    All other components within normal limits  TROPONIN I  BLOOD GAS, ARTERIAL  CBC  CREATININE, SERUM  BLOOD GAS, ARTERIAL  TROPONIN I  TROPONIN I  TROPONIN I  I-STAT CHEM 8, ED  I-STAT CG4 LACTIC ACID, ED  I-STAT TROPOININ, ED    Imaging Review Dg Chest Portable 1 View  07/20/2014   CLINICAL DATA:  Chest tube insertion for pneumothorax  EXAM: PORTABLE CHEST - 1 VIEW  COMPARISON:  Study obtained earlier in the day  FINDINGS: There is a new chest tube on the right with the tip at the right apex. There has been interval resolution of a pneumothorax on the right. No pneumothorax is currently appreciable.   Endotracheal tube tip is 4.1 cm above the carina. There is a chest tube on the left. Nasogastric tube tip and side port are below the diaphragm.  Widespread subcutaneous emphysema is again noted. There is mild interstitial edema in the lungs. No consolidation. Heart size and pulmonary vascularity are normal. No adenopathy.  IMPRESSION: Interval resolution of pneumothorax the right following chest tube placement. There is mild interstitial edema in the lungs, stable. No new opacity. Extensive subcutaneous emphysema remains.   Electronically Signed   By: Bretta Bang M.D.   On: 07/20/2014 07:48   Dg Chest Portable 1 View  07/20/2014   CLINICAL DATA:  Shortness of Breath  EXAM: PORTABLE CHEST - 1 VIEW  COMPARISON:  07/20/2014  FINDINGS: Cardiomediastinal silhouette is stable. There is a left chest tube. Endotracheal tube and NG tube in place. Bilateral chest wall subcutaneous emphysema. There is about 40% new right pneumothorax and partially collapsed right lung.  IMPRESSION: Left chest tube in place. No left pneumothorax. Bilateral chest wall subcutaneous emphysema. New about 40% right pneumothorax. Critical Value/emergent results were called by telephone at the time of interpretation on 07/20/2014 at 7:19 am to Dr. Loren Racer , who verbally acknowledged these results.   Electronically Signed   By: Natasha Mead M.D.   On: 07/20/2014 07:19   Dg Chest Portable 1 View  07/20/2014   CLINICAL DATA:  Shortness of breath  EXAM: PORTABLE CHEST - 1 VIEW  COMPARISON:  Prior radiograph from 07/02/2014  FINDINGS: The cardiac and mediastinal silhouettes are stable in size and contour, and remain within normal limits.  The lungs are hyperinflated with emphysematous changes present. No airspace consolidation or pulmonary edema. Minimal chronic blunting of the bilateral costophrenic angles is stable. No pleural effusion or pneumothorax.  No acute osseous abnormality identified.  IMPRESSION: Emphysema.  No active  cardiopulmonary disease.   Electronically Signed   By: Rise Mu M.D.   On: 07/20/2014 06:33   Dg Chest Portable 1 View  07/20/2014   CLINICAL DATA:  Shortness of breath  EXAM: PORTABLE CHEST - 1 VIEW  COMPARISON:  Chest x-ray from the same day at 5:29.  FINDINGS: Tracheal intubation, tip between the clavicular heads and carina. An orogastric tube at least reaches the diaphragm.  Normal heart size and mediastinal contours.  Severe pulmonary hyperinflation, with nonvisualization of the left base. Bullous emphysema, especially the left base. No superimposed edema, effusion, pneumonia, or pneumothorax.  IMPRESSION: 1. New endotracheal and orogastric tubes are in good position. 2. Severe emphysema.  No acute superimposed findings.   Electronically Signed   By: Tiburcio Pea M.D.   On: 07/20/2014 06:34   Dg Chest Portable 1 View  07/20/2014   CLINICAL DATA:  Assess pneumothorax.  EXAM: PORTABLE CHEST - 1 VIEW  COMPARISON:  Chest x-ray from earlier the same day at 5:46 a.m.  FINDINGS: Endotracheal tube ends between the clavicular heads and carina. A gastric suction tube likely reaches the stomach, although is partially imaged.  Normal heart size.  Negative aortic contours.  Severe pulmonary hyperinflation with bullous change. No edema, pneumonia, effusion, or visible pneumothorax.  IMPRESSION: 1. Endotracheal tube in good position. 2. Orogastric tube side port may be at the GE junction. Consider abdominal imaging. 3. Bullous emphysema.  No acute superimposed findings.   Electronically Signed   By: Tiburcio Pea M.D.   On: 07/20/2014 06:23     EKG Interpretation None     CRITICAL CARE Performed by: Ranae Palms, Zniya Cottone Total critical care time: 90 min Critical care time was exclusive of separately billable procedures and treating other patients. Critical care was necessary to treat or prevent imminent or life-threatening deterioration. Critical care was time spent personally by me on the  following activities: development of treatment plan with patient and/or surrogate as well as nursing, discussions with consultants, evaluation of patient's response to treatment, examination of patient, obtaining history from patient or surrogate, ordering and performing treatments and interventions, ordering and review of laboratory studies, ordering and review of radiographic studies, pulse oximetry and re-evaluation of patient's condition. Care  MDM   Final diagnoses:  None   Initial O2 sats in the 40s. Responded well to BiPAP with saturations at 100%. However the patient's mental status continued to decline becoming arousable. Patient was intubated using a gluide scope. Bilateral breath sounds noted. Saturations remained 100%. Patient given prolonged period for expiration. Manual pressure was used to help aid in this process. At one point patient's blood pressure dropped into the 60s heart rate into the 40s. JVD was distended. Patient was on from back and given manual pressure in the chest. Improved heart rate and blood pressure. Solumedrol given. Mg given. Critical Care paged. Repeat chest x-ray showed ET tube in good position. Bilateral severe emphysema with large bulla especially in the left base. Density of patient's albuterol med through the ET tube. Patient became increasingly distended in the abdomen and soft tissues of the chest. Nebulizer was discontinued immediately. Decision made to place a chest tube in the left chest. Significant air rush was noted to. Repeat x-ray showed a 40% right pneumothorax. Reviewed with critical care doctor. Just to place in the right chest without complication. Patient's vital signs are all stable. satting 100%. Critical care M.D. discussed case in the family. Will be admitted to ICU bed.    Loren Racer, MD 07/20/14 [phone]

## 2014-07-20 NOTE — ED Notes (Signed)
Critical CO2 of 45 per Cresenciano Genre taken by RN Johny Drilling.  Communicated to Dr. Ranae Palms at bedside.

## 2014-07-20 NOTE — ED Notes (Signed)
Family at bedside updated on plan of care.

## 2014-07-20 NOTE — ED Notes (Signed)
XR at bedside

## 2014-07-20 NOTE — Progress Notes (Signed)
[facility]  Patient seen and examined Currently stable on vent, oxygenating well, poor air movement but this is not unexpected, levophed weaning Check abg , but TOLERATE HYPERCARBIA AND ACIDOSIS TO pH as low as 7.1, do not titrate vent up for hypercarbia Saline bolus x1  Heber Parachute, MD [facility] Pager: [phone] Cell: [phone] If no response, call [phone]

## 2014-07-20 NOTE — ED Notes (Signed)
Dr. Kathrin Penner wants pt. Up to 2100 stat. Pt. Not moving extremities or pulling at tubes.

## 2014-07-20 NOTE — ED Notes (Addendum)
Fentanyl hung on 2100 prior to moving pt. From stretcher to a 2 M bed. 50 mcg bolus and maintenance rate 50 mcg.  Tasks performed by Maryellen Pile, RN. Dr. Kathrin Penner made aware that sedation was not started per pt. Status and the immediacy to have pt. Transported to 2 M.

## 2014-07-21 ENCOUNTER — Inpatient Hospital Stay (HOSPITAL_COMMUNITY): Payer: BC Managed Care – PPO

## 2014-07-21 DIAGNOSIS — J9620 Acute and chronic respiratory failure, unspecified whether with hypoxia or hypercapnia: Secondary | ICD-10-CM

## 2014-07-21 DIAGNOSIS — E43 Unspecified severe protein-calorie malnutrition: Secondary | ICD-10-CM

## 2014-07-21 DIAGNOSIS — I10 Essential (primary) hypertension: Secondary | ICD-10-CM

## 2014-07-21 DIAGNOSIS — J449 Chronic obstructive pulmonary disease, unspecified: Secondary | ICD-10-CM

## 2014-07-21 LAB — PHOSPHORUS
Phosphorus: 2.1 mg/dL — ABNORMAL LOW (ref 2.3–4.6)
Phosphorus: 2.1 mg/dL — ABNORMAL LOW (ref 2.3–4.6)
Phosphorus: 2.4 mg/dL (ref 2.3–4.6)

## 2014-07-21 LAB — BASIC METABOLIC PANEL
Anion gap: 5 (ref 5–15)
BUN: 15 mg/dL (ref 6–23)
CO2: 38 mEq/L — ABNORMAL HIGH (ref 19–32)
CREATININE: 0.5 mg/dL (ref 0.50–1.10)
Calcium: 8.4 mg/dL (ref 8.4–10.5)
Chloride: 96 mEq/L (ref 96–112)
Glucose, Bld: 135 mg/dL — ABNORMAL HIGH (ref 70–99)
Potassium: 4.5 mEq/L (ref 3.7–5.3)
Sodium: 139 mEq/L (ref 137–147)

## 2014-07-21 LAB — BLOOD GAS, ARTERIAL
Acid-Base Excess: 11.6 mmol/L — ABNORMAL HIGH (ref 0.0–2.0)
BICARBONATE: 37.2 meq/L — AB (ref 20.0–24.0)
Drawn by: 36496
FIO2: 0.6 %
MECHVT: 490 mL
O2 Saturation: 98.9 %
PEEP/CPAP: 0 cmH2O
PH ART: 7.364 (ref 7.350–7.450)
PO2 ART: 145 mmHg — AB (ref 80.0–100.0)
Patient temperature: 99.8
RATE: 12 resp/min
TCO2: 39.3 mmol/L (ref 0–100)
pCO2 arterial: 67.6 mmHg (ref 35.0–45.0)

## 2014-07-21 LAB — GLUCOSE, CAPILLARY
GLUCOSE-CAPILLARY: 145 mg/dL — AB (ref 70–99)
GLUCOSE-CAPILLARY: 146 mg/dL — AB (ref 70–99)
GLUCOSE-CAPILLARY: 147 mg/dL — AB (ref 70–99)
Glucose-Capillary: 149 mg/dL — ABNORMAL HIGH (ref 70–99)
Glucose-Capillary: 175 mg/dL — ABNORMAL HIGH (ref 70–99)
Glucose-Capillary: 145 mg/dL — ABNORMAL HIGH (ref 70–99)

## 2014-07-21 LAB — CBC
HEMATOCRIT: 29.4 % — AB (ref 36.0–46.0)
Hemoglobin: 8.8 g/dL — ABNORMAL LOW (ref 12.0–15.0)
MCH: 29.7 pg (ref 26.0–34.0)
MCHC: 29.9 g/dL — ABNORMAL LOW (ref 30.0–36.0)
MCV: 99.3 fL (ref 78.0–100.0)
Platelets: 328 10*3/uL (ref 150–400)
RBC: 2.96 MIL/uL — ABNORMAL LOW (ref 3.87–5.11)
RDW: 14.4 % (ref 11.5–15.5)
WBC: 16 10*3/uL — ABNORMAL HIGH (ref 4.0–10.5)

## 2014-07-21 LAB — MAGNESIUM
MAGNESIUM: 2.1 mg/dL (ref 1.5–2.5)
Magnesium: 2.2 mg/dL (ref 1.5–2.5)
Magnesium: 2.2 mg/dL (ref 1.5–2.5)

## 2014-07-21 MED ORDER — CETYLPYRIDINIUM CHLORIDE 0.05 % MT LIQD: 7.0000 mL | Freq: Four times a day (QID) | OROMUCOSAL | Status: DC

## 2014-07-21 MED ORDER — DEXMEDETOMIDINE HCL IN NACL 400 MCG/100ML IV SOLN
0.4000 ug/kg/h | INTRAVENOUS | Status: DC
  Administered 2014-07-21 (×3): 1.2 ug/kg/h via INTRAVENOUS
  Administered 2014-07-21: 0.9 ug/kg/h via INTRAVENOUS
  Administered 2014-07-21: 1 ug/kg/h via INTRAVENOUS
  Administered 2014-07-21: 0.6 ug/kg/h via INTRAVENOUS
  Administered 2014-07-22 – 2014-07-23 (×8): 1.2 ug/kg/h via INTRAVENOUS
  Filled 2014-07-21: qty 50
  Filled 2014-07-21 (×2): qty 200
  Filled 2014-07-21 (×4): qty 50
  Filled 2014-07-21 (×2): qty 100
  Filled 2014-07-21: qty 50
  Filled 2014-07-21 (×2): qty 100

## 2014-07-21 MED ORDER — METOPROLOL TARTRATE 12.5 MG HALF TABLET
12.5000 mg | ORAL_TABLET | Freq: Two times a day (BID) | ORAL | Status: DC
  Administered 2014-07-21 – 2014-07-27 (×14): 12.5 mg via ORAL
  Filled 2014-07-21 (×16): qty 1

## 2014-07-21 MED ORDER — METHYLPREDNISOLONE SODIUM SUCC 125 MG IJ SOLR
60.0000 mg | Freq: Two times a day (BID) | INTRAMUSCULAR | Status: DC
  Administered 2014-07-21 – 2014-07-22 (×2): 60 mg via INTRAVENOUS
  Filled 2014-07-21 (×4): qty 0.96

## 2014-07-21 MED ORDER — CHLORHEXIDINE GLUCONATE 0.12 % MT SOLN: 15.0000 mL | Freq: Two times a day (BID) | OROMUCOSAL | Status: DC

## 2014-07-21 NOTE — Progress Notes (Signed)
RT note- Did not tolerate wean, low RR and minute ventilation, HR 144 with increased work of breathing to trigger the vent. Placed back to full support.

## 2014-07-21 NOTE — Progress Notes (Signed)
RT note- placed to wean by MD.

## 2014-07-21 NOTE — Progress Notes (Signed)
PULMONARY / CRITICAL CARE MEDICINE   Name: Elizabeth [name] MRN: [id number] DOB: 12-Apr-1956    ADMISSION DATE:  07/20/2014  REFERRING MD :  Ranae Palms, ED  CHIEF COMPLAINT:  resp distress  INITIAL PRESENTATION: 3rd  Admission in last 2 months for 58 year old with severe COPD on home oxygen but acute respiratory failure due to COPD exacerbation complicated by bilateral pneumothoraces and mechanical ventilation  STUDIES:   SIGNIFICANT EVENTS: 9/23 Bl chest tubes in ED 9/24 failed SBT > tachycardic, diaphoretic   SUBJECTIVE: yesterday admitted with bilat chest tubes, vent, now levo off, UOP up, low grade temp, anxious,   VITAL SIGNS: Temp:  [95.2 F (35.1 C)-100.9 F (38.3 C)] 99.8 F (37.7 C) (09/24 0900) Pulse Rate:  [92-122] 111 (09/24 0900) Resp:  [11-20] 12 (09/24 0900) BP: (76-129)/(51-80) 105/57 mmHg (09/24 0900) SpO2:  [95 %-100 %] 95 % (09/24 0900) FiO2 (%):  [40 %-80 %] 40 % (09/24 0900) Weight:  [66.1 kg (145 lb 11.6 oz)] 66.1 kg (145 lb 11.6 oz) (09/24 0407) HEMODYNAMICS:   VENTILATOR SETTINGS: Vent Mode:  [-] PRVC FiO2 (%):  [40 %-80 %] 40 % Set Rate:  [12 bmp] 12 bmp Vt Set:  [450 mL-490 mL] 450 mL PEEP:  [0 cmH20] 0 cmH20 Plateau Pressure:  [17 cmH20-25 cmH20] 24 cmH20 INTAKE / OUTPUT:  Intake/Output Summary (Last 24 hours) at 07/21/14 0942 Last data filed at 07/21/14 0900  Gross per 24 hour  Intake 3710.35 ml  Output    905 ml  Net 2805.35 ml    PHYSICAL EXAMINATION: Gen: anxious on vent HEENT: NCAT, EOMi, ETT in place PULM: Poor air movement, no wheezing CV: Tachy, regular, no mgr, no JVD AB: BS+, soft, nontender, Ext: warm, no edema, no clubbing, no cyanosis Derm: palpable crepitance from neck to knees Neuro: awake and alert on vent, anxious   LABS:  CBC  Recent Labs Lab 07/20/14 0554 07/21/14 0400  WBC 14.5* 16.0*  HGB 11.4* 8.8*  HCT 40.8 29.4*  PLT 398 328   Coag's No results found for this basename: APTT, INR,  in the last  168 hours BMET  Recent Labs Lab 07/20/14 0554 07/21/14 0400  NA 141 139  K 3.9 4.5  CL 90* 96  CO2 45* 38*  BUN 11 15  CREATININE 0.47* 0.50  GLUCOSE 135* 135*   Electrolytes  Recent Labs Lab 07/20/14 0554 07/20/14 1310 07/20/14 2345 07/21/14 0400  CALCIUM 9.3  --   --  8.4  MG  --  2.2 2.1 2.2  PHOS  --  1.5* 2.1* 2.1*   Sepsis Markers  Recent Labs Lab 07/20/14 0557  LATICACIDVEN 0.67   ABG  Recent Labs Lab 07/21/14 0335  PHART 7.364  PCO2ART 67.6*  PO2ART 145.0*   Liver Enzymes  Recent Labs Lab 07/20/14 0554  AST 16  ALT 18  ALKPHOS 54  BILITOT 0.6  ALBUMIN 3.7   Cardiac Enzymes  Recent Labs Lab 07/20/14 1123 07/20/14 1310 07/20/14 1906  TROPONINI <0.30 <0.30 <0.30   Glucose  Recent Labs Lab 07/20/14 1210 07/20/14 1605 07/20/14 1911 07/21/14 0031 07/21/14 0402 07/21/14 0804  GLUCAP 212* 181* 136* 145* 147* 149*    Imaging Dg Chest Port 1 View  07/20/2014   CLINICAL DATA:  Status post left central line placement  EXAM: PORTABLE CHEST - 1 VIEW  COMPARISON:  Portable chest x-ray of 7:33 a.m. today.  FINDINGS: There is extensive subcutaneous emphysema. There are bilateral chest tubes in place. No pneumothorax is  demonstrated currently. The cardiac silhouette is normal in size. The pulmonary vascularity is not engorged. There is bibasilar atelectasis. The left internal jugular venous tap catheter tip overlies the junction of the right and left brachiocephalic veins. The endotracheal tube tip lies approximately 4.5 cm above the crotch of the carina. The left-sided chest tube tip lies medially overlying the T4 vertebral body. The right chest tube tip lies in the pulmonary apex. The esophagogastric tube tip projects below the inferior margin of the image.  IMPRESSION: There is no postprocedure complication following placement of the left internal jugular venous catheter. Extensive subcutaneous emphysema persists. There is no visible  pneumothorax. There is bibasilar subsegmental atelectasis.   Electronically Signed   By: David  [place]   On: 07/20/2014 12:29   Dg Chest Portable 1 View  07/20/2014   CLINICAL DATA:  Chest tube insertion for pneumothorax  EXAM: PORTABLE CHEST - 1 VIEW  COMPARISON:  Study obtained earlier in the day  FINDINGS: There is a new chest tube on the right with the tip at the right apex. There has been interval resolution of a pneumothorax on the right. No pneumothorax is currently appreciable.  Endotracheal tube tip is 4.1 cm above the carina. There is a chest tube on the left. Nasogastric tube tip and side port are below the diaphragm.  Widespread subcutaneous emphysema is again noted. There is mild interstitial edema in the lungs. No consolidation. Heart size and pulmonary vascularity are normal. No adenopathy.  IMPRESSION: Interval resolution of pneumothorax the right following chest tube placement. There is mild interstitial edema in the lungs, stable. No new opacity. Extensive subcutaneous emphysema remains.   Electronically Signed   By: Bretta Bang M.D.   On: 07/20/2014 07:48   Dg Chest Portable 1 View  07/20/2014   CLINICAL DATA:  Shortness of Breath  EXAM: PORTABLE CHEST - 1 VIEW  COMPARISON:  07/20/2014  FINDINGS: Cardiomediastinal silhouette is stable. There is a left chest tube. Endotracheal tube and NG tube in place. Bilateral chest wall subcutaneous emphysema. There is about 40% new right pneumothorax and partially collapsed right lung.  IMPRESSION: Left chest tube in place. No left pneumothorax. Bilateral chest wall subcutaneous emphysema. New about 40% right pneumothorax. Critical Value/emergent results were called by telephone at the time of interpretation on 07/20/2014 at 7:19 am to Dr. Loren Racer , who verbally acknowledged these results.   Electronically Signed   By: Natasha Mead M.D.   On: 07/20/2014 07:19   Dg Chest Portable 1 View  07/20/2014   CLINICAL DATA:  Shortness of breath   EXAM: PORTABLE CHEST - 1 VIEW  COMPARISON:  Prior radiograph from 07/02/2014  FINDINGS: The cardiac and mediastinal silhouettes are stable in size and contour, and remain within normal limits.  The lungs are hyperinflated with emphysematous changes present. No airspace consolidation or pulmonary edema. Minimal chronic blunting of the bilateral costophrenic angles is stable. No pleural effusion or pneumothorax.  No acute osseous abnormality identified.  IMPRESSION: Emphysema.  No active cardiopulmonary disease.   Electronically Signed   By: Rise Mu M.D.   On: 07/20/2014 06:33   Dg Chest Portable 1 View  07/20/2014   CLINICAL DATA:  Shortness of breath  EXAM: PORTABLE CHEST - 1 VIEW  COMPARISON:  Chest x-ray from the same day at 5:29.  FINDINGS: Tracheal intubation, tip between the clavicular heads and carina. An orogastric tube at least reaches the diaphragm.  Normal heart size and mediastinal contours.  Severe pulmonary hyperinflation,  with nonvisualization of the left base. Bullous emphysema, especially the left base. No superimposed edema, effusion, pneumonia, or pneumothorax.  IMPRESSION: 1. New endotracheal and orogastric tubes are in good position. 2. Severe emphysema.  No acute superimposed findings.   Electronically Signed   By: Tiburcio Pea M.D.   On: 07/20/2014 06:34   Dg Chest Portable 1 View  07/20/2014   CLINICAL DATA:  Assess pneumothorax.  EXAM: PORTABLE CHEST - 1 VIEW  COMPARISON:  Chest x-ray from earlier the same day at 5:46 a.m.  FINDINGS: Endotracheal tube ends between the clavicular heads and carina. A gastric suction tube likely reaches the stomach, although is partially imaged.  Normal heart size.  Negative aortic contours.  Severe pulmonary hyperinflation with bullous change. No edema, pneumonia, effusion, or visible pneumothorax.  IMPRESSION: 1. Endotracheal tube in good position. 2. Orogastric tube side port may be at the GE junction. Consider abdominal imaging. 3.  Bullous emphysema.  No acute superimposed findings.   Electronically Signed   By: Tiburcio Pea M.D.   On: 07/20/2014 06:23     ASSESSMENT / PLAN:  PULMONARY OETT9/23  A:  Acute respiratory failure with hypercarbia Severe COPD on home oxygen -bullous emphysema, end stage Bilateral pneumothorax ? 9/24 both tubes had air leak L > R at -20cm H20 Acute exacerbation of COPD May be very difficult to get her off vent P:   Continue bilateral chest tubes to suction for now Daily CXR Continue full vent support, low PEEP Low set RR on vent Wean solumedrol Duonebs q 4h Pulmicort and brovana  CARDIOVASCULAR  A: Hypertension Poor EKG complexes on monitor due to subcutaneous emphysema P:  Treat pain Restart metoprolol  RENAL A:  No issues P:   Hydrate  GASTROINTESTINAL A:  No issues P:   Start tube feeds as soon as clinically stable  HEMATOLOGIC A:  No issues P:  Monitor WBC count  INFECTIOUS A:  Fever but no evidence of infection P:   Levaquin empirically for AE COPD  ENDOCRINE A:  Not a known diabetic   P:   SSI  NEUROLOGIC A:  Pain Severe anxiety Fentanyl suppressing respiratory drive P:   RASS goal: -1 Use precedex for anxiety/vent synchrony Use fentanyl for air hunger and pain  TODAY'S SUMMARY: severe, end stage emphysema with multiple recurrent admissions, now with bilateral pneumothoraces.  Will be difficult to extubate.  Sister updated at bedside 9/24.  She failed SBT on rounds.   I have personally obtained a history, examined the patient, evaluated laboratory and imaging results, formulated the assessment and plan and placed orders. CRITICAL CARE: The patient is critically ill with multiple organ systems failure and requires high complexity decision making for assessment and support, frequent evaluation and titration of therapies, application of advanced monitoring technologies and extensive interpretation of multiple databases. Critical Care Time  devoted to patient care services described in this note is 45 minutes.   Heber Rosholt, MD [place] [facility] Pager: [phone] Cell: [phone] If no response, call [phone]  07/21/2014, 9:42 AM

## 2014-07-21 NOTE — Progress Notes (Signed)
CRITICAL VALUE ALERT  Critical value received: pCO2 67.6  Date of notification:  07/21/2014  Time of notification:  0350  Critical value read back:Yes.    Nurse who received alert:  Lillia Corporal  MD notified (1st page):  Dr. Darrick Penna  Time of first page:  0355  MD notified (2nd page):  Time of second page:  Responding MD:  Dr. Darrick Penna  Time MD responded:  [phone]

## 2014-07-22 ENCOUNTER — Inpatient Hospital Stay (HOSPITAL_COMMUNITY): Payer: BC Managed Care – PPO

## 2014-07-22 LAB — GLUCOSE, CAPILLARY
Glucose-Capillary: 200 mg/dL — ABNORMAL HIGH (ref 70–99)
Glucose-Capillary: 160 mg/dL — ABNORMAL HIGH (ref 70–99)
Glucose-Capillary: 143 mg/dL — ABNORMAL HIGH (ref 70–99)
Glucose-Capillary: 168 mg/dL — ABNORMAL HIGH (ref 70–99)
Glucose-Capillary: 134 mg/dL — ABNORMAL HIGH (ref 70–99)
Glucose-Capillary: 134 mg/dL — ABNORMAL HIGH (ref 70–99)

## 2014-07-22 LAB — BASIC METABOLIC PANEL WITH GFR
Anion gap: 9 (ref 5–15)
BUN: 15 mg/dL (ref 6–23)
CO2: 31 meq/L (ref 19–32)
Calcium: 7.8 mg/dL — ABNORMAL LOW (ref 8.4–10.5)
Chloride: 100 meq/L (ref 96–112)
Creatinine, Ser: 0.41 mg/dL — ABNORMAL LOW (ref 0.50–1.10)
GFR calc Af Amer: >90 mL/min
GFR calc non Af Amer: >90 mL/min
Glucose, Bld: 206 mg/dL — ABNORMAL HIGH (ref 70–99)
Potassium: 4.3 meq/L (ref 3.7–5.3)
Sodium: 140 meq/L (ref 137–147)

## 2014-07-22 LAB — CBC WITH DIFFERENTIAL/PLATELET
Basophils Absolute: 0 10*3/uL (ref 0.0–0.1)
Basophils Relative: 0 % (ref 0–1)
Eosinophils Absolute: 0 10*3/uL (ref 0.0–0.7)
Eosinophils Relative: 0 % (ref 0–5)
HCT: 26.6 % — ABNORMAL LOW (ref 36.0–46.0)
Hemoglobin: 8.1 g/dL — ABNORMAL LOW (ref 12.0–15.0)
Lymphocytes Relative: 3 % — ABNORMAL LOW (ref 12–46)
Lymphs Abs: 0.5 10*3/uL — ABNORMAL LOW (ref 0.7–4.0)
MCH: 29.3 pg (ref 26.0–34.0)
MCHC: 30.5 g/dL (ref 30.0–36.0)
MCV: 96.4 fL (ref 78.0–100.0)
Monocytes Absolute: 0.7 10*3/uL (ref 0.1–1.0)
Monocytes Relative: 4 % (ref 3–12)
Neutro Abs: 15.1 10*3/uL — ABNORMAL HIGH (ref 1.7–7.7)
Neutrophils Relative %: 93 % — ABNORMAL HIGH (ref 43–77)
Platelets: 258 10*3/uL (ref 150–400)
RBC: 2.76 MIL/uL — ABNORMAL LOW (ref 3.87–5.11)
RDW: 15.5 % (ref 11.5–15.5)
WBC: 16.3 10*3/uL — ABNORMAL HIGH (ref 4.0–10.5)

## 2014-07-22 LAB — MAGNESIUM
Magnesium: 2.4 mg/dL (ref 1.5–2.5)
Magnesium: 2.4 mg/dL (ref 1.5–2.5)

## 2014-07-22 LAB — PHOSPHORUS
PHOSPHORUS: 1.9 mg/dL — AB (ref 2.3–4.6)
Phosphorus: 1.6 mg/dL — ABNORMAL LOW (ref 2.3–4.6)

## 2014-07-22 LAB — CBC
HCT: 26.3 % — ABNORMAL LOW (ref 36.0–46.0)
Hemoglobin: 8.2 g/dL — ABNORMAL LOW (ref 12.0–15.0)
MCH: 30.1 pg (ref 26.0–34.0)
MCHC: 31.2 g/dL (ref 30.0–36.0)
MCV: 96.7 fL (ref 78.0–100.0)
PLATELETS: 272 10*3/uL (ref 150–400)
RBC: 2.72 MIL/uL — ABNORMAL LOW (ref 3.87–5.11)
RDW: 15.4 % (ref 11.5–15.5)
WBC: 16 10*3/uL — AB (ref 4.0–10.5)

## 2014-07-22 MED ORDER — ACETAMINOPHEN 160 MG/5ML PO SOLN
650.0000 mg | Freq: Four times a day (QID) | ORAL | Status: DC | PRN
  Administered 2014-07-22 – 2014-08-12 (×5): 650 mg
  Filled 2014-07-22 (×5): qty 20.3

## 2014-07-22 MED ORDER — SODIUM CHLORIDE 0.9 % IV BOLUS (SEPSIS)
500.0000 mL | Freq: Once | INTRAVENOUS | Status: AC
  Administered 2014-07-22: 500 mL via INTRAVENOUS

## 2014-07-22 MED ORDER — ALTEPLASE 100 MG IV SOLR
2.0000 mg | Freq: Once | INTRAVENOUS | Status: AC
  Administered 2014-07-22: 2 mg
  Filled 2014-07-22: qty 2

## 2014-07-22 MED ORDER — CLONAZEPAM 0.1 MG/ML ORAL SUSPENSION: 1.0000 mg | Freq: Two times a day (BID) | ORAL | Status: DC

## 2014-07-22 MED ORDER — K PHOS MONO-SOD PHOS DI & MONO 155-852-130 MG PO TABS
250.0000 mg | ORAL_TABLET | Freq: Three times a day (TID) | ORAL | Status: AC
  Administered 2014-07-22 (×2): 250 mg via ORAL
  Filled 2014-07-22 (×2): qty 1

## 2014-07-22 MED ORDER — CLONAZEPAM 0.5 MG PO TABS
1.0000 mg | ORAL_TABLET | Freq: Two times a day (BID) | ORAL | Status: DC
  Administered 2014-07-22 (×2): 1 mg via ORAL
  Filled 2014-07-22 (×2): qty 2

## 2014-07-22 MED ORDER — METHYLPREDNISOLONE SODIUM SUCC 125 MG IJ SOLR
60.0000 mg | Freq: Every day | INTRAMUSCULAR | Status: DC
  Administered 2014-07-23 – 2014-07-25 (×3): 60 mg via INTRAVENOUS
  Filled 2014-07-22 (×3): qty 0.96

## 2014-07-22 NOTE — Progress Notes (Signed)
Pt's SBP 80s, Dr. Kendrick Fries called and new orders carried out.

## 2014-07-22 NOTE — Progress Notes (Signed)
Sputum sent to lab at this time.

## 2014-07-22 NOTE — Progress Notes (Signed)
Talked with Dr. Darrick Penna regarding the morning CXR and pt's [facility] position. PIV inserted per request. Made aware of crepitus on arms and legs.

## 2014-07-22 NOTE — Progress Notes (Signed)
eLink Physician-Brief Progress Note Patient Name: Elizabeth [name] DOB: 08/14/1956 MRN: [id number]   Date of Service  07/22/2014  HPI/Events of Note  BP readings a little better in leg HR unchanged Received clonazepam around 10pm  eICU Interventions  Check lactic acid, if normal, suspect this is circadian hypotension and we can tolerate MAP to 55 or so If consistently lower then will place a-line     Intervention Category Major Interventions: Hypotension - evaluation and management  MCQUAID, DOUGLAS 07/22/2014, 11:42 PM

## 2014-07-22 NOTE — Progress Notes (Signed)
tPA placed in both white and blue port, dead end cap placed on each port. tPA will dwell for 2 hours. Consuello Masse

## 2014-07-22 NOTE — Progress Notes (Signed)
Unable to draw back blood from pt's [facility] and lab notified to draw morning blood.

## 2014-07-22 NOTE — Progress Notes (Signed)
Visited with one of pt's sisters Stephanie Coup). She says she is doing all right, and that she and other family members know the pt's wishes regarding living on a ventilator (says pt does not want to live on a ventilator). She and other family members take turns sitting with pt, as they know that pt does not want to be by herself. Will follow as needed.   07/22/14 0900  Clinical Encounter Type  Visited With Family  Visit Type Follow-up   Wille Glaser 07/22/2014 9:18 AM

## 2014-07-22 NOTE — Progress Notes (Signed)
PULMONARY / CRITICAL CARE MEDICINE   Name: Elizabeth [name] MRN: [id number] DOB: 1956/05/05    ADMISSION DATE:  07/20/2014  REFERRING MD :  Ranae Palms, ED  CHIEF COMPLAINT:  resp distress  INITIAL PRESENTATION: 3rd  Admission in last 2 months for 58 year old with severe COPD on home oxygen but acute respiratory failure due to COPD exacerbation complicated by bilateral pneumothoraces and mechanical ventilation  STUDIES:   SIGNIFICANT EVENTS: 9/23 Bl chest tubes in ED 9/24 failed SBT > tachycardic, diaphoretic 9/25 failed SBT again   SUBJECTIVE: failed SBT again this morning; hgb down, replace phos, extreme anxiety, adding clonazepam  VITAL SIGNS: Temp:  [98.7 F (37.1 C)-101.1 F (38.4 C)] 99.1 F (37.3 C) (09/25 0700) Pulse Rate:  [72-192] 105 (09/25 0753) Resp:  [12-24] 12 (09/25 0753) BP: (86-148)/(51-114) 130/74 mmHg (09/25 0753) SpO2:  [94 %-100 %] 98 % (09/25 0753) FiO2 (%):  [40 %] 40 % (09/25 0753) Weight:  [66.3 kg (146 lb 2.6 oz)] 66.3 kg (146 lb 2.6 oz) (09/25 0433) HEMODYNAMICS:   VENTILATOR SETTINGS: Vent Mode:  [-] PRVC FiO2 (%):  [40 %] 40 % Set Rate:  [12 bmp] 12 bmp Vt Set:  [450 mL-490 mL] 490 mL PEEP:  [0 cmH20] 0 cmH20 Pressure Support:  [5 cmH20] 5 cmH20 Plateau Pressure:  [17 cmH20-22 cmH20] 18 cmH20 INTAKE / OUTPUT:  Intake/Output Summary (Last 24 hours) at 07/22/14 0813 Last data filed at 07/22/14 0600  Gross per 24 hour  Intake 3579.06 ml  Output   1040 ml  Net 2539.06 ml    PHYSICAL EXAMINATION: Gen: anxious on vent HEENT: NCAT, EOMi, ETT in place PULM: Poor air movement, no wheezing CV: Tachy, regular, no mgr, no JVD AB: BS+, soft, nontender, Ext: warm, no edema, no clubbing, no cyanosis Derm: palpable crepitance from face to knees Neuro: awake and alert on vent, anxious   LABS:  CBC  Recent Labs Lab 07/20/14 0554 07/21/14 0400  WBC 14.5* 16.0*  HGB 11.4* 8.8*  HCT 40.8 29.4*  PLT 398 328   Coag's No results found  for this basename: APTT, INR,  in the last 168 hours BMET  Recent Labs Lab 07/20/14 0554 07/21/14 0400  NA 141 139  K 3.9 4.5  CL 90* 96  CO2 45* 38*  BUN 11 15  CREATININE 0.47* 0.50  GLUCOSE 135* 135*   Electrolytes  Recent Labs Lab 07/20/14 0554  07/21/14 0400 07/21/14 1220 07/22/14 0100  CALCIUM 9.3  --  8.4  --   --   MG  --   < > 2.2 2.2 2.4  PHOS  --   < > 2.1* 2.4 1.9*  < > = values in this interval not displayed. Sepsis Markers  Recent Labs Lab 07/20/14 0557  LATICACIDVEN 0.67   ABG  Recent Labs Lab 07/21/14 0335  PHART 7.364  PCO2ART 67.6*  PO2ART 145.0*   Liver Enzymes  Recent Labs Lab 07/20/14 0554  AST 16  ALT 18  ALKPHOS 54  BILITOT 0.6  ALBUMIN 3.7   Cardiac Enzymes  Recent Labs Lab 07/20/14 1123 07/20/14 1310 07/20/14 1906  TROPONINI <0.30 <0.30 <0.30   Glucose  Recent Labs Lab 07/21/14 1246 07/21/14 1544 07/21/14 2011 07/22/14 0033 07/22/14 0036 07/22/14 0429  GLUCAP 175* 145* 146* <10* 200* 160*    Imaging Dg Chest Port 1 View  07/21/2014   CLINICAL DATA:  Shortness of breath  EXAM: PORTABLE CHEST - 1 VIEW  COMPARISON:  07/20/2014  FINDINGS: Endotracheal tube  stable at 4.7 cm above the carina. A left jugular line is noted in the proximal superior vena cava. Nasogastric catheter is noted passing into the stomach. Bilateral chest tubes are again identified and stable. Diffuse subcutaneous emphysema is noted. No residual pneumothorax is noted.  IMPRESSION: Tubes and lines as described.  No residual pneumothorax.  Diffuse subcutaneous emphysema.   Electronically Signed   By: Alcide Clever M.D.   On: 07/21/2014 07:10     ASSESSMENT / PLAN:  PULMONARY OETT9/23  A:  Acute respiratory failure with hypercarbia Severe COPD on home oxygen -bullous emphysema, end stage Bilateral pneumothorax > 9/25 no air leak on R, small on L  Acute exacerbation of COPD May be very difficult to get her off vent, anxiety big factor P:    Continue bilateral chest tubes to suction for now Daily CXR Continue full vent support,   Low set RR on vent continue solumedrol, wean to once daily Duonebs q 4h Pulmicort and brovana See below  NEUROLOGIC A:  Pain Severe anxiety Fentanyl suppressing respiratory drive P:   RASS goal: -1 Use precedex for anxiety/vent synchrony Use fentanyl for air hunger and pain Add clonazepam scheduled for anxiety  CARDIOVASCULAR  A: Hypertension P:  Continue metoprolol  RENAL A:  No issues P:   Hydrate  GASTROINTESTINAL A:  No issues P:   Continue tube feedings  HEMATOLOGIC A:  No issues P:  Monitor WBC count  INFECTIOUS A:  Fever but no evidence of infection or sepsis P:   9/23 Levaquin empirically for AE COPD >> plan 7 days Blood culture 9/25 > resp culture 9/25 >   Change antibiotics if hemodynamic instablilty  ENDOCRINE A:  Not a known diabetic   P:   SSI    TODAY'S SUMMARY: severe, end stage emphysema with multiple recurrent admissions, now with bilateral pneumothoraces.  Will be difficult to extubate.  Sister updated at bedside 9/24.  She failed SBT on rounds again 9/25.   I have personally obtained a history, examined the patient, evaluated laboratory and imaging results, formulated the assessment and plan and placed orders. CRITICAL CARE: The patient is critically ill with multiple organ systems failure and requires high complexity decision making for assessment and support, frequent evaluation and titration of therapies, application of advanced monitoring technologies and extensive interpretation of multiple databases. Critical Care Time devoted to patient care services described in this note is 45 minutes.   Heber Tokeland, MD  PCCM Pager: [phone] Cell: [phone] If no response, call [phone]  07/22/2014, 8:13 AM

## 2014-07-22 NOTE — Progress Notes (Signed)
eLink Physician-Brief Progress Note Patient Name: Elizabeth [name] DOB: 02-21-1956 MRN: [id number]   Date of Service  07/22/2014  HPI/Events of Note  Low BP over last 30 - 45 min Otherwise no major changes  eICU Interventions  Bolus 500cc saline now     Intervention Category Major Interventions: Hypotension - evaluation and management  [name] 07/22/2014, 10:47 PM

## 2014-07-23 ENCOUNTER — Inpatient Hospital Stay (HOSPITAL_COMMUNITY): Payer: BC Managed Care – PPO

## 2014-07-23 ENCOUNTER — Inpatient Hospital Stay (HOSPITAL_COMMUNITY): Payer: BC Managed Care – PPO | Admitting: Anesthesiology

## 2014-07-23 LAB — CBC WITH DIFFERENTIAL/PLATELET
Basophils Absolute: 0 10*3/uL (ref 0.0–0.1)
Basophils Relative: 0 % (ref 0–1)
Eosinophils Absolute: 0 10*3/uL (ref 0.0–0.7)
Eosinophils Relative: 0 % (ref 0–5)
HCT: 25.8 % — ABNORMAL LOW (ref 36.0–46.0)
Hemoglobin: 8 g/dL — ABNORMAL LOW (ref 12.0–15.0)
Lymphocytes Relative: 28 % (ref 12–46)
Lymphs Abs: 4.5 10*3/uL — ABNORMAL HIGH (ref 0.7–4.0)
MCH: 29.2 pg (ref 26.0–34.0)
MCHC: 31 g/dL (ref 30.0–36.0)
MCV: 94.2 fL (ref 78.0–100.0)
Monocytes Absolute: 1 10*3/uL (ref 0.1–1.0)
Monocytes Relative: 6 % (ref 3–12)
Neutro Abs: 10.6 10*3/uL — ABNORMAL HIGH (ref 1.7–7.7)
Neutrophils Relative %: 66 % (ref 43–77)
Platelets: 251 10*3/uL (ref 150–400)
RBC: 2.74 MIL/uL — ABNORMAL LOW (ref 3.87–5.11)
RDW: 15.8 % — ABNORMAL HIGH (ref 11.5–15.5)
WBC: 16.1 10*3/uL — ABNORMAL HIGH (ref 4.0–10.5)

## 2014-07-23 LAB — BASIC METABOLIC PANEL
Anion gap: 10 (ref 5–15)
BUN: 15 mg/dL (ref 6–23)
CO2: 28 mEq/L (ref 19–32)
Calcium: 7.6 mg/dL — ABNORMAL LOW (ref 8.4–10.5)
Chloride: 102 mEq/L (ref 96–112)
Creatinine, Ser: 0.54 mg/dL (ref 0.50–1.10)
GFR calc Af Amer: >90 mL/min (ref >90)
GFR calc non Af Amer: >90 mL/min (ref >90)
Glucose, Bld: 146 mg/dL — ABNORMAL HIGH (ref 70–99)
Potassium: 4 mEq/L (ref 3.7–5.3)
Sodium: 140 mEq/L (ref 137–147)

## 2014-07-23 LAB — GLUCOSE, CAPILLARY
GLUCOSE-CAPILLARY: 140 mg/dL — AB (ref 70–99)
GLUCOSE-CAPILLARY: 127 mg/dL — AB (ref 70–99)
Glucose-Capillary: 142 mg/dL — ABNORMAL HIGH (ref 70–99)

## 2014-07-23 LAB — MAGNESIUM: Magnesium: 2.3 mg/dL (ref 1.5–2.5)

## 2014-07-23 LAB — PHOSPHORUS: Phosphorus: 2.1 mg/dL — ABNORMAL LOW (ref 2.3–4.6)

## 2014-07-23 LAB — LACTIC ACID, PLASMA: Lactic Acid, Venous: 0.9 mmol/L (ref 0.5–2.2)

## 2014-07-23 MED ORDER — SODIUM CHLORIDE 0.9 % IV BOLUS (SEPSIS)
500.0000 mL | Freq: Once | INTRAVENOUS | Status: AC
  Administered 2014-07-23: 500 mL via INTRAVENOUS

## 2014-07-23 MED ORDER — SUCCINYLCHOLINE CHLORIDE 20 MG/ML IJ SOLN
INTRAMUSCULAR | Status: DC | PRN
  Administered 2014-07-23: 100 mg via INTRAVENOUS

## 2014-07-23 MED ORDER — SODIUM CHLORIDE 0.9 % IV SOLN
1.0000 mg/h | INTRAVENOUS | Status: DC
  Administered 2014-07-23: 2 mg/h via INTRAVENOUS
  Administered 2014-07-24 – 2014-07-25 (×4): 5 mg/h via INTRAVENOUS
  Administered 2014-07-26: 3 mg/h via INTRAVENOUS
  Administered 2014-07-26: 5 mg/h via INTRAVENOUS
  Administered 2014-07-27 – 2014-07-28 (×3): 2 mg/h via INTRAVENOUS
  Filled 2014-07-23 (×12): qty 10

## 2014-07-23 MED ORDER — ETOMIDATE 2 MG/ML IV SOLN
INTRAVENOUS | Status: DC | PRN
  Administered 2014-07-23: 12 mg via INTRAVENOUS

## 2014-07-23 MED ORDER — POTASSIUM PHOSPHATES 15 MMOLE/5ML IV SOLN
10.0000 mmol | Freq: Once | INTRAVENOUS | Status: AC
  Administered 2014-07-23: 10 mmol via INTRAVENOUS
  Filled 2014-07-23: qty 3.33

## 2014-07-23 NOTE — Anesthesia Postprocedure Evaluation (Signed)
  Anesthesia Post-op Note  Patient: Elizabeth [name]  Procedure(s) Performed: * No procedures listed *  Patient Location: ICU  Anesthesia Type:General  Level of Consciousness: Patient remains intubated per anesthesia plan  Airway and Oxygen Therapy: Patient remains intubated per anesthesia plan  Post-op Pain: none  Post-op Assessment: Post-op Vital signs reviewed  Post-op Vital Signs: Reviewed  Last Vitals:  Filed Vitals:   07/23/14 0600  BP: 101/52  Pulse: 111  Temp: 38.2 C  Resp: 13    Complications: No apparent anesthesia complications

## 2014-07-23 NOTE — Progress Notes (Signed)
Pt's vent alarming, this nurse walked into room, pt with safety mittens on holding ETT. Pt thrashing about, sats declining, bagged her, and called Dr. Kendrick Fries.

## 2014-07-23 NOTE — Transfer of Care (Signed)
Immediate Anesthesia Transfer of Care Note  Patient: Elizabeth [name]  Procedure(s) Performed: * No procedures listed *  Patient Location: ICU  Anesthesia Type:General  Level of Consciousness: Patient remains intubated per anesthesia plan  Airway & Oxygen Therapy: Patient remains intubated per anesthesia plan and Patient placed on Ventilator (see vital sign flow sheet for setting)  Post-op Assessment: Report given to PACU RN and Post -op Vital signs reviewed and stable  Post vital signs: Reviewed and stable  Complications: No apparent anesthesia complications

## 2014-07-23 NOTE — Progress Notes (Signed)
Pt noted with decreased UO last 4 hours. 30CC/2h. Dr. Kendrick Fries notified.

## 2014-07-23 NOTE — Progress Notes (Signed)
PULMONARY / CRITICAL CARE MEDICINE   Name: Elizabeth [name] MRN: [id number] DOB: 03/20/1956    ADMISSION DATE:  07/20/2014  REFERRING MD :  Ranae Palms, ED  CHIEF COMPLAINT:  resp distress  INITIAL PRESENTATION: 3rd  Admission in last 2 months for 58 year old with severe COPD on home oxygen but acute respiratory failure due to COPD exacerbation complicated by bilateral pneumothoraces and mechanical ventilation  STUDIES:   SIGNIFICANT EVENTS: 9/23 Bl chest tubes in ED 9/24 failed SBT > tachycardic, diaphoretic 9/25 failed SBT again   SUBJECTIVE:  07/23/14: Self extubated. Had to be re-intubated.   VITAL SIGNS: Temp:  [100.4 F (38 C)-101.5 F (38.6 C)] 100.4 F (38 C) (09/26 0700) Pulse Rate:  [70-111] 104 (09/26 0700) Resp:  [10-19] 14 (09/26 0700) BP: (77-166)/(52-110) 101/66 mmHg (09/26 0700) SpO2:  [96 %-100 %] 100 % (09/26 0700) FiO2 (%):  [30 %-40 %] 40 % (09/26 0738) Weight:  [67.7 kg (149 lb 4 oz)] 67.7 kg (149 lb 4 oz) (09/26 0335) HEMODYNAMICS: CVP:  [12 mmHg-22 mmHg] 19 mmHg VENTILATOR SETTINGS: Vent Mode:  [-] PRVC FiO2 (%):  [30 %-40 %] 40 % Set Rate:  [12 bmp-14 bmp] 14 bmp Vt Set:  [490 mL-500 mL] 500 mL PEEP:  [5 cmH20] 5 cmH20 Plateau Pressure:  [18 cmH20-28 cmH20] 25 cmH20 INTAKE / OUTPUT:  Intake/Output Summary (Last 24 hours) at 07/23/14 1217 Last data filed at 07/23/14 0600  Gross per 24 hour  Intake 2753.35 ml  Output    655 ml  Net 2098.35 ml    PHYSICAL EXAMINATION: Gen: anxious on vent, still w/ marked [place] edema  HEENT: NCAT, EOMi, ETT in place PULM: Poor air movement, no wheezing CV: Tachy, regular, no mgr, no JVD AB: BS+, soft, nontender, Ext: warm, no edema, no clubbing, no cyanosis Derm: palpable crepitance from face to knees Neuro: awake and alert on vent, anxious   LABS: PULMONARY  Recent Labs Lab 07/21/14 0335  PHART 7.364  PCO2ART 67.6*  PO2ART 145.0*  HCO3 37.2*  TCO2 39.3  O2SAT 98.9    CBC  Recent Labs Lab  07/22/14 1040 07/22/14 1512 07/23/14 0340  HGB 8.1* 8.2* 8.0*  HCT 26.6* 26.3* 25.8*  WBC 16.3* 16.0* 16.1*  PLT 258 272 251    COAGULATION No results found for this basename: INR,  in the last 168 hours  CARDIAC   Recent Labs Lab 07/20/14 0530 07/20/14 1123 07/20/14 1310 07/20/14 1906  TROPONINI <0.30 <0.30 <0.30 <0.30   No results found for this basename: PROBNP,  in the last 168 hours   CHEMISTRY  Recent Labs Lab 07/20/14 0554  07/21/14 0400 07/21/14 1220 07/22/14 0100 07/22/14 1040 07/23/14 0040 07/23/14 0340  NA 141  --  139  --   --  140  --  140  K 3.9  --  4.5  --   --  4.3  --  4.0  CL 90*  --  96  --   --  100  --  102  CO2 45*  --  38*  --   --  31  --  28  GLUCOSE 135*  --  135*  --   --  206*  --  146*  BUN 11  --  15  --   --  15  --  15  CREATININE 0.47*  --  0.50  --   --  0.41*  --  0.54  CALCIUM 9.3  --  8.4  --   --  7.8*  --  7.6*  MG  --   < > 2.2 2.2 2.4 2.4 2.3  --   PHOS  --   < > 2.1* 2.4 1.9* 1.6* 2.1*  --   < > = values in this interval not displayed. Estimated Creatinine Clearance: 76.4 ml/min (by C-G formula based on Cr of 0.54).   LIVER  Recent Labs Lab 07/20/14 0554  AST 16  ALT 18  ALKPHOS 54  BILITOT 0.6  PROT 6.7  ALBUMIN 3.7     INFECTIOUS  Recent Labs Lab 07/20/14 0557 07/23/14 0040  LATICACIDVEN 0.67 0.9     ENDOCRINE CBG (last 3)   Recent Labs  07/22/14 1949 07/23/14 0344 07/23/14 0725  GLUCAP 134* 140* 127*         IMAGING x48h Dg Chest Port 1 View  07/22/2014   CLINICAL DATA:  Endotracheal tube  EXAM: PORTABLE CHEST - 1 VIEW  COMPARISON:  Chest x-ray from yesterday  FINDINGS: Endotracheal tube tip ends between the carina and clavicular heads. Orogastric tube crosses the diaphragm. Left IJ catheter tip appears slightly retracted, tip in the region of the SVC origin or distal left brachiocephalic vein. Bilateral chest tubes have a similar orientation.  Subcutaneous gas appears diffusely  increased. There is a tiny right apical pneumothorax. There is pneumomediastinum which appears increased or new. No new parenchymal lung opacities or edema.  Normal heart size and upper mediastinal contours.  IMPRESSION: 1. Increased subcutaneous gas and pneumomediastinum since yesterday. 2. Tiny right pneumothorax.  Chest tubes remain in stable position.   Electronically Signed   By: Tiburcio Pea M.D.   On: 07/22/2014 05:49    Bilateral CT in good position. No clear infiltrates. No PTX. 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 Not ready to wean yet    P:   Continue bilateral chest tubes to suction for now Daily CXR Continue full vent support,   Low set RR on vent continue solumedrol, weaned to once daily Duonebs q 4h Pulmicort and brovana See below  NEUROLOGIC A:  Pain Severe anxiety Fentanyl suppressing respiratory drive P:   RASS goal: -1 Use precedex for anxiety/vent synchrony Use fentanyl for air hunger and pain Added clonazepam scheduled for anxiety Cont low dose versed   CARDIOVASCULAR  A: Hypertension P:  Continue metoprolol  RENAL A: mild low phos P:   Replete phos 07/24/15 and monitor Hydrate  GASTROINTESTINAL A:  No issues P:   Continue tube feedings  HEMATOLOGIC A:  No issues P:  Monitor WBC count  INFECTIOUS A:  Fever but no evidence of infection or sepsis P:   9/23 Levaquin empirically for AE COPD >> plan 7 days Blood culture 9/25 > resp culture 9/25 >  Change antibiotics if hemodynamic instablilty  ENDOCRINE A:  Not a known diabetic   P:   SSI    TODAY'S STAFF MD SUMMARY: severe, end stage emphysema with multiple recurrent admissions, now with  bilateral pneumothoraces.  Will be difficult to extubate.  Sister updated at bedside 9/26.  Failing SBT trials. Self extubated and require re-intubation. She is making slow improvement but still not ready to come off vent.   I have  personally obtained a history, examined the patient, evaluated laboratory and imaging results, formulated the assessment and plan and placed orders. CRITICAL CARE: The patient is critically ill with multiple organ systems failure and requires high complexity decision making for assessment and support, frequent evaluation and titration of therapies, application of advanced monitoring technologies and extensive interpretation of multiple databases. Critical Care Time devoted to patient care services described in this note is 35 minutes.  REst perNP   Anders Simmonds ACNP 07/23/2014, 12:17 PM   Dr. Kalman Shan, M.D., [facility].C.P Pulmonary and Critical Care Medicine Staff Physician [facility] Pager: [phone], If no answer or between  15:00h - 7:00h: call [phone]  07/23/2014 3:49 PM

## 2014-07-23 NOTE — Progress Notes (Addendum)
eLink Physician-Brief Progress Note Patient Name: Elizabeth [name] DOB: Dec 03, 1955 MRN: [id number]   Date of Service  07/23/2014  HPI/Events of Note  Patient self extubated, O2 saturation in 60's Respiratory/nursing responded and provided bag mask ventilation and O2 saturation improved.  PCCM fellow currently at [facility] in ICU. Oliguric  eICU Interventions  Have paged anesthesia MD on call Dr. Sampson Goon who will come to bedside.  Greatly appreciate his assistance.  Will change precedex to versed  Bolus saline      Intervention Category Major Interventions: Airway management;Respiratory failure - evaluation and management  MCQUAID, DOUGLAS 07/23/2014, 5:38 AM

## 2014-07-23 NOTE — Anesthesia Procedure Notes (Addendum)
Procedure Name: Intubation Date/Time: 07/23/2014 5:43 AM Performed by: Nicholos Johns Pre-anesthesia Checklist: Patient identified, Emergency Drugs available, Suction available, Patient being monitored and Timeout performed Patient Re-evaluated:Patient Re-evaluated prior to inductionOxygen Delivery Method: Ambu bag Preoxygenation: Pre-oxygenation with 100% oxygen Intubation Type: IV induction, Rapid sequence and Cricoid Pressure applied Ventilation: Mask ventilation without difficulty Laryngoscope Size: Mac and 3 Grade View: Grade I Tube type: Subglottic suction tube Tube size: 7.5 mm Number of attempts: 1 Airway Equipment and Method: Stylet Placement Confirmation: ETT inserted through vocal cords under direct vision,  positive ETCO2,  CO2 detector and breath sounds checked- equal and bilateral Secured at: 21 cm Tube secured with: Tape Dental Injury: Teeth and Oropharynx as per pre-operative assessment

## 2014-07-24 ENCOUNTER — Inpatient Hospital Stay (HOSPITAL_COMMUNITY): Payer: BC Managed Care – PPO

## 2014-07-24 LAB — CULTURE, RESPIRATORY

## 2014-07-24 LAB — COMPREHENSIVE METABOLIC PANEL
ALT: 29 U/L (ref 0–35)
ANION GAP: 9 (ref 5–15)
AST: 17 U/L (ref 0–37)
Albumin: 2.5 g/dL — ABNORMAL LOW (ref 3.5–5.2)
Alkaline Phosphatase: 37 U/L — ABNORMAL LOW (ref 39–117)
BUN: 14 mg/dL (ref 6–23)
CALCIUM: 8.4 mg/dL (ref 8.4–10.5)
CO2: 31 meq/L (ref 19–32)
CREATININE: 0.51 mg/dL (ref 0.50–1.10)
Chloride: 100 mEq/L (ref 96–112)
GFR calc non Af Amer: >90 mL/min (ref >90)
Glucose, Bld: 121 mg/dL — ABNORMAL HIGH (ref 70–99)
Potassium: 3.9 mEq/L (ref 3.7–5.3)
Sodium: 140 mEq/L (ref 137–147)
TOTAL PROTEIN: 4.9 g/dL — AB (ref 6.0–8.3)
Total Bilirubin: 0.8 mg/dL (ref 0.3–1.2)

## 2014-07-24 LAB — GLUCOSE, CAPILLARY
GLUCOSE-CAPILLARY: 144 mg/dL — AB (ref 70–99)
Glucose-Capillary: 126 mg/dL — ABNORMAL HIGH (ref 70–99)
Glucose-Capillary: 110 mg/dL — ABNORMAL HIGH (ref 70–99)
Glucose-Capillary: 174 mg/dL — ABNORMAL HIGH (ref 70–99)

## 2014-07-24 LAB — PHOSPHORUS: PHOSPHORUS: 3.1 mg/dL (ref 2.3–4.6)

## 2014-07-24 LAB — CULTURE, RESPIRATORY W GRAM STAIN: Special Requests: NORMAL

## 2014-07-24 LAB — CBC
HEMATOCRIT: 23.9 % — AB (ref 36.0–46.0)
HEMOGLOBIN: 7.5 g/dL — AB (ref 12.0–15.0)
MCH: 29.6 pg (ref 26.0–34.0)
MCHC: 31.4 g/dL (ref 30.0–36.0)
MCV: 94.5 fL (ref 78.0–100.0)
Platelets: 284 10*3/uL (ref 150–400)
RBC: 2.53 MIL/uL — AB (ref 3.87–5.11)
RDW: 15.9 % — ABNORMAL HIGH (ref 11.5–15.5)
WBC: 16.7 10*3/uL — ABNORMAL HIGH (ref 4.0–10.5)

## 2014-07-24 LAB — MAGNESIUM: Magnesium: 2.2 mg/dL (ref 1.5–2.5)

## 2014-07-24 MED ORDER — WHITE PETROLATUM GEL
Status: AC
  Administered 2014-07-24: 0.2
  Filled 2014-07-24: qty 5

## 2014-07-24 MED ORDER — VANCOMYCIN HCL 10 G IV SOLR
1250.0000 mg | Freq: Once | INTRAVENOUS | Status: AC
  Administered 2014-07-24: 1250 mg via INTRAVENOUS
  Filled 2014-07-24: qty 1250

## 2014-07-24 MED ORDER — CLONAZEPAM 0.5 MG PO TABS
1.0000 mg | ORAL_TABLET | Freq: Three times a day (TID) | ORAL | Status: DC
  Administered 2014-07-24 – 2014-07-27 (×9): 1 mg via ORAL
  Filled 2014-07-24 (×9): qty 2

## 2014-07-24 MED ORDER — CLONAZEPAM 0.1 MG/ML ORAL SUSPENSION
1.0000 mg | Freq: Three times a day (TID) | ORAL | Status: DC
  Filled 2014-07-24 (×3): qty 10

## 2014-07-24 MED ORDER — PIPERACILLIN-TAZOBACTAM 3.375 G IVPB
3.3750 g | Freq: Three times a day (TID) | INTRAVENOUS | Status: DC
  Administered 2014-07-24 – 2014-07-26 (×6): 3.375 g via INTRAVENOUS
  Filled 2014-07-24 (×6): qty 50

## 2014-07-24 MED ORDER — VANCOMYCIN HCL IN DEXTROSE 750-5 MG/150ML-% IV SOLN
750.0000 mg | Freq: Two times a day (BID) | INTRAVENOUS | Status: DC
  Administered 2014-07-25: 750 mg via INTRAVENOUS
  Filled 2014-07-24 (×2): qty 150

## 2014-07-24 NOTE — Progress Notes (Signed)
PULMONARY / CRITICAL CARE MEDICINE   Name: Elizabeth [name] MRN: [id number] DOB: 1956-08-04    ADMISSION DATE:  07/20/2014  REFERRING MD :  Ranae Palms, ED  CHIEF COMPLAINT:  resp distress  INITIAL PRESENTATION: 3rd  Admission in last 2 months for 58 year old with severe COPD on home oxygen but acute respiratory failure due to COPD exacerbation complicated by bilateral pneumothoraces and mechanical ventilation  STUDIES:   SIGNIFICANT EVENTS: 9/23 Bl chest tubes in ED 9/24 failed SBT > tachycardic, diaphoretic 9/25 failed SBT again 9/26 self extubated and re 9/27 failed    SUBJECTIVE/OVERNIGHT/INTERVAL HX 07/24/14: failed sbt. Subcut air better.    VITAL SIGNS: Temp:  [99.2 F (37.3 C)-100.8 F (38.2 C)] 99.5 F (37.5 C) (09/27 1400) Pulse Rate:  [111-143] 117 (09/27 1400) Resp:  [11-23] 11 (09/27 1400) BP: (77-160)/(48-134) 126/82 mmHg (09/27 1400) SpO2:  [96 %-100 %] 98 % (09/27 1400) FiO2 (%):  [30 %-40 %] 30 % (09/27 1400) Weight:  [68.3 kg (150 lb 9.2 oz)] 68.3 kg (150 lb 9.2 oz) (09/27 0400) HEMODYNAMICS: CVP:  [13 mmHg-28 mmHg] 22 mmHg VENTILATOR SETTINGS: Vent Mode:  [-] PRVC FiO2 (%):  [30 %-40 %] 30 % Set Rate:  [12 bmp-14 bmp] 12 bmp Vt Set:  [500 mL] 500 mL PEEP:  [5 cmH20] 5 cmH20 Plateau Pressure:  [20 cmH20-25 cmH20] 20 cmH20 INTAKE / OUTPUT:  Intake/Output Summary (Last 24 hours) at 07/24/14 1438 Last data filed at 07/24/14 1400  Gross per 24 hour  Intake   2994 ml  Output   1280 ml  Net   1714 ml    PHYSICAL EXAMINATION: Gen: anxious on vent, still w/ marked [place] edema but improving  HEENT: NCAT, EOMi, ETT in place PULM: Poor air movement, dist faint wheeze  CV: Tachy, regular, no mgr, no JVD AB: BS+, soft, nontender, Ext: warm, no edema, no clubbing, no cyanosis Derm: palpable crepitance from face to knees; improved on 07/24/14 Neuro: awake and alert on vent, anxious   LABS: PULMONARY  Recent Labs Lab 07/21/14 0335  PHART 7.364   PCO2ART 67.6*  PO2ART 145.0*  HCO3 37.2*  TCO2 39.3  O2SAT 98.9    CBC  Recent Labs Lab 07/22/14 1512 07/23/14 0340 07/24/14 0412  HGB 8.2* 8.0* 7.5*  HCT 26.3* 25.8* 23.9*  WBC 16.0* 16.1* 16.7*  PLT 272 251 284    COAGULATION No results found for this basename: INR,  in the last 168 hours  CARDIAC    Recent Labs Lab 07/20/14 0530 07/20/14 1123 07/20/14 1310 07/20/14 1906  TROPONINI <0.30 <0.30 <0.30 <0.30   No results found for this basename: PROBNP,  in the last 168 hours   CHEMISTRY  Recent Labs Lab 07/20/14 0554  07/21/14 0400 07/21/14 1220 07/22/14 0100 07/22/14 1040 07/23/14 0040 07/23/14 0340 07/24/14 0412  NA 141  --  139  --   --  140  --  140 140  K 3.9  --  4.5  --   --  4.3  --  4.0 3.9  CL 90*  --  96  --   --  100  --  102 100  CO2 45*  --  38*  --   --  31  --  28 31  GLUCOSE 135*  --  135*  --   --  206*  --  146* 121*  BUN 11  --  15  --   --  15  --  15 14  CREATININE 0.47*  --  0.50  --   --  0.41*  --  0.54 0.51  CALCIUM 9.3  --  8.4  --   --  7.8*  --  7.6* 8.4  MG  --   < > 2.2 2.2 2.4 2.4 2.3  --  2.2  PHOS  --   < > 2.1* 2.4 1.9* 1.6* 2.1*  --  3.1  < > = values in this interval not displayed. Estimated Creatinine Clearance: 76.4 ml/min (by C-G formula based on Cr of 0.51).   LIVER  Recent Labs Lab 07/20/14 0554 07/24/14 0412  AST 16 17  ALT 18 29  ALKPHOS 54 37*  BILITOT 0.6 0.8  PROT 6.7 4.9*  ALBUMIN 3.7 2.5*     INFECTIOUS  Recent Labs Lab 07/20/14 0557 07/23/14 0040  LATICACIDVEN 0.67 0.9     ENDOCRINE CBG (last 3)   Recent Labs  07/23/14 2357 07/24/14 0410 07/24/14 0726  GLUCAP 144* 126* 110*         IMAGING x48h Dg Chest Port 1 View  07/23/2014   CLINICAL DATA:  Check endotracheal tube and chest tubes.  EXAM: PORTABLE CHEST - 1 VIEW  COMPARISON:  07/22/2014  FINDINGS: Endotracheal tube tip measures 4.7 cm above the carinal. Enteric tube is not visualized and may have been  removed. Left central venous catheter tip is over the left upper mediastinum, overlying the junction of the brachiocephalic vein and SVC. Bilateral chest tubes are unchanged in position. Extensive subcutaneous emphysema is again demonstrated. Increasing atelectasis or infiltration is seen in the right lung base. Probable small residual pneumothoraces in the bases of both lungs. Pneumomediastinum is less prominent. No blunting of costophrenic angles.  IMPRESSION: Enteric tube appears to have been removed. Appliances are otherwise unchanged in position. Extensive subcutaneous emphysema is again demonstrated with probable small basilar pneumothoraces bilaterally. Developing atelectasis or infiltration in the right lung base. Pneumomediastinum is less pronounced.   Electronically Signed   By: Burman Nieves M.D.   On: 07/23/2014 06:13   Dg Abd Portable 1v  07/23/2014   CLINICAL DATA:  OG tube placement  EXAM: PORTABLE ABDOMEN - 1 VIEW  COMPARISON:  None.  FINDINGS: OG projecting over the stomach. There is no bowel dilatation to suggest obstruction. Soft tissue emphysema is noted. There are no pathologic calcifications along the expected course of the ureters.The osseous structures are unremarkable.  IMPRESSION: OG projecting over the stomach.  Soft tissue emphysema is noted.   Electronically Signed   By: Elige Ko   On: 07/23/2014 21:48   Dg Abd Portable 1v  07/23/2014   CLINICAL DATA:  OG tube placement.  EXAM: PORTABLE ABDOMEN - 1 VIEW  COMPARISON:  Chest x-ray 07/23/2014  FINDINGS: Enteric tube is present with side port in the region of the gastroesophageal junction and tip not well-defined but likely over the stomach in the midline upper abdomen. Bowel gas pattern is notable for moderate fecal retention throughout the colon. There is a somewhat diffuse mottled appearance of lucency over the abdomen likely represent patient's known subcutaneous air. There are surgical clips over the right upper quadrant.  There is a small caliber catheter projected over the midline pelvis likely within the rectum. Remainder the exam is unremarkable.  IMPRESSION: Mild moderate fecal retention without evidence of obstruction.  Enteric tube with tip in the region of the stomach in the upper mid abdomen and side-port over the region of the gastroesophageal junction.  Subcutaneous air.   Electronically Signed   By:  Elberta Fortis M.D.   On: 07/23/2014 09:59    Bilateral CT in good position. No clear infiltrates. No PTX. 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 Not ready to wean yet. Continues to fail weaning trials   P:   Continue bilateral chest tubes to suction for now.  Daily CXR Continue full vent support,   continue solumedrol, weaned to once daily Duonebs q 4h w/ Pulmicort and brovana See ID section re: widened abx Will adjust anxiolytics   NEUROLOGIC A:  Pain Severe anxiety Fentanyl suppressing respiratory drive P:   RASS goal: -1 Use fentanyl for air hunger and pain Added clonazepam scheduled for anxiety, will adjust up  Cont low dose versed   CARDIOVASCULAR  A: Hypertension P:  Continue metoprolol  RENAL A:  No acute issues P:   Trend chemistry   GASTROINTESTINAL A:   No issues P:   Continue tube feedings  HEMATOLOGIC A:  No issues P:  Monitor WBC count  INFECTIOUS A:   Rising WBC and fever curve  P:   9/23 Levaquin empirically for AE COPD >> plan 7 days Blood culture 9/25 > resp culture 9/25 > No orgs  vanc 9/27>>> Zosyn 9/27>> Add PCT algorythm.   ENDOCRINE A:  Not a known diabetic   P:   SSI    TODAY'S STAFF MD SUMMARY: severe, end stage emphysema with multiple recurrent  admissions, now with bilateral pneumothoraces.  Will be difficult to extubate.  Sister updated at bedside 9/26.  Failing SBT trials. Now w/ new fevers. Will widen abx. Worried that coming off the vent will be difficult. Rest per NP    Anders Simmonds ACNP 07/24/2014 2:38 PM    CCM time x 31 minutes   Dr. Kalman Shan, M.D., [facility].C.P Pulmonary and Critical Care Medicine Staff Physician  System [place] [facility] Pager: [phone], If no answer or between  15:00h - 7:00h: call [phone]  07/24/2014 4:16 PM

## 2014-07-24 NOTE — Progress Notes (Signed)
ANTIBIOTIC CONSULT NOTE - INITIAL  Pharmacy Consult for Vancomycin, Zosyn Indication: rule out pneumonia  Allergies  Allergen Reactions  . Bactrim Ds [Sulfamethoxazole-Trimethoprim] Hives  . Codeine Nausea And Vomiting  . Morphine Nausea And Vomiting  . Demerol [Meperidine] Other (See Comments)    Patient Measurements: Height:  (170.2 cm) Weight: 150 lb 9.2 oz (68.3 kg) IBW/kg (Calculated) : 61.6  Vital Signs: Temp: 99.5 F (37.5 C) (09/27 1400) Temp src: Core (Comment) (09/27 0600) BP: 126/82 mmHg (09/27 1400) Pulse Rate: 117 (09/27 1400) Intake/Output from previous day: 09/26 0701 - 09/27 0700 In: 2592 [I.V.:1645; NG/GT:545; IV Piggyback:402] Out: 1060 [Urine:1060] Intake/Output from this shift: Total I/O In: 1090 [I.V.:590; NG/GT:350; IV Piggyback:150] Out: 220 [Urine:220]  Labs:  Recent Labs  07/22/14 1040 07/22/14 1512 07/23/14 0340 07/24/14 0412  WBC 16.3* 16.0* 16.1* 16.7*  HGB 8.1* 8.2* 8.0* 7.5*  PLT 258 272 251 284  CREATININE 0.41*  --  0.54 0.51   Estimated Creatinine Clearance: 76.4 ml/min (by C-G formula based on Cr of 0.51). No results found for this basename: VANCOTROUGH, Leodis Binet, VANCORANDOM, GENTTROUGH, GENTPEAK, GENTRANDOM, TOBRATROUGH, TOBRAPEAK, TOBRARND, AMIKACINPEAK, AMIKACINTROU, AMIKACIN,  in the last 72 hours   Microbiology: Recent Results (from the past 720 hour(s))  MRSA PCR SCREENING     Status: None   Collection Time    07/20/14  8:58 AM      Result Value Ref Range Status   MRSA by PCR NEGATIVE  NEGATIVE Final   Comment:            The GeneXpert MRSA Assay (FDA     approved for NASAL specimens     only), is one component of a     comprehensive MRSA colonization     surveillance program. It is not     intended to diagnose MRSA     infection nor to guide or     monitor treatment for     MRSA infections.  CULTURE, RESPIRATORY (NON-EXPECTORATED)     Status: None   Collection Time    07/22/14  8:54 AM      Result  Value Ref Range Status   Specimen Description TRACHEAL ASPIRATE   Final   Special Requests Normal   Final   Gram Stain     Final   Value: MODERATE WBC PRESENT,BOTH PMN AND MONONUCLEAR     NO SQUAMOUS EPITHELIAL CELLS SEEN     NO ORGANISMS SEEN     Performed at [facility]   Culture     Final   Value: Non-Pathogenic Oropharyngeal-type [place] Isolated.     Performed at [facility]   Report Status 07/24/2014 FINAL   Final  CULTURE, BLOOD (ROUTINE X 2)     Status: None   Collection Time    07/22/14 10:40 AM      Result Value Ref Range Status   Specimen Description BLOOD LEFT ANTECUBITAL   Final   Special Requests BOTTLES DRAWN AEROBIC AND ANAEROBIC 5CC   Final   Culture  Setup Time     Final   Value: 07/22/2014 14:23     Performed at [facility]   Culture     Final   Value:        BLOOD CULTURE RECEIVED NO GROWTH TO DATE CULTURE WILL BE HELD FOR 5 DAYS BEFORE ISSUING A FINAL NEGATIVE REPORT     Performed at [facility]   Report Status PENDING   Incomplete  CULTURE, BLOOD (  ROUTINE X 2)     Status: None   Collection Time    07/22/14 10:48 AM      Result Value Ref Range Status   Specimen Description BLOOD LEFT ARM   Final   Special Requests BOTTLES DRAWN AEROBIC ONLY 3CC   Final   Culture  Setup Time     Final   Value: 07/22/2014 14:23     Performed at [facility]   Culture     Final   Value:        BLOOD CULTURE RECEIVED NO GROWTH TO DATE CULTURE WILL BE HELD FOR 5 DAYS BEFORE ISSUING A FINAL NEGATIVE REPORT     Performed at [facility]   Report Status PENDING   Incomplete    Medical History: Past Medical History  Diagnosis Date  . COPD (chronic obstructive pulmonary disease)   . Hypertension   . Acute respiratory failure   . GERD (gastroesophageal reflux disease)   . Hiatal hernia   . Hyperlipidemia   . Shortness of breath    Assessment: 41 YOM with persistent Tmax and WBC trending up, diaphoretic, and concern for  HCAP despite respiratory culture from 9/25 being negative. PCT pending.   Goal of Therapy:  Vancomycin trough level 15-20 mcg/ml  Plan:  1. Vancomycin , then  IV q12h. 2. Zosyn 3.375g IV q8h - each dose over 4 hours. 3. Stop Levaquin. 4. Monitor renal function, clinical status, cultures, and PCT.   Link Snuffer, PharmD, BCPS Clinical Pharmacist [phone] 07/24/2014,2:41 PM

## 2014-07-25 ENCOUNTER — Inpatient Hospital Stay (HOSPITAL_COMMUNITY): Payer: BC Managed Care – PPO

## 2014-07-25 DIAGNOSIS — R079 Chest pain, unspecified: Secondary | ICD-10-CM

## 2014-07-25 LAB — MAGNESIUM: Magnesium: 2.1 mg/dL (ref 1.5–2.5)

## 2014-07-25 LAB — CBC
HCT: 23.2 % — ABNORMAL LOW (ref 36.0–46.0)
HEMOGLOBIN: 7.3 g/dL — AB (ref 12.0–15.0)
MCH: 29.8 pg (ref 26.0–34.0)
MCHC: 31.5 g/dL (ref 30.0–36.0)
MCV: 94.7 fL (ref 78.0–100.0)
Platelets: 280 10*3/uL (ref 150–400)
RBC: 2.45 MIL/uL — AB (ref 3.87–5.11)
RDW: 16 % — ABNORMAL HIGH (ref 11.5–15.5)
WBC: 15.5 10*3/uL — ABNORMAL HIGH (ref 4.0–10.5)

## 2014-07-25 LAB — BLOOD GAS, ARTERIAL
Acid-Base Excess: 4.8 mmol/L — ABNORMAL HIGH (ref 0.0–2.0)
Bicarbonate: 29 mEq/L — ABNORMAL HIGH (ref 20.0–24.0)
DRAWN BY: 13898
FIO2: 0.3 %
MECHVT: 500 mL
O2 Saturation: 97.7 %
PCO2 ART: 45.3 mmHg — AB (ref 35.0–45.0)
PEEP: 5 cmH2O
PH ART: 7.423 (ref 7.350–7.450)
PO2 ART: 91.4 mmHg (ref 80.0–100.0)
Patient temperature: 98.6
RATE: 12 resp/min
TCO2: 30.4 mmol/L (ref 0–100)

## 2014-07-25 LAB — COMPREHENSIVE METABOLIC PANEL
ALT: 27 U/L (ref 0–35)
ANION GAP: 8 (ref 5–15)
AST: 16 U/L (ref 0–37)
Albumin: 2.5 g/dL — ABNORMAL LOW (ref 3.5–5.2)
Alkaline Phosphatase: 37 U/L — ABNORMAL LOW (ref 39–117)
BILIRUBIN TOTAL: 0.8 mg/dL (ref 0.3–1.2)
BUN: 16 mg/dL (ref 6–23)
CHLORIDE: 101 meq/L (ref 96–112)
CO2: 30 meq/L (ref 19–32)
CREATININE: 0.51 mg/dL (ref 0.50–1.10)
Calcium: 8.4 mg/dL (ref 8.4–10.5)
GFR calc Af Amer: >90 mL/min (ref >90)
GLUCOSE: 113 mg/dL — AB (ref 70–99)
Potassium: 4.4 mEq/L (ref 3.7–5.3)
Sodium: 139 mEq/L (ref 137–147)
Total Protein: 5 g/dL — ABNORMAL LOW (ref 6.0–8.3)

## 2014-07-25 LAB — GLUCOSE, CAPILLARY
GLUCOSE-CAPILLARY: 103 mg/dL — AB (ref 70–99)
GLUCOSE-CAPILLARY: 151 mg/dL — AB (ref 70–99)
GLUCOSE-CAPILLARY: 157 mg/dL — AB (ref 70–99)
Glucose-Capillary: 112 mg/dL — ABNORMAL HIGH (ref 70–99)
Glucose-Capillary: 119 mg/dL — ABNORMAL HIGH (ref 70–99)
Glucose-Capillary: <10 mg/dL — CL (ref 70–99)
Glucose-Capillary: 148 mg/dL — ABNORMAL HIGH (ref 70–99)
Glucose-Capillary: 151 mg/dL — ABNORMAL HIGH (ref 70–99)
Glucose-Capillary: 119 mg/dL — ABNORMAL HIGH (ref 70–99)

## 2014-07-25 LAB — PROCALCITONIN: Procalcitonin: <0.1 ng/mL

## 2014-07-25 LAB — PHOSPHORUS: Phosphorus: 3.7 mg/dL (ref 2.3–4.6)

## 2014-07-25 MED ORDER — PANTOPRAZOLE SODIUM 40 MG PO PACK
40.0000 mg | PACK | Freq: Every day | ORAL | Status: DC
  Administered 2014-07-25 – 2014-08-08 (×15): 40 mg
  Filled 2014-07-25 (×15): qty 20

## 2014-07-25 MED ORDER — PROMETHAZINE HCL 25 MG/ML IJ SOLN
12.5000 mg | Freq: Once | INTRAMUSCULAR | Status: AC
  Administered 2014-07-25: 12.5 mg via INTRAVENOUS
  Filled 2014-07-25: qty 1

## 2014-07-25 MED ORDER — METHYLPREDNISOLONE SODIUM SUCC 125 MG IJ SOLR
60.0000 mg | Freq: Two times a day (BID) | INTRAMUSCULAR | Status: DC
  Administered 2014-07-25 – 2014-07-27 (×4): 60 mg via INTRAVENOUS
  Filled 2014-07-25 (×5): qty 0.96

## 2014-07-25 NOTE — Progress Notes (Addendum)
PULMONARY / CRITICAL CARE MEDICINE   Name: Elizabeth [name] MRN: [id number] DOB: 1956-10-05    ADMISSION DATE:  07/20/2014  REFERRING MD :  Ranae Palms, ED  CHIEF COMPLAINT:  resp distress  INITIAL PRESENTATION: 3rd  Admission in last 2 months for 58 year old with severe COPD on home oxygen but acute respiratory failure due to COPD exacerbation complicated by bilateral pneumothoraces and mechanical ventilation  STUDIES:   SIGNIFICANT EVENTS: 9/23 Bl chest tubes in ED 9/24 failed SBT > tachycardic, diaphoretic 9/25 failed SBT again 9/26 self extubated and re tubed   SUBJECTIVE/OVERNIGHT/INTERVAL HX Failed weaning, RR up   VITAL SIGNS: Temp:  [98.1 F (36.7 C)-100.3 F (37.9 C)] 99.8 F (37.7 C) (09/28 1000) Pulse Rate:  [89-136] 89 (09/28 1102) Resp:  [10-24] 16 (09/28 1102) BP: (90-157)/(51-93) 103/84 mmHg (09/28 1102) SpO2:  [92 %-100 %] 100 % (09/28 1102) FiO2 (%):  [30 %] 30 % (09/28 1102) Weight:  [71.7 kg (158 lb 1.1 oz)] 71.7 kg (158 lb 1.1 oz) (09/28 0400) HEMODYNAMICS: CVP:  [12 mmHg-27 mmHg] 13 mmHg VENTILATOR SETTINGS: Vent Mode:  [-] PRVC FiO2 (%):  [30 %] 30 % Set Rate:  [12 bmp] 12 bmp Vt Set:  [500 mL] 500 mL PEEP:  [5 cmH20] 5 cmH20 Plateau Pressure:  [20 cmH20-26 cmH20] 25 cmH20 INTAKE / OUTPUT:  Intake/Output Summary (Last 24 hours) at 07/25/14 1335 Last data filed at 07/25/14 1000  Gross per 24 hour  Intake 3257.5 ml  Output   1155 ml  Net 2102.5 ml    PHYSICAL EXAMINATION: Gen: anxious on vent, still w/ marked [place] edema but improving  HEENT: jvd wnl PULM: BS distant  CV: Tachy, regular, no mgr, no JVD AB: BS+, soft, nontender, Ext: warm, no edema, no clubbing, no cyanosis Derm: palpable crepitance from face to knees; improved on 07/24/14 Neuro: awake and alert on vent, anxious   LABS: PULMONARY  Recent Labs Lab 07/21/14 0335 07/25/14 0500  PHART 7.364 7.423  PCO2ART 67.6* 45.3*  PO2ART 145.0* 91.4  HCO3 37.2* 29.0*  TCO2 39.3  30.4  O2SAT 98.9 97.7    CBC  Recent Labs Lab 07/23/14 0340 07/24/14 0412 07/25/14 0400  HGB 8.0* 7.5* 7.3*  HCT 25.8* 23.9* 23.2*  WBC 16.1* 16.7* 15.5*  PLT 251 284 280    COAGULATION No results found for this basename: INR,  in the last 168 hours  CARDIAC    Recent Labs Lab 07/20/14 0530 07/20/14 1123 07/20/14 1310 07/20/14 1906  TROPONINI <0.30 <0.30 <0.30 <0.30   No results found for this basename: PROBNP,  in the last 168 hours   CHEMISTRY  Recent Labs Lab 07/21/14 0400  07/22/14 0100 07/22/14 1040 07/23/14 0040 07/23/14 0340 07/24/14 0412 07/25/14 0400  NA 139  --   --  140  --  140 140 139  K 4.5  --   --  4.3  --  4.0 3.9 4.4  CL 96  --   --  100  --  102 100 101  CO2 38*  --   --  31  --  GLUCOSE 135*  --   --  206*  --  146* 121* 113*  BUN 15  --   --  15  --  CREATININE 0.50  --   --  0.41*  --  0.54 0.51 0.51  CALCIUM 8.4  --   --  7.8*  --  7.6* 8.4 8.4  MG  2.2  < > 2.4 2.4 2.3  --  2.2 2.1  PHOS 2.1*  < > 1.9* 1.6* 2.1*  --  3.1 3.7  < > = values in this interval not displayed. Estimated Creatinine Clearance: 76.4 ml/min (by C-G formula based on Cr of 0.51).   LIVER  Recent Labs Lab 07/20/14 0554 07/24/14 0412 07/25/14 0400  AST ALT ALKPHOS 54 37* 37*  BILITOT 0.6 0.8 0.8  PROT 6.7 4.9* 5.0*  ALBUMIN 3.7 2.5* 2.5*     INFECTIOUS  Recent Labs Lab 07/20/14 0557 07/23/14 0040  LATICACIDVEN 0.67 0.9     ENDOCRINE CBG (last 3)   Recent Labs  07/25/14 0405 07/25/14 0816 07/25/14 1213  GLUCAP 112* 103* 119*         IMAGING x48h Dg Chest Port 1 View  07/24/2014   CLINICAL DATA:  Evaluate endotracheal tube, history COPD, hypertension  EXAM: PORTABLE CHEST - 1 VIEW  COMPARISON:  Portable exam 0601 hr compared 07/23/2014  FINDINGS: Tip of endotracheal tube projects 6.0 cm above carina.  Nasogastric tube extends into into stomach.  BILATERAL thoracostomy tubes at upper  lobes.  LEFT jugular central venous catheter tip projects over proximal SVC.  Normal heart size and mediastinal contours.  Severe emphysematous changes with minimal atelectasis at bases.  Significant superimposed chest wall emphysema extending into the cervical regions.  No definite pulmonary infiltrate or pleural effusion.  No gross pneumothorax identified.  IMPRESSION: Extensive subcutaneous and chest wall emphysema.  Severe COPD changes without acute infiltrate.   Electronically Signed   By: Ulyses Southward M.D.   On: 07/24/2014 08:10    Bilateral CT in good position. No clear infiltrates. No PTX. 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, end stage Bilateral pneumothorax > 9/25 no air leak on R, small on L  Acute exacerbation of COPD Self extubated 9/26  P:   Wean ps with higher levels needed likely pcxr in am  CT to suction Even balance goals Avoid alkaloids, may need reduction MV Increase solumedral  NEUROLOGIC A:  Pain Severe anxiety Fentanyl suppressing respiratory drive P:   RASS goal: -1 Use fentanyl for air hunger and pain Added clonazepam scheduled for anxiety, will adjust up  Cont low dose versed May consider precedex re attempts   CARDIOVASCULAR  A: Hypertension P:  Continue metoprolol, may need to consider bisoprolol if weaning failure continue and distant  RENAL A:  No acute issues P:   Trend chemistry  Even balance goals  GASTROINTESTINAL A:   Vital TF P:   Continue tube feedings  HEMATOLOGIC A:  Anemia P:  Monitor WBC count Even goals  INFECTIOUS A:   Rising WBC and fever curve  P:   9/23 Levaquin empirically for AE COPD >> plan 7 days Blood culture 9/25 > resp culture 9/25 > No orgs  vanc 9/27>>>9/28 Zosyn 9/27>> Add PCT algorithm to help reduce abx exposure  ENDOCRINE A:  Not a known diabetic   P:   SSI  TODAY'S STAFF MD  SUMMARY: wean attempts ps increase needed, may need to dc BB, CT to suction, pcxr in am    CCM time x 30 minutes  Mcarthur Rossetti. Tyson Alias, MD, FACP Pgr: [phone] [facility]

## 2014-07-25 NOTE — Progress Notes (Signed)
eLink Physician-Brief Progress Note Patient Name: LIARA HOLM DOB: 07/22/1956 MRN: [id number]   Date of Service  07/25/2014  HPI/Events of Note    eICU Interventions  Phenergan x 1 for nausea     Intervention Category Minor Interventions: Other:;Routine modifications to care plan (e.g. PRN medications for pain, fever)  Gladis Soley S. 07/25/2014, 10:05 PM

## 2014-07-25 NOTE — Progress Notes (Signed)
Attempted to wean pt with PS levels of 14-16. Placed back on full support due to pt not breathing. RR 6 or less for 30 minutes.  Jacqulynn Cadet RRT

## 2014-07-26 ENCOUNTER — Inpatient Hospital Stay (HOSPITAL_COMMUNITY): Payer: BC Managed Care – PPO

## 2014-07-26 LAB — PROCALCITONIN

## 2014-07-26 LAB — GLUCOSE, CAPILLARY
GLUCOSE-CAPILLARY: 120 mg/dL — AB (ref 70–99)
GLUCOSE-CAPILLARY: 142 mg/dL — AB (ref 70–99)
Glucose-Capillary: 120 mg/dL — ABNORMAL HIGH (ref 70–99)
Glucose-Capillary: 132 mg/dL — ABNORMAL HIGH (ref 70–99)
Glucose-Capillary: 133 mg/dL — ABNORMAL HIGH (ref 70–99)
Glucose-Capillary: 138 mg/dL — ABNORMAL HIGH (ref 70–99)

## 2014-07-26 LAB — MAGNESIUM: Magnesium: 2.3 mg/dL (ref 1.5–2.5)

## 2014-07-26 LAB — PHOSPHORUS: Phosphorus: 4.4 mg/dL (ref 2.3–4.6)

## 2014-07-26 MED ORDER — DOCUSATE SODIUM 50 MG/5ML PO LIQD
100.0000 mg | Freq: Two times a day (BID) | ORAL | Status: DC
  Administered 2014-07-26 – 2014-07-27 (×3): 100 mg via ORAL
  Filled 2014-07-26 (×5): qty 10

## 2014-07-26 MED ORDER — VITAL AF 1.2 CAL PO LIQD
1000.0000 mL | ORAL | Status: DC
  Administered 2014-07-26 – 2014-08-15 (×15): 1000 mL
  Filled 2014-07-26 (×30): qty 1000

## 2014-07-26 MED ORDER — FUROSEMIDE 10 MG/ML IJ SOLN
20.0000 mg | Freq: Two times a day (BID) | INTRAMUSCULAR | Status: DC
  Administered 2014-07-26 – 2014-07-27 (×3): 20 mg via INTRAVENOUS
  Filled 2014-07-26 (×4): qty 2

## 2014-07-26 NOTE — Progress Notes (Signed)
PULMONARY / CRITICAL CARE MEDICINE   Name: Elizabeth [name] MRN: [id number] DOB: 1956-01-05    ADMISSION DATE:  07/20/2014  REFERRING MD :  Ranae Palms, ED  CHIEF COMPLAINT:  resp distress  INITIAL PRESENTATION: 3rd  Admission in last 2 months for 58 year old with severe COPD on home oxygen but acute respiratory failure due to COPD exacerbation complicated by bilateral pneumothoraces and mechanical ventilation  STUDIES:   SIGNIFICANT EVENTS: 9/23 Bl chest tubes in ED 9/24 failed SBT > tachycardic, diaphoretic 9/25 failed SBT again 9/26 self extubated and re tubed 9/28- some weaning high PS  SUBJECTIVE/OVERNIGHT/INTERVAL HX anxiety   VITAL SIGNS: Temp:  [99.1 F (37.3 C)-100.1 F (37.8 C)] 99.4 F (37.4 C) (09/29 0600) Pulse Rate:  [68-136] 94 (09/29 0600) Resp:  [9-21] 12 (09/29 0600) BP: (87-157)/(50-104) 120/65 mmHg (09/29 0600) SpO2:  [92 %-100 %] 97 % (09/29 0600) FiO2 (%):  [30 %] 30 % (09/29 0400) Weight:  [71.2 kg (156 lb 15.5 oz)] 71.2 kg (156 lb 15.5 oz) (09/29 0403) HEMODYNAMICS: CVP:  [6 mmHg-22 mmHg] 9 mmHg VENTILATOR SETTINGS: Vent Mode:  [-] PRVC FiO2 (%):  [30 %] 30 % Set Rate:  [12 bmp] 12 bmp Vt Set:  [500 mL] 500 mL PEEP:  [5 cmH20] 5 cmH20 Pressure Support:  [14 cmH20] 14 cmH20 Plateau Pressure:  [20 cmH20-30 cmH20] 30 cmH20 INTAKE / OUTPUT:  Intake/Output Summary (Last 24 hours) at 07/26/14 0624 Last data filed at 07/26/14 0600  Gross per 24 hour  Intake   3611 ml  Output   2381 ml  Net   1230 ml    PHYSICAL EXAMINATION: Gen: anxious on vent, still w/ marked [place] edema  unchanged HEENT: jvd wnl PULM: BS distant, chest wall crepitus significant remains CV: s1 s2  regular AB: BS+, soft, nontender no r/g Ext: warm, no edema, crepitus Derm: palpable crepitance from face to knees; unchanged Neuro: awake and alert on vent, anxious, rass -1   LABS: PULMONARY  Recent Labs Lab 07/21/14 0335 07/25/14 0500  PHART 7.364 7.423  PCO2ART  67.6* 45.3*  PO2ART 145.0* 91.4  HCO3 37.2* 29.0*  TCO2 39.3 30.4  O2SAT 98.9 97.7    CBC  Recent Labs Lab 07/23/14 0340 07/24/14 0412 07/25/14 0400  HGB 8.0* 7.5* 7.3*  HCT 25.8* 23.9* 23.2*  WBC 16.1* 16.7* 15.5*  PLT 251 284 280    COAGULATION No results found for this basename: INR,  in the last 168 hours  CARDIAC    Recent Labs Lab 07/20/14 0530 07/20/14 1123 07/20/14 1310 07/20/14 1906  TROPONINI <0.30 <0.30 <0.30 <0.30   No results found for this basename: PROBNP,  in the last 168 hours   CHEMISTRY  Recent Labs Lab 07/21/14 0400  07/22/14 1040 07/23/14 0040 07/23/14 0340 07/24/14 0412 07/25/14 0400 07/26/14 0403  NA 139  --  140  --  140 140 139  --   K 4.5  --  4.3  --  4.0 3.9 4.4  --   CL 96  --  100  --  102 100 101  --   CO2 38*  --  31  --  28 31 30   --   GLUCOSE 135*  --  206*  --  146* 121* 113*  --   BUN 15  --  15  --  15 14 16   --   CREATININE 0.50  --  0.41*  --  0.54 0.51 0.51  --   CALCIUM 8.4  --  7.8*  --  7.6* 8.4 8.4  --   MG 2.2  < > 2.4 2.3  --  2.2 2.1 2.3  PHOS 2.1*  < > 1.6* 2.1*  --  3.1 3.7 4.4  < > = values in this interval not displayed. Estimated Creatinine Clearance: 76.4 ml/min (by C-G formula based on Cr of 0.51).   LIVER  Recent Labs Lab 07/20/14 0554 07/24/14 0412 07/25/14 0400  AST 16 17 16   ALT 18 29 27   ALKPHOS 54 37* 37*  BILITOT 0.6 0.8 0.8  PROT 6.7 4.9* 5.0*  ALBUMIN 3.7 2.5* 2.5*     INFECTIOUS  Recent Labs Lab 07/20/14 0557 07/23/14 0040 07/25/14 1343 07/26/14 0403  LATICACIDVEN 0.67 0.9  --   --   PROCALCITON  --   --  <0.10 <0.10     ENDOCRINE CBG (last 3)   Recent Labs  07/25/14 2031 07/26/14 0017 07/26/14 0357  GLUCAP 119* 133* 138*    IMAGING x48h Dg Chest Port 1 View  07/25/2014   CLINICAL DATA:  Bilateral chest tubes.  EXAM: PORTABLE CHEST - 1 VIEW  COMPARISON:  07/24/2014  FINDINGS: Bilateral chest tubes again project over the upper lobes. Severe diffuse  subcutaneous emphysema noted. No visible pneumothorax although a small pneumothoraces could be obscured by the subcutaneous emphysema. COPD changes within the lungs with hyperinflation. Heart is normal size. No definite confluent opacity or effusion.  IMPRESSION: Bilateral chest tubes without visible pneumothorax. Severe diffuse subcutaneous emphysema persists.  COPD.   Electronically Signed   By: Charlett NoseKevin  Dover M.D.   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, end stage Bilateral pneumothorax > 9/25 no air leak on R, small on L  Acute exacerbation of COPD Self extubated 9/26  P:   Wean again today PS 10, cpap5 , goal 1 hr, then reduce PS if able pcxr in am  CT to suction, will keep CT in until off Pos pressure May need lasix to neg balance Maintain solumedrol, likely will reduce if no improvement with weaning  NEUROLOGIC A:  Pain Severe anxiety P:   RASS goal: -1 Use fentanyl for air hunger and pain Lonazepam, may need increase, eval PRN versed needs Cont low dose versed Upright as able  CARDIOVASCULAR  A: Hypertension, pos balance, cvp up P:  Lasix addition tele  RENAL A:  Pos balance P:   Trend chemistry in am  Awaited BMET this am  Neg balance goals Lasix addition  GASTROINTESTINAL A:   Vital TF P:   Continue tube feedings ppi Follow for BM Add colace  HEMATOLOGIC A:  Anemia P:  Monitor WBC count Neg goals will hemoconcetrate  INFECTIOUS A:   Unimpressed infection P:   9/23 Levaquin empirically for AE COPD >> plan 7 days Blood culture 9/25 > resp culture 9/25 > No orgs  vanc 9/27>>>9/28 Zosyn 9/27>>9/29  Observe off abx  ENDOCRINE A:  Not a known diabetic   P:   SSI Steroids remain  TODAY'S STAFF MD SUMMARY: wean attempts ps 10, solumedral to remain, lasix start   CCM time x 30  minutes  Mcarthur Rossettianiel J. Tyson AliasFeinstein, MD, FACP Pgr: [phone] [facility]

## 2014-07-26 NOTE — Progress Notes (Signed)
NUTRITION FOLLOW UP  Intervention:   Increase Vital AF 1.2 goal rate to 55 ml/h (1320 ml per day) to provide 1584 kcals, 99 gm protein, 1071 ml free water daily.  Nutrition Dx:   Inadequate oral intake related to inability to eat as evidenced by NPO status, ongoing.  Goal:   Intake to meet >90% of estimated nutrition needs, met.  Monitor:   TF tolerance/adequacy, weight trend, labs, vent status.  Assessment:   3rd admission in last 2 months for 58 year old with severe COPD on home oxygen but acute respiratory failure due to COPD exacerbation complicated by bilateral pneumothoraces and mechanical ventilation.  Patient remains intubated on ventilator support MV: 9.5 L/min Temp (24hrs), Avg:99.3 F (37.4 C), Min:99.1 F (37.3 C), Max:99.8 F (37.7 C)   TF regimen: Vital AF 1.2 at 50 ml/h providing 1440 kcals, 90 gm protein, 973 ml free water daily. Meets 92% of re-estimated kcal needs and 100% of protein needs. Will adjust goal rate to better meet calorie and protein needs.   Height: Ht Readings from Last 1 Encounters:  07/20/14 5' 7"  (1.702 m)    Weight Status:   Wt Readings from Last 1 Encounters:  07/26/14 156 lb 15.5 oz (71.2 kg)  07/20/14  134 lb 0.6 oz (60.8 kg)   Re-estimated needs:  Kcal: 1564 Protein: 85-100 gm Fluid: 1.5 L  Skin: stage 1 pressure ulcer to sacrum  Diet Order: NPO   Intake/Output Summary (Last 24 hours) at 07/26/14 1420 Last data filed at 07/26/14 1400  Gross per 24 hour  Intake   2955 ml  Output   4146 ml  Net  -1191 ml    Last BM: unknown   Labs:   Recent Labs Lab 07/23/14 0340 07/24/14 0412 07/25/14 0400 07/26/14 0403  NA 140 140 139  --   K 4.0 3.9 4.4  --   CL 102 100 101  --   CO2 28 31 30   --   BUN 15 14 16   --   CREATININE 0.54 0.51 0.51  --   CALCIUM 7.6* 8.4 8.4  --   MG  --  2.2 2.1 2.3  PHOS  --  3.1 3.7 4.4  GLUCOSE 146* 121* 113*  --     CBG (last 3)   Recent Labs  07/26/14 0357 07/26/14 0811  07/26/14 1212  GLUCAP 138* 132* 120*    Scheduled Meds: . antiseptic oral rinse  7 mL Mouth Rinse QID  . arformoterol  15 mcg Nebulization BID  . budesonide  0.5 mg Nebulization BID  . chlorhexidine  15 mL Mouth Rinse BID  . clonazePAM  1 mg Oral Q8H  . docusate  100 mg Oral BID  . fentaNYL  50 mcg Intravenous Once  . furosemide  20 mg Intravenous Q12H  . heparin  5,000 Units Subcutaneous 3 times per day  . insulin aspart  2 Units Subcutaneous 6 times per day  . ipratropium-albuterol  3 mL Nebulization Q4H  . methylPREDNISolone (SOLU-MEDROL) injection  60 mg Intravenous Q12H  . metoprolol tartrate  12.5 mg Oral BID  . midazolam  5 mg Intravenous Once  . pantoprazole sodium  40 mg Per Tube Daily    Continuous Infusions: . feeding supplement (VITAL AF 1.2 CAL) 1,000 mL (07/24/14 2000)  . fentaNYL infusion INTRAVENOUS 75 mcg/hr (07/26/14 0800)  . midazolam (VERSED) infusion 2.5 mg/hr (07/26/14 0800)    Molli Barrows, RD, LDN, [place] Pager [phone] After Hours Pager [phone]

## 2014-07-27 ENCOUNTER — Inpatient Hospital Stay (HOSPITAL_COMMUNITY): Payer: BC Managed Care – PPO

## 2014-07-27 LAB — GLUCOSE, CAPILLARY
GLUCOSE-CAPILLARY: 116 mg/dL — AB (ref 70–99)
GLUCOSE-CAPILLARY: 132 mg/dL — AB (ref 70–99)
Glucose-Capillary: 114 mg/dL — ABNORMAL HIGH (ref 70–99)
Glucose-Capillary: 116 mg/dL — ABNORMAL HIGH (ref 70–99)
Glucose-Capillary: 134 mg/dL — ABNORMAL HIGH (ref 70–99)
Glucose-Capillary: 157 mg/dL — ABNORMAL HIGH (ref 70–99)

## 2014-07-27 LAB — CBC WITH DIFFERENTIAL/PLATELET
BASOS ABS: 0 10*3/uL (ref 0.0–0.1)
Basophils Relative: 0 % (ref 0–1)
EOS PCT: 0 % (ref 0–5)
Eosinophils Absolute: 0 10*3/uL (ref 0.0–0.7)
HEMATOCRIT: 22.7 % — AB (ref 36.0–46.0)
Hemoglobin: 7.1 g/dL — ABNORMAL LOW (ref 12.0–15.0)
Lymphocytes Relative: 12 % (ref 12–46)
Lymphs Abs: 1.8 10*3/uL (ref 0.7–4.0)
MCH: 30 pg (ref 26.0–34.0)
MCHC: 31.3 g/dL (ref 30.0–36.0)
MCV: 95.8 fL (ref 78.0–100.0)
Monocytes Absolute: 1 10*3/uL (ref 0.1–1.0)
Monocytes Relative: 7 % (ref 3–12)
Neutro Abs: 12.7 10*3/uL — ABNORMAL HIGH (ref 1.7–7.7)
Neutrophils Relative %: 81 % — ABNORMAL HIGH (ref 43–77)
PLATELETS: 331 10*3/uL (ref 150–400)
RBC: 2.37 MIL/uL — ABNORMAL LOW (ref 3.87–5.11)
RDW: 16.5 % — AB (ref 11.5–15.5)
WBC: 15.6 10*3/uL — AB (ref 4.0–10.5)

## 2014-07-27 LAB — BASIC METABOLIC PANEL
Anion gap: 8 (ref 5–15)
BUN: 24 mg/dL — ABNORMAL HIGH (ref 6–23)
CALCIUM: 8.3 mg/dL — AB (ref 8.4–10.5)
CO2: 35 meq/L — AB (ref 19–32)
CREATININE: 0.46 mg/dL — AB (ref 0.50–1.10)
Chloride: 103 mEq/L (ref 96–112)
GFR calc Af Amer: >90 mL/min (ref >90)
Glucose, Bld: 127 mg/dL — ABNORMAL HIGH (ref 70–99)
Potassium: 3.9 mEq/L (ref 3.7–5.3)
SODIUM: 146 meq/L (ref 137–147)

## 2014-07-27 LAB — MAGNESIUM: Magnesium: 2.3 mg/dL (ref 1.5–2.5)

## 2014-07-27 LAB — PROCALCITONIN: Procalcitonin: <0.1 ng/mL

## 2014-07-27 LAB — PHOSPHORUS: Phosphorus: 4.3 mg/dL (ref 2.3–4.6)

## 2014-07-27 MED ORDER — FUROSEMIDE 10 MG/ML IJ SOLN
20.0000 mg | Freq: Two times a day (BID) | INTRAMUSCULAR | Status: DC
  Administered 2014-07-27: 20 mg via INTRAVENOUS

## 2014-07-27 MED ORDER — SENNOSIDES-DOCUSATE SODIUM 8.6-50 MG PO TABS
1.0000 | ORAL_TABLET | Freq: Two times a day (BID) | ORAL | Status: DC
  Administered 2014-07-27 – 2014-08-18 (×34): 1 via ORAL
  Filled 2014-07-27 (×48): qty 1

## 2014-07-27 MED ORDER — METHYLPREDNISOLONE SODIUM SUCC 125 MG IJ SOLR
60.0000 mg | Freq: Every day | INTRAMUSCULAR | Status: DC
  Administered 2014-07-28: 60 mg via INTRAVENOUS
  Filled 2014-07-27: qty 2
  Filled 2014-07-27: qty 0.96

## 2014-07-27 MED ORDER — POLYETHYLENE GLYCOL 3350 17 G PO PACK
17.0000 g | PACK | Freq: Every day | ORAL | Status: DC
  Administered 2014-07-27 – 2014-08-18 (×15): 17 g via ORAL
  Filled 2014-07-27 (×23): qty 1

## 2014-07-27 NOTE — Progress Notes (Signed)
PULMONARY / CRITICAL CARE MEDICINE   Name: Elizabeth [name] MRN: [id number] DOB: [birth year]    ADMISSION DATE:  07/20/2014  REFERRING MD :  Ranae PalmsYelverton, ED  CHIEF COMPLAINT:  resp distress  INITIAL PRESENTATION: 58 y/o F with severe COPD on home O2, 3rd  admission in last 2 months who presented 9/23 with AECOPD and bilateral pneumothoraces requiring mechanical ventilation.   STUDIES:   SIGNIFICANT EVENTS: 9/23  Bl chest tubes in ED 9/24  Failed SBT > tachycardic, diaphoretic 9/25  Failed SBT again 9/26  Self extubated and re tubed 9/28  Some weaning high PS 9/30  CT's to -20 cm sxn, no air leak, failed SBT in am    SUBJECTIVE/OVERNIGHT/INTERVAL HX:  RN reports pt turned to wean 10/5 this am, reduced sedation (lightest in three days).  Shortly after wean she developed tachycardia, hypertensive and desaturation to 70's.  Noted hgb drift from 11 on admit to 7.1   VITAL SIGNS: Temp:  [98.1 F (36.7 C)-99.5 F (37.5 C)] 99.2 F (37.3 C) (09/30 1000) Pulse Rate:  [80-134] 122 (09/30 1000) Resp:  [11-30] 24 (09/30 1000) BP: (71-188)/(45-148) 126/72 mmHg (09/30 1000) SpO2:  [93 %-100 %] 93 % (09/30 1000) FiO2 (%):  [30 %] 30 % (09/30 0818) Weight:  [155 lb 13.8 oz (70.7 kg)] 155 lb 13.8 oz (70.7 kg) (09/30 0400)  HEMODYNAMICS: CVP:  [6 mmHg-17 mmHg] 6 mmHg  VENTILATOR SETTINGS: Vent Mode:  [-] PRVC FiO2 (%):  [30 %] 30 % Set Rate:  [12 bmp] 12 bmp Vt Set:  [500 mL] 500 mL PEEP:  [5 cmH20] 5 cmH20 Pressure Support:  [10 cmH20] 10 cmH20 Plateau Pressure:  [17 cmH20-24 cmH20] 22 cmH20  INTAKE / OUTPUT:  Intake/Output Summary (Last 24 hours) at 07/27/14 1104 Last data filed at 07/27/14 1000  Gross per 24 hour  Intake 1812.25 ml  Output   2600 ml  Net -787.75 ml    PHYSICAL EXAMINATION: Gen: anxious on vent, still w/ marked [place] air HEENT: jvd wnl PULM: BS distant, significant chest wall crepitus (down to feet) CV: s1 s2  regular AB: BS+, soft, nontender no r/g Ext:  warm, no edema, crepitus Neuro: awake and alert on vent, anxious, rass 0   LABS: PULMONARY  Recent Labs Lab 07/21/14 0335 07/25/14 0500  PHART 7.364 7.423  PCO2ART 67.6* 45.3*  PO2ART 145.0* 91.4  HCO3 37.2* 29.0*  TCO2 39.3 30.4  O2SAT 98.9 97.7   CBC  Recent Labs Lab 07/24/14 0412 07/25/14 0400 07/27/14 0458  HGB 7.5* 7.3* 7.1*  HCT 23.9* 23.2* 22.7*  WBC 16.7* 15.5* 15.6*  PLT 284 280 331   COAGULATION No results found for this basename: INR,  in the last 168 hours  CARDIAC  Recent Labs Lab 07/20/14 1123 07/20/14 1310 07/20/14 1906  TROPONINI <0.30 <0.30 <0.30   No results found for this basename: PROBNP,  in the last 168 hours  CHEMISTRY  Recent Labs Lab 07/22/14 1040 07/23/14 0040 07/23/14 0340 07/24/14 0412 07/25/14 0400 07/26/14 0403 07/27/14 0458  NA 140  --  140 140 139  --  146  K 4.3  --  4.0 3.9 4.4  --  3.9  CL 100  --  102 100 101  --  103  CO2 31  --  28 31 30   --  35*  GLUCOSE 206*  --  146* 121* 113*  --  127*  BUN 15  --  15 14 16   --  24*  CREATININE  0.41*  --  0.54 0.51 0.51  --  0.46*  CALCIUM 7.8*  --  7.6* 8.4 8.4  --  8.3*  MG 2.4 2.3  --  2.2 2.1 2.3 2.3  PHOS 1.6* 2.1*  --  3.1 3.7 4.4 4.3   Estimated Creatinine Clearance: 76.4 ml/min (by C-G formula based on Cr of 0.46).  LIVER  Recent Labs Lab 07/24/14 0412 07/25/14 0400  AST 17 16  ALT 29 27  ALKPHOS 37* 37*  BILITOT 0.8 0.8  PROT 4.9* 5.0*  ALBUMIN 2.5* 2.5*   INFECTIOUS  Recent Labs Lab 07/23/14 0040 07/25/14 1343 07/26/14 0403 07/27/14 0458  LATICACIDVEN 0.9  --   --   --   PROCALCITON  --  <0.10 <0.10 <0.10   ENDOCRINE CBG (last 3)   Recent Labs  07/26/14 2332 07/27/14 0350 07/27/14 0824  GLUCAP 134* 157* 116*    IMAGING x48h Dg Chest Port 1 View  07/26/2014   CLINICAL DATA:  Intubated.  Shortness of breath.  EXAM: PORTABLE CHEST - 1 VIEW  COMPARISON:  07/25/2014  FINDINGS: Endotracheal tube tip measures 6 cm above the carina.  Enteric tube tip is off the field of view but below the left hemidiaphragm consistent with location at least in the stomach. Bilateral chest tubes unchanged in position. Diffuse subcutaneous emphysema without change. No definite residual pneumothorax. Normal heart size and pulmonary vascularity. Emphysematous changes in the lungs with atelectasis in the lung bases. No focal consolidation in the lungs.  IMPRESSION: No significant change since previous study. Appliances appear unchanged in position. Extensive subcutaneous emphysema. No focal consolidation in the lungs.   Electronically Signed   By: Burman Nieves M.D.   On: 07/26/2014 06:07    ASSESSMENT / PLAN:  PULMONARY OETT 9/23 >> 9/26, 9/26 >>  A:  Acute respiratory failure with hypercarbia - in setting of AECOPD, self extubated 9/26.  Severe COPD - on home O2, bullous emphysema, end stage Bilateral pneumothorax - 9/25 no air leak on R, small on L  Acute exacerbation of COPD Clinical progress with neg balance P:   Continue daily SBT / WUA, consider repeat in afternoon Trend CXR for ptx and chest tubes CT to 20 cm suction, will keep CT in until off positive pressure Lasix as below  Solu-Medrol 60 mg Q12, reduce to 60 QD 9/30 Upright positioning  Continue Brovana, Pulmicort, Q4 duoneb Attempt repeat afternoon SBT May require early trach will d/w family  NEUROLOGIC A:   Pain Severe anxiety P:   RASS goal: -1 Use fentanyl for air hunger and pain Versed gtt, low dose, unable to dc this, does well with this  Dc klonopin ativan PTA  CARDIOVASCULAR  A:  Hypertension, pos balance, cvp up P:  Lasix 20 mg Q12 x4 hours, last dose 9/30 pm  Tele monitoring  Consider repeat lasix dosing in am 10/1  RENAL A:  Volume Overload - positive 11L balance since admission P:   Trend chemistry in am  Trend BMP  Goal negative balance, see lasix dosing as above, want same dose given  GASTROINTESTINAL A:   Ventilator Associated  Dysphagia  P:   Continue Vital TF  SUP:  PPI  Bowel Regimen: change to sen[place]ot, hold for diarrhea Add mylax  HEMATOLOGIC A:   Anemia - noted drift of hgb from 11 on admit to 7.1 9/30 P:  Trend CBC Lasix will add to hemoconcentration  Tx for Hgb < 7%, active bleeding or MI <8%  INFECTIOUS A:  Low Clinical Suspicion for Acute Infectious Process P:   Blood culture 9/25 >> resp culture 9/25 >> No orgs   Vanc 9/27>>>9/28 Zosyn 9/27>>9/29 Levaquin, empiric COPD 9/23 >> 9/27  Observe off abx Trend fever / WBC curve  ENDOCRINE A:   Mild Hyperglycemia - no hx DM   P:   SSI Steroids as above, redcue  TODAY'S STAFF MD SUMMARY: 58 y/o F admitted with AECOPD, acutely decompensated requiring intubation 9/23, developed bilateral pneumothoraces post intubation.  Self extubated 9/26.  Difficulty with weaning  Update: improved weaning  CCM time  Canary Brim, NP-C [facility] Pgr: [phone] or [phone]   I have fully examined this patient and agree with above findings.    And edite di nfull  Mcarthur Rossetti. Tyson Alias, MD, FACP Pgr: [phone] [facility]

## 2014-07-27 NOTE — Progress Notes (Signed)
Attempted to wean patient on PSV/CPAP mode.  Patient heart rate increased to 150s and sats dropped to low 80s.  SBT terminated and placed back on full support ventilation.  RT will continue to monitor.

## 2014-07-28 ENCOUNTER — Inpatient Hospital Stay (HOSPITAL_COMMUNITY): Payer: BC Managed Care – PPO

## 2014-07-28 DIAGNOSIS — J9602 Acute respiratory failure with hypercapnia: Secondary | ICD-10-CM

## 2014-07-28 DIAGNOSIS — J9622 Acute and chronic respiratory failure with hypercapnia: Secondary | ICD-10-CM

## 2014-07-28 DIAGNOSIS — J9600 Acute respiratory failure, unspecified whether with hypoxia or hypercapnia: Secondary | ICD-10-CM

## 2014-07-28 DIAGNOSIS — J939 Pneumothorax, unspecified: Secondary | ICD-10-CM

## 2014-07-28 LAB — POCT I-STAT 3, ART BLOOD GAS (G3+)
Acid-Base Excess: 15 mmol/L — ABNORMAL HIGH (ref 0.0–2.0)
BICARBONATE: 39.8 meq/L — AB (ref 20.0–24.0)
O2 Saturation: 98 %
PCO2 ART: 49.4 mmHg — AB (ref 35.0–45.0)
PH ART: 7.512 — AB (ref 7.350–7.450)
PO2 ART: 94 mmHg (ref 80.0–100.0)
Patient temperature: 97.9
TCO2: 41 mmol/L (ref 0–100)

## 2014-07-28 LAB — BASIC METABOLIC PANEL WITH GFR
Anion gap: 11 (ref 5–15)
BUN: 27 mg/dL — ABNORMAL HIGH (ref 6–23)
CO2: 36 meq/L — ABNORMAL HIGH (ref 19–32)
Calcium: 8.3 mg/dL — ABNORMAL LOW (ref 8.4–10.5)
Chloride: 98 meq/L (ref 96–112)
Creatinine, Ser: 0.58 mg/dL (ref 0.50–1.10)
GFR calc Af Amer: >90 mL/min
GFR calc non Af Amer: >90 mL/min
Glucose, Bld: 151 mg/dL — ABNORMAL HIGH (ref 70–99)
Potassium: 3.6 meq/L — ABNORMAL LOW (ref 3.7–5.3)
Sodium: 145 meq/L (ref 137–147)

## 2014-07-28 LAB — CULTURE, BLOOD (ROUTINE X 2)
CULTURE: NO GROWTH
Culture: NO GROWTH

## 2014-07-28 LAB — CBC
HCT: 23.9 % — ABNORMAL LOW (ref 36.0–46.0)
Hemoglobin: 7.3 g/dL — ABNORMAL LOW (ref 12.0–15.0)
MCH: 29 pg (ref 26.0–34.0)
MCHC: 30.5 g/dL (ref 30.0–36.0)
MCV: 94.8 fL (ref 78.0–100.0)
PLATELETS: 348 10*3/uL (ref 150–400)
RBC: 2.52 MIL/uL — AB (ref 3.87–5.11)
RDW: 16.6 % — ABNORMAL HIGH (ref 11.5–15.5)
WBC: 14.2 10*3/uL — AB (ref 4.0–10.5)

## 2014-07-28 LAB — GLUCOSE, CAPILLARY
GLUCOSE-CAPILLARY: 153 mg/dL — AB (ref 70–99)
Glucose-Capillary: 115 mg/dL — ABNORMAL HIGH (ref 70–99)
Glucose-Capillary: 122 mg/dL — ABNORMAL HIGH (ref 70–99)
Glucose-Capillary: 139 mg/dL — ABNORMAL HIGH (ref 70–99)
Glucose-Capillary: 159 mg/dL — ABNORMAL HIGH (ref 70–99)
Glucose-Capillary: 177 mg/dL — ABNORMAL HIGH (ref 70–99)

## 2014-07-28 LAB — PROTIME-INR
INR: 1.1 (ref 0.00–1.49)
Prothrombin Time: 14.2 seconds (ref 11.6–15.2)

## 2014-07-28 LAB — TROPONIN I

## 2014-07-28 LAB — APTT: aPTT: 26 s (ref 24–37)

## 2014-07-28 MED ORDER — VECURONIUM BROMIDE 10 MG IV SOLR
10.0000 mg | Freq: Once | INTRAVENOUS | Status: AC
  Administered 2014-07-29: 10 mg via INTRAVENOUS
  Filled 2014-07-28 (×2): qty 10

## 2014-07-28 MED ORDER — PROPOFOL 10 MG/ML IV EMUL
5.0000 ug/kg/min | Freq: Once | INTRAVENOUS | Status: AC
  Administered 2014-07-29: 50 ug/kg/min via INTRAVENOUS
  Filled 2014-07-28: qty 100

## 2014-07-28 MED ORDER — ONDANSETRON HCL 4 MG/2ML IJ SOLN
INTRAMUSCULAR | Status: AC
  Filled 2014-07-28: qty 2

## 2014-07-28 MED ORDER — SODIUM CHLORIDE 0.9 % IV BOLUS (SEPSIS)
500.0000 mL | Freq: Once | INTRAVENOUS | Status: AC
  Administered 2014-07-28: 500 mL via INTRAVENOUS

## 2014-07-28 MED ORDER — ETOMIDATE 2 MG/ML IV SOLN
INTRAVENOUS | Status: AC
  Administered 2014-07-28: 20 mg
  Filled 2014-07-28: qty 20

## 2014-07-28 MED ORDER — MIDAZOLAM HCL 2 MG/2ML IJ SOLN
4.0000 mg | Freq: Once | INTRAMUSCULAR | Status: AC
  Administered 2014-07-29: 4 mg via INTRAVENOUS

## 2014-07-28 MED ORDER — FENTANYL CITRATE 0.05 MG/ML IJ SOLN
200.0000 ug | Freq: Once | INTRAMUSCULAR | Status: AC
  Administered 2014-07-29: 200 ug via INTRAVENOUS

## 2014-07-28 MED ORDER — METHYLPREDNISOLONE SODIUM SUCC 40 MG IJ SOLR: 40.0000 mg | Freq: Every day | INTRAMUSCULAR | Status: DC

## 2014-07-28 MED ORDER — HYDROCORTISONE NA SUCCINATE PF 100 MG IJ SOLR
50.0000 mg | Freq: Four times a day (QID) | INTRAMUSCULAR | Status: DC
  Administered 2014-07-28 – 2014-07-30 (×8): 50 mg via INTRAVENOUS
  Filled 2014-07-28 (×12): qty 1

## 2014-07-28 MED ORDER — POTASSIUM CHLORIDE 20 MEQ/15ML (10%) PO LIQD
40.0000 meq | Freq: Once | ORAL | Status: AC
  Administered 2014-07-28: 40 meq
  Filled 2014-07-28 (×2): qty 30

## 2014-07-28 MED ORDER — NOREPINEPHRINE BITARTRATE 1 MG/ML IV SOLN
2.0000 ug/min | INTRAVENOUS | Status: DC
  Administered 2014-07-28: 15 ug/min via INTRAVENOUS
  Administered 2014-07-28: 5 ug/min via INTRAVENOUS
  Filled 2014-07-28 (×4): qty 4

## 2014-07-28 MED ORDER — ONDANSETRON HCL 4 MG/2ML IJ SOLN
4.0000 mg | Freq: Four times a day (QID) | INTRAMUSCULAR | Status: DC | PRN
  Administered 2014-07-28 – 2014-08-15 (×7): 4 mg via INTRAVENOUS
  Filled 2014-07-28 (×8): qty 2

## 2014-07-28 MED ORDER — ETOMIDATE 2 MG/ML IV SOLN
40.0000 mg | Freq: Once | INTRAVENOUS | Status: AC
Start: 2014-07-28 — End: 2014-07-29
  Administered 2014-07-29: 20 mg via INTRAVENOUS
  Filled 2014-07-28: qty 20

## 2014-07-28 NOTE — Progress Notes (Signed)
Myself and the upper level Internal Medicine Resident, Dr. Delane GingerGill, responded to the code blue called for Elizabeth [name].  Dr. Vassie LollAlva was already running the code upon arrival.  CPR was in progress upon arrival.  Pulse was rechecked it was found that Elizabeth [name] had returned to spontaneous circulation.  No further assistance was required.    Dr. Caroleen Hammanumley, DO, PGY-1 [facility]Family Medicine Intern

## 2014-07-28 NOTE — Significant Event (Signed)
Received report from radiologist regarding patient's recent chest x-ray s/p bronch. Dr. Tyson AliasFeinstein spoke with radiologist. Verbal order from Dr. Tyson AliasFeinstein for RN to increase Right CT to 40cm suction and repeat chest x-ray in four hours. Air leak in right CT same as previous (no increase or decrease). Will continue to monitor closely. Britania Shreeve, Charity fundraiserN.

## 2014-07-28 NOTE — Progress Notes (Signed)
eLink Physician-Brief Progress Note Patient Name: Elizabeth [name] DOB: 10-18-1956 MRN: [id number]   Date of Service  07/28/2014  HPI/Events of Note  nausea  eICU Interventions  Prn zofran     Intervention Category Minor Interventions: Routine modifications to care plan (e.g. PRN medications for pain, fever)  Cj Edgell 07/28/2014, 7:02 PM

## 2014-07-28 NOTE — Procedures (Addendum)
Bronchoscopy Procedure Note Dionne Bucyeresa W Knippel [id number] 1956-07-11  Procedure: Bronchoscopy Indications: Diagnostic evaluation of the airways, Obtain specimens for culture and/or other diagnostic studies and Remove secretions  Procedure Details Consent: Risks of procedure as well as the alternatives and risks of each were explained to the (patient/caregiver).  Consent for procedure obtained. Time Out: Verified patient identification, verified procedure, site/side was marked, verified correct patient position, special equipment/implants available, medications/allergies/relevent history reviewed, required imaging and test results available.  Performed  In preparation for procedure, patient was given 100% FiO2 and bronchoscope lubricated. Sedation: Benzodiazepines and Muscle relaxants  Airway entered and the following bronchi were examined: RUL, RML, RLL, LUL, LLL and Bronchi.   Procedures performed: Brushings performed none Bronchoscope removed.  , Patient placed back on 100% FiO2 at conclusion of procedure.    Evaluation Hemodynamic Status: BP stable throughout; O2 sats: stable throughout Patient's Current Condition: stable Specimens:  Sent serosanguinous fluid Complications: No apparent complications Patient did tolerate procedure well.  Reduced peep to 3 for procedure Koda Defrank J. 07/28/2014  1. Large old blood clot obstructing entire left main,and lobes in left, removed 2. 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 No active bleeding post procedure 4.BAL lingula bloody moderate  Mcarthur Rossettianiel J. Tyson AliasFeinstein, MD, FACP Pgr: [phone] [facility]   As risk was described to family for bronch for ptx increase and leak Note basilar ptx rt Hemodynamics and hr wnl, sats wnl, no symptoms noted  Repeat pcxr with suction to 40   Mcarthur Rossettianiel J. Tyson AliasFeinstein, MD, FACP Pgr: [phone] [facility]

## 2014-07-28 NOTE — Progress Notes (Signed)
PULMONARY / CRITICAL CARE MEDICINE   Name: Elizabeth [name] MRN: [id number] DOB: 05/06/1956    ADMISSION DATE:  07/20/2014  REFERRING MD :  Ranae Palms, ED  CHIEF COMPLAINT:  resp distress  INITIAL PRESENTATION: 58 y/o F with severe COPD on home O2, 3rd  admission in last 2 months who presented 9/23 with AECOPD and bilateral pneumothoraces requiring mechanical ventilation.   STUDIES:   SIGNIFICANT EVENTS: 9/23  Bl chest tubes in ED 9/24  Failed SBT > tachycardic, diaphoretic 9/25  Failed SBT again 9/26  Self extubated and re tubed 9/28  Some weaning high PS 9/30  CT's to -20 cm sxn, no air leak, failed SBT in am  10/1 am hrs>>>mucous plug with pea 6 min arrest  SUBJECTIVE/OVERNIGHT/INTERVAL HX:  RN awake, alert  VITAL SIGNS: Temp:  [97.7 F (36.5 C)-99.5 F (37.5 C)] 97.9 F (36.6 C) (10/01 0802) Pulse Rate:  [86-134] 107 (10/01 1045) Resp:  [9-27] 21 (10/01 1045) BP: (67-128)/(48-91) 107/67 mmHg (10/01 1045) SpO2:  [93 %-100 %] 100 % (10/01 1045) FiO2 (%):  [30 %-100 %] 40 % (10/01 1045) Weight:  [70 kg (154 lb 5.2 oz)] 70 kg (154 lb 5.2 oz) (10/01 0500)  HEMODYNAMICS: CVP:  [5 mmHg-15 mmHg] 15 mmHg  VENTILATOR SETTINGS: Vent Mode:  [-] PRVC FiO2 (%):  [30 %-100 %] 40 % Set Rate:  [12 bmp-18 bmp] 18 bmp Vt Set:  [500 mL] 500 mL PEEP:  [5 cmH20] 5 cmH20 Pressure Support:  [5 cmH20] 5 cmH20 Plateau Pressure:  [17 cmH20-38 cmH20] 32 cmH20  INTAKE / OUTPUT:  Intake/Output Summary (Last 24 hours) at 07/28/14 1109 Last data filed at 07/28/14 1040  Gross per 24 hour  Intake 3436.22 ml  Output   2276 ml  Net 1160.22 ml    PHYSICAL EXAMINATION: Gen: anxious on vent, still w/ marked [place] air no change HEENT: jvd wnl PULM: BS distant, significant chest wall crepitus, small lair leak rt CV: s1 s2  regular AB: BS+, soft, nontender no r/g Ext: warm, no edema, crepitus Neuro: awake, rass 0   LABS: PULMONARY  Recent Labs Lab 07/25/14 0500  PHART 7.423  PCO2ART  45.3*  PO2ART 91.4  HCO3 29.0*  TCO2 30.4  O2SAT 97.7   CBC  Recent Labs Lab 07/25/14 0400 07/27/14 0458 07/28/14 0429  HGB 7.3* 7.1* 7.3*  HCT 23.2* 22.7* 23.9*  WBC 15.5* 15.6* 14.2*  PLT 280 331 348   COAGULATION No results found for this basename: INR,  in the last 168 hours  CARDIAC  Recent Labs Lab 07/28/14 0429  TROPONINI <0.30   No results found for this basename: PROBNP,  in the last 168 hours  CHEMISTRY  Recent Labs Lab 07/23/14 0040 07/23/14 0340 07/24/14 0412 07/25/14 0400 07/26/14 0403 07/27/14 0458 07/28/14 0429  NA  --  140 140 139  --  146 145  K  --  4.0 3.9 4.4  --  3.9 3.6*  CL  --  102 100 101  --  103 98  CO2  --  28 31 30   --  35* 36*  GLUCOSE  --  146* 121* 113*  --  127* 151*  BUN  --  15 14 16   --  24* 27*  CREATININE  --  0.54 0.51 0.51  --  0.46* 0.58  CALCIUM  --  7.6* 8.4 8.4  --  8.3* 8.3*  MG 2.3  --  2.2 2.1 2.3 2.3  --   PHOS 2.1*  --  3.1 3.7 4.4 4.3  --    Estimated Creatinine Clearance: 76.4 ml/min (by C-G formula based on Cr of 0.58).  LIVER  Recent Labs Lab 07/24/14 0412 07/25/14 0400  AST 17 16  ALT 29 27  ALKPHOS 37* 37*  BILITOT 0.8 0.8  PROT 4.9* 5.0*  ALBUMIN 2.5* 2.5*   INFECTIOUS  Recent Labs Lab 07/23/14 0040 07/25/14 1343 07/26/14 0403 07/27/14 0458  LATICACIDVEN 0.9  --   --   --   PROCALCITON  --  <0.10 <0.10 <0.10   ENDOCRINE CBG (last 3)   Recent Labs  07/28/14 0014 07/28/14 0403 07/28/14 0757  GLUCAP 115* 177* 159*    IMAGING x48h Dg Chest Port 1 View  07/27/2014   CLINICAL DATA:  Hypoxia  EXAM: PORTABLE CHEST - 1 VIEW  COMPARISON:  July 26, 2014  FINDINGS: Endotracheal tube tip is 4.2 cm above the carina. Central catheter tip is at the junction of the left innominate vein and superior vena cava. Nasogastric tube tip and side port are below the diaphragm. There are bilateral chest tubes. No pneumothorax is appreciable on this study. There is widespread subcutaneous  emphysema, grossly unchanged. There is underlying emphysema. There is patchy atelectatic change in the lower lung zones bilaterally. There is no consolidation. The heart size and pulmonary vascularity are normal. No adenopathy.  IMPRESSION: Tube and catheter positions as described. Widespread subcutaneous emphysema remains. No pneumothorax is demonstrable. There is underlying emphysema with bilateral lower lobe atelectatic change. No new opacity. No change in cardiac silhouette.   Electronically Signed   By: Bretta BangWilliam  Woodruff M.D.   On: 07/27/2014 07:56    ASSESSMENT / PLAN:  PULMONARY OETT 9/23 >> 9/26, 9/26 >>  A:  Acute respiratory failure with hypercarbia - in setting of AECOPD, self extubated 9/26.  Severe COPD - on home O2, bullous emphysema, end stage Bilateral pneumothorax - 9/25 no air leak on R, small on L  Acute exacerbation of COPD Clinical progress with neg balance Mucous plug described 10/1 with arrest P:   sbt noted failed Trend CXR for ptx and chest tubes CT to 20 cm suction, small leak noted right Lasix as below  Solu-Medrol 60 mg Q12, reduce to 40 q12h Upright positioning  Continue Brovana, Pulmicort, Q4 duoneb Consider trach Friday Will discuss role bronch with family, want to avoid with PTX, but cant allow repeat arrest  NEUROLOGIC A:   Pain Severe anxiety No evidence anoxia P:   RASS goal: -1 fent drip Versed gtt, low dose to rass goal  CARDIOVASCULAR  A:  Hypotension, s/p arrest P:  Lasix dc Tele monitoring  Levophed to map 60 Consider to stress steroids Echo assessment cvp 14, lasix off for low BP  RENAL A:  Volume Overload  P:   Trend chemistry in am  Trend BMP  k supp Consider bolus back with shock and pressors and prior lasix Bolus and assess response,  cvp likely up at baseline from copd  GASTROINTESTINAL A:   Ventilator Associated Dysphagia  P:   Continue Vital TF  SUP:  PPI  Bowel Regimen: change to senakot, hold for  diarrhea Continue myrlax  HEMATOLOGIC A:   Anemia - noted drift of hgb from 11 on admit to 7.1 9/30 P:  Tx for Hgb < 7%, active bleeding or MI <8% coags for likely trach  INFECTIOUS A:   Low Clinical Suspicion for Acute Infectious Process P:   Blood culture 9/25 >> resp culture 9/25 >> No orgs  Vanc 9/27>>>9/28 Zosyn 9/27>>9/29 Levaquin, empiric COPD 9/23 >> 9/27  Consider bronch assessment , see pulm  ENDOCRINE A:   Mild Hyperglycemia - no hx DM   P:   SSI Add stress roids in shcok  TODAY'S STAFF MD SUMMARY: s/p arrest thought to be mucous plug, consier bronch assessment, leak, ct to suction remains, consider trach in am   CCM time   I have fully examined this patient and agree with above findings.    And edite di nfull  Mcarthur Rossetti. Tyson Alias, MD, FACP Pgr: [phone] [facility]

## 2014-07-28 NOTE — Significant Event (Signed)
PCCM Interval Progress Note    Called to bedside for code blue.  Upon my arrival, ACLS protocol was underway.  Rhythm on monitor was showing PEA.  According to RN, pt suddenly desaturated shortly after 3:00AM as RT was performing vent check.  At 3:11AM, SpO2 read 5%, RT began to bag pt and the code was called. 2 rounds of epi were administered and ROSC was achieved at 3:17AM.  During my assessment, breath sounds were absent bilaterally (note pt has bilateral chest tubes in place for PTX).  As RT attempted to suction ETT via inner cannular, moderate resistance was met.  Once suction catheter was able to be passed, small amount of thick sputum was noted.  Breath sounds following suction were improved though still moderately diminished.    STAT CXR revealed no new PTX.  EKG revealed SVT with moderate artifact.  Most likely cause for PEA arrest was hypoxia in setting of mucous plugging.  Following ROSC, pt was awake with purposeful movement.  Fentanyl gtt was resumed for sedation.  Troponin has been added to AM labs.   Montey Hora, [facility] Pgr: [phone]  or [phone]

## 2014-07-28 NOTE — Significant Event (Signed)
Bronchoscopy completed at bedside by Dr. Tyson AliasFeinstein. Patient received a total of 2mg  of versed bolus from bag, 100mcg fentanyl bolus from bag, and 20mg  etomidate during the procedure per verbal orders from Dr. Tyson AliasFeinstein. See doc flowsheet for VS results during bronch. [facility]. Sedation reduced r/t low blood pressures. Respiratory cultures to be sent. Awaiting x-ray results. Will continue to monitor. Makinsey Pepitone, Charity fundraiserN.

## 2014-07-28 NOTE — Progress Notes (Signed)
RT responded to pt. Room for assessment and ventilator check. During assessment, RT noticed that the pt. Was desating and becoming distressed. RT began to bag pt. And notify RN. Code was called.

## 2014-07-28 NOTE — Progress Notes (Signed)
eLink Physician-Brief Progress Note Patient Name: Elizabeth [name] DOB: May 21, 1956 MRN: [id number]   Date of Service  07/28/2014  HPI/Events of Note  PEA arrest Hypotensive post ROSC  eICU Interventions  NS bolus x 1L Levo gtt     Intervention Category Major Interventions: Hypotension - evaluation and management  Amel Kitch V. 07/28/2014, 4:27 AM

## 2014-07-29 ENCOUNTER — Inpatient Hospital Stay (HOSPITAL_COMMUNITY): Payer: BC Managed Care – PPO

## 2014-07-29 ENCOUNTER — Encounter (HOSPITAL_COMMUNITY): Payer: Self-pay | Admitting: Internal Medicine

## 2014-07-29 DIAGNOSIS — J9601 Acute respiratory failure with hypoxia: Secondary | ICD-10-CM

## 2014-07-29 LAB — CBC WITH DIFFERENTIAL/PLATELET
BASOS ABS: 0 10*3/uL (ref 0.0–0.1)
BASOS PCT: 0 % (ref 0–1)
Eosinophils Absolute: 0 10*3/uL (ref 0.0–0.7)
Eosinophils Relative: 0 % (ref 0–5)
HEMATOCRIT: 23.7 % — AB (ref 36.0–46.0)
Hemoglobin: 7.3 g/dL — ABNORMAL LOW (ref 12.0–15.0)
Lymphocytes Relative: 12 % (ref 12–46)
Lymphs Abs: 1.9 10*3/uL (ref 0.7–4.0)
MCH: 30 pg (ref 26.0–34.0)
MCHC: 30.8 g/dL (ref 30.0–36.0)
MCV: 97.5 fL (ref 78.0–100.0)
MONO ABS: 0.8 10*3/uL (ref 0.1–1.0)
Monocytes Relative: 5 % (ref 3–12)
NEUTROS ABS: 12.6 10*3/uL — AB (ref 1.7–7.7)
NEUTROS PCT: 83 % — AB (ref 43–77)
Platelets: 333 10*3/uL (ref 150–400)
RBC: 2.43 MIL/uL — ABNORMAL LOW (ref 3.87–5.11)
RDW: 16.3 % — AB (ref 11.5–15.5)
WBC: 15.3 10*3/uL — ABNORMAL HIGH (ref 4.0–10.5)

## 2014-07-29 LAB — COMPREHENSIVE METABOLIC PANEL
ALK PHOS: 78 U/L (ref 39–117)
ALT: 206 U/L — ABNORMAL HIGH (ref 0–35)
AST: 65 U/L — ABNORMAL HIGH (ref 0–37)
Albumin: 2.7 g/dL — ABNORMAL LOW (ref 3.5–5.2)
Anion gap: 7 (ref 5–15)
BILIRUBIN TOTAL: 1 mg/dL (ref 0.3–1.2)
BUN: 19 mg/dL (ref 6–23)
CHLORIDE: 99 meq/L (ref 96–112)
CO2: 37 mEq/L — ABNORMAL HIGH (ref 19–32)
Calcium: 8.4 mg/dL (ref 8.4–10.5)
Creatinine, Ser: 0.42 mg/dL — ABNORMAL LOW (ref 0.50–1.10)
GFR calc non Af Amer: >90 mL/min (ref >90)
GLUCOSE: 123 mg/dL — AB (ref 70–99)
POTASSIUM: 4 meq/L (ref 3.7–5.3)
Sodium: 143 mEq/L (ref 137–147)
Total Protein: 5.2 g/dL — ABNORMAL LOW (ref 6.0–8.3)

## 2014-07-29 LAB — GLUCOSE, CAPILLARY
GLUCOSE-CAPILLARY: 112 mg/dL — AB (ref 70–99)
GLUCOSE-CAPILLARY: 104 mg/dL — AB (ref 70–99)
Glucose-Capillary: 108 mg/dL — ABNORMAL HIGH (ref 70–99)
Glucose-Capillary: 124 mg/dL — ABNORMAL HIGH (ref 70–99)
Glucose-Capillary: 138 mg/dL — ABNORMAL HIGH (ref 70–99)
Glucose-Capillary: 92 mg/dL (ref 70–99)

## 2014-07-29 NOTE — Procedures (Signed)
Bronchoscopy  for Percutaneous  Tracheostomy  Name: Dionne Bucyeresa W Bribiesca MRN: [id number] DOB: [birth year] Procedure: Bronchoscopy for Percutaneous Tracheostomy Indications: Diagnostic evaluation of the airways In conjunction with: Dr. Tyson AliasFeinstein   Procedure Details Consent: Risks of procedure as well as the alternatives and risks of each were explained to the (patient/caregiver).  Consent for procedure obtained. Time Out: Verified patient identification, verified procedure, site/side was marked, verified correct patient position, special equipment/implants available, medications/allergies/relevent history reviewed, required imaging and test results available.  Performed  In preparation for procedure, patient was given 100% FiO2 and bronchoscope lubricated. Sedation: Benzodiazepines, Muscle relaxants, Etomidate and Short-acting barbiturates  Airway entered and the following bronchi were examined: RML.   Procedures performed: Endotracheal Tube retracted in 2 cm increments. Cannulation of airway observed. Dilation observed. Placement of trachel tube  observed . No overt complications. Bronchoscope removed.  , Patient placed back on 100% FiO2 at conclusion of procedure.    Evaluation Hemodynamic Status: Transient hypotension treated with pressors; O2 sats: stable throughout Patient's Current Condition: stable Specimens:  None Complications: No apparent complications Patient did tolerate procedure well.   Brett CanalesSteve Minor ACNP [facility] Pager [phone] till 3 pm If no answer page [phone] 07/29/2014, 11:59 AM  I was present and supervised the entire procedure.  Alyson ReedyWesam G. Yacoub, M.D. [facility]. Pager: [phone]. After hours pager: [phone].

## 2014-07-29 NOTE — Procedures (Signed)
Perc trach See full dictation Blood loss less 5 cc 6 shiley placed  Mcarthur Rossettianiel J. Tyson AliasFeinstein, MD, FACP Pgr: [phone] [facility]

## 2014-07-29 NOTE — Progress Notes (Signed)
PULMONARY / CRITICAL CARE MEDICINE   Name: Elizabeth [name] MRN: [id number] DOB: Oct 12, 1956    ADMISSION DATE:  07/20/2014  REFERRING MD :  Ranae Palms, ED  CHIEF COMPLAINT:  resp distress  INITIAL PRESENTATION: 58 y/o F with severe COPD on home O2, 3rd  admission in last 2 months who presented 9/23 with AECOPD and bilateral pneumothoraces requiring mechanical ventilation.   STUDIES:   SIGNIFICANT EVENTS: 9/23  Bl chest tubes in ED 9/24  Failed SBT > tachycardic, diaphoretic 9/25  Failed SBT again 9/26  Self extubated and re tubed 9/28  Some weaning high PS 9/30  CT's to -20 cm sxn, no air leak, failed SBT in am  10/1 am hrs>>>mucous plug with pea 6 min arrest 10/1 bronch with sig bloody old clot left main, mod to severe tracheal malacia  SUBJECTIVE/OVERNIGHT/INTERVAL HX: PTX resolved   VITAL SIGNS: Temp:  [97.4 F (36.3 C)-98.5 F (36.9 C)] 98.4 F (36.9 C) (10/02 0804) Pulse Rate:  [58-133] 76 (10/02 0822) Resp:  [11-25] 14 (10/02 0822) BP: (76-137)/(44-102) 95/56 mmHg (10/02 0822) SpO2:  [96 %-100 %] 100 % (10/02 0822) FiO2 (%):  [40 %] 40 % (10/02 0822) Weight:  [70.1 kg (154 lb 8.7 oz)] 70.1 kg (154 lb 8.7 oz) (10/02 0500)  HEMODYNAMICS: CVP:  [7 mmHg-15 mmHg] 7 mmHg  VENTILATOR SETTINGS: Vent Mode:  [-] PRVC FiO2 (%):  [40 %] 40 % Set Rate:  [12 bmp-18 bmp] 12 bmp Vt Set:  [450 mL-500 mL] 450 mL PEEP:  [3 cmH20-5 cmH20] 3 cmH20 Plateau Pressure:  [12 cmH20-22 cmH20] 12 cmH20  INTAKE / OUTPUT:  Intake/Output Summary (Last 24 hours) at 07/29/14 1027 Last data filed at 07/29/14 0900  Gross per 24 hour  Intake 2444.86 ml  Output   1455 ml  Net 989.86 ml    PHYSICAL EXAMINATION: Gen: anxious on vent, still w/ marked [place] air no change HEENT: jvd wnl PULM: BS distant, significant chest wall crepitus, small leak on rt CV: s1 s2  regular AB: BS+, soft, nontender no r/g Ext: warm, no edema, crepitus slight reduced on feet Neuro: awake, rass  0   LABS: PULMONARY  Recent Labs Lab 07/25/14 0500 07/28/14 1204  PHART 7.423 7.512*  PCO2ART 45.3* 49.4*  PO2ART 91.4 94.0  HCO3 29.0* 39.8*  TCO2 30.4 41  O2SAT 97.7 98.0   CBC  Recent Labs Lab 07/27/14 0458 07/28/14 0429 07/29/14 0430  HGB 7.1* 7.3* 7.3*  HCT 22.7* 23.9* 23.7*  WBC 15.6* 14.2* 15.3*  PLT 331 348 333   COAGULATION  Recent Labs Lab 07/28/14 1200  INR 1.10    CARDIAC  Recent Labs Lab 07/28/14 0429  TROPONINI <0.30   No results found for this basename: PROBNP,  in the last 168 hours  CHEMISTRY  Recent Labs Lab 07/23/14 0040  07/24/14 0412 07/25/14 0400 07/26/14 0403 07/27/14 0458 07/28/14 0429 07/29/14 0430  NA  --   < > 140 139  --  146 145 143  K  --   < > 3.9 4.4  --  3.9 3.6* 4.0  CL  --   < > 100 101  --  103 98 99  CO2  --   < > 31 30  --  35* 36* 37*  GLUCOSE  --   < > 121* 113*  --  127* 151* 123*  BUN  --   < > 14 16  --  24* 27* 19  CREATININE  --   < >  0.51 0.51  --  0.46* 0.58 0.42*  CALCIUM  --   < > 8.4 8.4  --  8.3* 8.3* 8.4  MG 2.3  --  2.2 2.1 2.3 2.3  --   --   PHOS 2.1*  --  3.1 3.7 4.4 4.3  --   --   < > = values in this interval not displayed. Estimated Creatinine Clearance: 76.4 ml/min (by C-G formula based on Cr of 0.42).  LIVER  Recent Labs Lab 07/24/14 0412 07/25/14 0400 07/28/14 1200 07/29/14 0430  AST 17 16  --  65*  ALT 29 27  --  206*  ALKPHOS 37* 37*  --  78  BILITOT 0.8 0.8  --  1.0  PROT 4.9* 5.0*  --  5.2*  ALBUMIN 2.5* 2.5*  --  2.7*  INR  --   --  1.10  --    INFECTIOUS  Recent Labs Lab 07/23/14 0040 07/25/14 1343 07/26/14 0403 07/27/14 0458  LATICACIDVEN 0.9  --   --   --   PROCALCITON  --  <0.10 <0.10 <0.10   ENDOCRINE CBG (last 3)   Recent Labs  07/28/14 2352 07/29/14 0429 07/29/14 0803  GLUCAP 138* 124* 108*    IMAGING x48h Dg Chest Port 1 View  07/28/2014   CLINICAL DATA:  58 year old female with a history emphysema, now intubated with bilateral  pneumothorax.  EXAM: PORTABLE CHEST - 1 VIEW  COMPARISON:  Multiple prior plain film, most recent 07/28/2014 4:18 p.m.  FINDINGS: Cardiomediastinal silhouette within normal limits in size and contour.  Unchanged position of endotracheal tube which terminates approximately 5 cm above the carina.  Unchanged position of bilateral thoracostomy tubes terminating at the apex. Unchanged left IJ central venous catheter, which appears to terminate in the superior vena cava. Unchanged gastric tube terminating above the field of view.  Extensive chest wall myofacial emphysema/subcutaneous emphysema.  Portions of the right base have been excluded, with the previous sub pulmonic pneumothorax component no longer visible.  No visualized left-sided pneumothorax.  Extensive interstitial and mixed airspace opacities which are unchanged from the comparison.  IMPRESSION: The previously visualized sub pulmonic right-sided pneumothorax is no longer visualized, however, portions of the right base have been excluded from the exam.  Unchanged support apparatus.  Extensive myofacial and subcutaneous chest emphysema.  Signed,  Yvone Neu. Loreta Ave, DO  [facility]  [facility]   Electronically Signed   By: Gilmer Mor O.D.   On: 07/28/2014 21:38   Dg Chest Port 1 View  07/28/2014   CLINICAL DATA:  58 year old female post bronchoscopy.  EXAM: PORTABLE CHEST - 1 VIEW  COMPARISON:  Multiple prior plain film.  Most recent 07/28/2014.  FINDINGS: Cardiomediastinal silhouette is unchanged in size and contour.  Compared to the prior there are now on defibrillator pads on the chest wall.  Unchanged position of bilateral thoracostomy tube, on the right terminating at the apex, and on the left terminating medially at the apex.  Endotracheal tube terminates approximately 4- 5 cm above the carina, unchanged.  Unchanged left IJ central venous catheter. Unchanged position of gastric tube.  Extensive  subcutaneous emphysema on the bilateral thoracic wall again noted.  Persistent right-sided pneumothorax, with appreciable subpulmonic component.  No visualized left pneumothorax.  Mixed interstitial and airspace disease of the bilateral lungs, similar to the comparison.  IMPRESSION: Persisting right-sided pneumothorax, with unchanged position of bilateral thoracostomy tubes.  Extensive subcutaneous and myofacial emphysema of the thorax.  Unchanged  position of endotracheal tube, left IJ central catheter, and gastric tube.  Interval placement of defibrillator pads.  These results were called by telephone at the time of interpretation on 07/28/2014 at 4:57 pm to Dr. Micah Flesheran Renarda Mullinix, who verbally acknowledged these results.  Signed,  Yvone NeuJaime S. Loreta AveWagner, DO  [facility]  [facility]   Electronically Signed   By: Gilmer MorJaime  Wagner O.D.   On: 07/28/2014 16:58   Dg Chest Port 1 View  07/28/2014   CLINICAL DATA:  Post could.  EXAM: PORTABLE CHEST - 1 VIEW  COMPARISON:  07/27/2014  FINDINGS: Endotracheal tube tip measures 5.4 cm above the carina. Bilateral chest tubes are present. Left central venous catheter with tip over the upper SVC region. Enteric tube tip is off the field of view but below the left hemidiaphragm. Extensive subcutaneous emphysema throughout the chest, neck, and visualized extremities. No definitive pneumothorax. No change since previous study.  IMPRESSION: Appliances appear in satisfactory location. Persistent extensive subcutaneous emphysema.   Electronically Signed   By: Burman NievesWilliam  Stevens M.D.   On: 07/28/2014 03:47    ASSESSMENT / PLAN:  PULMONARY OETT 9/23 >> 9/26, 9/26 >>  A:  Acute respiratory failure with hypercarbia - in setting of AECOPD, self extubated 9/26.  Severe COPD - on home O2, bullous emphysema, end stage Bilateral pneumothorax - 9/25 no air leak on R, small on L  Acute exacerbation of COPD Clinical progress with neg balance Mucous  plug described 10/1 with arrest Bronch 10/1 with bloody left main mucous P:   sbt noted failed daily, could attempt PS 12 this am  Trend CXR for ptx and chest tubes in am  CT to 20 cm suction, small leak noted right to 40 , remain, if no ptx on rt in am can re reduce to 20 cm On stress steroids, see cvs Upright positioning  Continue Brovana, Pulmicort, Q4 duoneb Consider trach today  NEUROLOGIC A:   Pain Severe anxiety No evidence anoxia P:   RASS goal: -1 fent drip Versed gtt, low dose to rass goal Keep drips fo rtrach  CARDIOVASCULAR  A:  Hypotension, s/p arrest - resolved Rel AI P:  Allow pos balance Tele monitoring  Consider to stress steroids Echo assessment unable, due to sub q air  RENAL A:  Volume Overload  P:   Trend chemistry in am  kvo status Allow slight pos balance  GASTROINTESTINAL A:   Ventilator Associated Dysphagia  P:   Continue Vital TF - hold trach tolf pt likely to require peg in future SUP:  PPI  Bowel Regimen: change to senakot, hold for diarrhea Continue myrlax  HEMATOLOGIC A:   Anemia - noted drift of hgb from 11 on admit to 7.1 9/30 P:  Tx for Hgb < 7%, active bleeding or MI <8% coags for likely trach  INFECTIOUS A:   Low Clinical Suspicion for Acute Infectious Process P:   Blood culture 9/25 >> resp culture 9/25 >> No orgs BAL 10/1>>> Vanc 9/27>>>9/28 Zosyn 9/27>>9/29 Levaquin, empiric COPD 9/23 >> 9/27  Bronch NOT c/w infection, follow bal  ENDOCRINE A:   Mild Hyperglycemia - no hx DM   P:   SSI Wean stress roids in am if BP ok  TODAY'S STAFF MD SUMMARY: trach planned, kvo, no lasix   CCM time 30minutes   I have fully examined this patient and agree with above findings.    And edite di nfull  Mcarthur RossettiDaniel J. Tyson AliasFeinstein, MD, FACP Pgr: [phone]  [place] [facility]

## 2014-07-29 NOTE — Progress Notes (Signed)
Chaplain followed up as per prior day's appointment.  Living will was explained and completed by Chaplain.  Document is notarized and copy has been entered into pt's chart.  Chaplain prayed with pt and spouse at bedside and provided emotional and spiritual support prior to upcoming procedure.  Chaplain will continue to follow up with pt as needed.  07/29/14 1100  Clinical Encounter Type  Visited With Patient and family together;Health care provider  Visit Type Follow-up;Critical Care  Spiritual Encounters  Spiritual Needs Literature;Emotional  Stress Factors  Patient Stress Factors Exhausted;Health changes  Family Stress Factors Exhausted;Health changes  Advance Directives (For Healthcare)  Does patient have an advance directive? Yes  Type of Advance Directive Living will  Copy of advanced directive(s) in chart? Yes  Blain Paisvercash, Mauricia Mertens A, Chaplain

## 2014-07-29 NOTE — Care Management Note (Addendum)
    Page 1 of 2   08/02/2014     11:04:58 AM CARE MANAGEMENT NOTE 08/02/2014  Patient:  Dionne BucyDALTON,Brianny W   Account Number:  [id number]  Date Initiated:  07/27/2014  Documentation initiated by:  Junius CreamerWELL,DEBBIE  Subjective/Objective Assessment:   adm  w resp failure, vent     Action/Plan:   lives w husband, pcp dr  Olena Leatherwoodhasanaj   Anticipated DC Date:     Anticipated DC Plan:    In-house referral  Clinical Social Worker      DC Planning Services  CM consult      [facility]  Resumption Of Svcs/PTA Provider  LONG TERM ACUTE CARE   Choice offered to / List presented to:          [facility]  [facility]  HH-10 DISEASE MANAGEMENT      HH agency  [facility]   Status of service:   Medicare Important Message given?   (If response is "NO", the following Medicare IM given date fields will be blank) Date Medicare IM given:   Medicare IM given by:   Date Additional Medicare IM given:   Additional Medicare IM given by:    Discharge Disposition:  HOME W HOME HEALTH SERVICES  Per UR Regulation:  Reviewed for med. necessity/level of care/duration of stay  If discussed at Long Length of Stay Meetings, dates discussed:   07/28/2014  07/28/2014    Comments:  10/6 11am debbie Sohum Delillo rn,bns pt off vent and on trach collar. sw ref made in case needs snf for trach.  10/5  1122a debbie Yerlin Gasparyan rn,bsn spoke w pt and husband, explained about ltach's and how her ins does not have contract w [facility] and [facility] will be where her ins has contract. provided husband w address and phone number so he can  take a tour. will cont to follow.  10/2 1320 debbie Khadeja Abt rn,bsn dr Tyson Aliasfeinstein placed trach. pt on vent. dr Corinne Portsfeinstein believs pt will need ltac. explained pt has no inetwork benefits for [facility] and [facility] is who her ins has contract with. pt sedated and fam has left. will try and speak w fam on mon to explain ltac to them. 10/2 1207p debbie Laurice Kimmons rn,bsn trach placed today. checked on ltac  benefits and pt has no ltac in network benefits for [facility] and would have 10.000 out of pocket at [facility]. he in network benefit is with [facility]. she must have trach 7 days-3 failed vent weans-be in hosp 21 days.

## 2014-07-29 NOTE — Procedures (Signed)
Bedside Tracheostomy Insertion Procedure Note   Patient Details:   Name: Elizabeth [name] DOB: February 24, 1956 MRN: [id number]  Procedure: Tracheostomy  Pre Procedure Assessment: ET Tube Size:7.5 ET Tube secured at lip (cm):24   Bite block in place: No Breath Sounds: Clear and Diminished  Post Procedure Assessment: BP 143/86  Pulse 85  Temp(Src) 98.6 F (37 C) (Oral)  Resp 16  Ht 5\' 7"  (1.702 m)  Wt 154 lb 8.7 oz (70.1 kg)  BMI 24.20 kg/m2  SpO2 100% O2 sats: stable throughout Complications: No apparent complications Patient did tolerate procedure well Tracheostomy Brand:Shiley Tracheostomy Style:Cuffed Tracheostomy Size: 6.0 Tracheostomy Secured NWG:NFAOZHYvia:Sutures Tracheostomy Placement Confirmation:Trach cuff visualized and in place and Chest X ray ordered for placement    Leonard DowningFerguson, Faduma Cho Steele 07/29/2014, 2:27 PM

## 2014-07-29 NOTE — Progress Notes (Signed)
Chaplain responded to a page that patient requested chaplain.  Chaplain followed up with pt and family learning they desired to complete and Advanced Directive.  Information had already be given to the family so an appointment was made for tomorrow to review in-depth and complete document.  Chaplain to follow up tomorrow.  Chaplain was asked to pray about upcoming surgery tomorrow.  Chaplain prayed and offered emotional and spiritual support.  07/28/14 1230  Clinical Encounter Type  Visited With Patient and family together  Visit Type Follow-up;Critical Care  Referral From Nurse  Spiritual Encounters  Spiritual Needs Literature;Emotional  Stress Factors  Patient Stress Factors Exhausted;Health changes;Major life changes  Family Stress Factors Exhausted;Family relationships;Health changes;Lack of knowledge;Major life changes  Advance Directives (For Healthcare)  Does patient have an advance directive? No  Would patient like information on creating an advanced directive? Yes - Educational materials given   Blain Paisvercash, Novia Lansberry A, Chaplain

## 2014-07-30 ENCOUNTER — Inpatient Hospital Stay (HOSPITAL_COMMUNITY): Payer: BC Managed Care – PPO

## 2014-07-30 LAB — BASIC METABOLIC PANEL
Anion gap: 6 (ref 5–15)
BUN: 22 mg/dL (ref 6–23)
CHLORIDE: 102 meq/L (ref 96–112)
CO2: 37 mEq/L — ABNORMAL HIGH (ref 19–32)
Calcium: 8.4 mg/dL (ref 8.4–10.5)
Creatinine, Ser: 0.41 mg/dL — ABNORMAL LOW (ref 0.50–1.10)
GFR calc non Af Amer: >90 mL/min (ref >90)
Glucose, Bld: 143 mg/dL — ABNORMAL HIGH (ref 70–99)
Potassium: 3.9 mEq/L (ref 3.7–5.3)
Sodium: 145 mEq/L (ref 137–147)

## 2014-07-30 LAB — GLUCOSE, CAPILLARY
GLUCOSE-CAPILLARY: 109 mg/dL — AB (ref 70–99)
GLUCOSE-CAPILLARY: 128 mg/dL — AB (ref 70–99)
GLUCOSE-CAPILLARY: 129 mg/dL — AB (ref 70–99)
GLUCOSE-CAPILLARY: 161 mg/dL — AB (ref 70–99)
Glucose-Capillary: 103 mg/dL — ABNORMAL HIGH (ref 70–99)

## 2014-07-30 LAB — CBC WITH DIFFERENTIAL/PLATELET
Basophils Absolute: 0 10*3/uL (ref 0.0–0.1)
Basophils Relative: 0 % (ref 0–1)
Eosinophils Absolute: 0 10*3/uL (ref 0.0–0.7)
Eosinophils Relative: 0 % (ref 0–5)
HCT: 24 % — ABNORMAL LOW (ref 36.0–46.0)
HEMOGLOBIN: 7.3 g/dL — AB (ref 12.0–15.0)
LYMPHS ABS: 1.4 10*3/uL (ref 0.7–4.0)
Lymphocytes Relative: 8 % — ABNORMAL LOW (ref 12–46)
MCH: 29.6 pg (ref 26.0–34.0)
MCHC: 30.4 g/dL (ref 30.0–36.0)
MCV: 97.2 fL (ref 78.0–100.0)
Monocytes Absolute: 1 10*3/uL (ref 0.1–1.0)
Monocytes Relative: 6 % (ref 3–12)
NEUTROS PCT: 86 % — AB (ref 43–77)
Neutro Abs: 14.2 10*3/uL — ABNORMAL HIGH (ref 1.7–7.7)
Platelets: 337 10*3/uL (ref 150–400)
RBC: 2.47 MIL/uL — ABNORMAL LOW (ref 3.87–5.11)
RDW: 16.2 % — ABNORMAL HIGH (ref 11.5–15.5)
WBC: 16.7 10*3/uL — ABNORMAL HIGH (ref 4.0–10.5)

## 2014-07-30 MED ORDER — HYDROCORTISONE NA SUCCINATE PF 100 MG IJ SOLR
50.0000 mg | Freq: Three times a day (TID) | INTRAMUSCULAR | Status: DC
  Administered 2014-07-30 – 2014-08-01 (×7): 50 mg via INTRAVENOUS
  Filled 2014-07-30 (×9): qty 1

## 2014-07-30 NOTE — Progress Notes (Signed)
PULMONARY / CRITICAL CARE MEDICINE   Name: Elizabeth [name] MRN: [id number] DOB: 10-10-1956    ADMISSION DATE:  07/20/2014  REFERRING MD :  Ranae Palms, ED  CHIEF COMPLAINT:  resp distress  INITIAL PRESENTATION: 58 y/o F with severe COPD on home O2, 3rd  admission in last 2 months who presented 9/23 with AECOPD and bilateral pneumothoraces requiring mechanical ventilation.   STUDIES:   SIGNIFICANT EVENTS: 9/23  Bl chest tubes in ED 9/24  Failed SBT > tachycardic, diaphoretic 9/25  Failed SBT again 9/26  Self extubated and re tubed 9/28  Some weaning high PS 9/30  CT's to -20 cm sxn, no air leak, failed SBT in am  10/1 am hrs>>>mucous plug with pea 6 min arrest  SUBJECTIVE:   Tolerating some pressure support.  C/o discomfort from chest tubes.  VITAL SIGNS: Temp:  [98.1 F (36.7 C)-98.6 F (37 C)] 98.4 F (36.9 C) (10/03 0737) Pulse Rate:  [85-122] 88 (10/03 0832) Resp:  [11-23] 12 (10/03 0832) BP: (83-143)/(47-86) 126/70 mmHg (10/03 0832) SpO2:  [96 %-100 %] 97 % (10/03 0832) FiO2 (%):  [40 %] 40 % (10/03 0832) Weight:  [159 lb 2.8 oz (72.2 kg)] 159 lb 2.8 oz (72.2 kg) (10/03 0200)  HEMODYNAMICS: CVP:  [3 mmHg-8 mmHg] 6 mmHg  VENTILATOR SETTINGS: Vent Mode:  [-] CPAP;PSV FiO2 (%):  [40 %] 40 % Set Rate:  [12 bmp-16 bmp] 12 bmp Vt Set:  [450 mL] 450 mL PEEP:  [3 cmH20] 3 cmH20 Pressure Support:  [12 cmH20] 12 cmH20 Plateau Pressure:  [14 cmH20-24 cmH20] 14 cmH20  INTAKE / OUTPUT:  Intake/Output Summary (Last 24 hours) at 07/30/14 0851 Last data filed at 07/30/14 0800  Gross per 24 hour  Intake 1236.5 ml  Output    588 ml  Net  648.5 ml    PHYSICAL EXAMINATION: Gen: anxious HEENT: trach site clean PULM: decreased bs, no wheeze CV: regular AB: soft, non tender Ext: chest, upper extremity creptius Neuro: RASS 0, normal strength   LABS: PULMONARY  Recent Labs Lab 07/25/14 0500 07/28/14 1204  PHART 7.423 7.512*  PCO2ART 45.3* 49.4*  PO2ART 91.4  94.0  HCO3 29.0* 39.8*  TCO2 30.4 41  O2SAT 97.7 98.0   CBC  Recent Labs Lab 07/28/14 0429 07/29/14 0430 07/30/14 0350  HGB 7.3* 7.3* 7.3*  HCT 23.9* 23.7* 24.0*  WBC 14.2* 15.3* 16.7*  PLT 348 333 337   COAGULATION  Recent Labs Lab 07/28/14 1200  INR 1.10    CARDIAC  Recent Labs Lab 07/28/14 0429  TROPONINI <0.30   CHEMISTRY  Recent Labs Lab 07/24/14 0412 07/25/14 0400 07/26/14 0403 07/27/14 0458 07/28/14 0429 07/29/14 0430 07/30/14 0350  NA 140 139  --  146 145 143 145  K 3.9 4.4  --  3.9 3.6* 4.0 3.9  CL 100 101  --  103 98 99 102  CO2 31 30  --  35* 36* 37* 37*  GLUCOSE 121* 113*  --  127* 151* 123* 143*  BUN 14 16  --  24* 27* 19 22  CREATININE 0.51 0.51  --  0.46* 0.58 0.42* 0.41*  CALCIUM 8.4 8.4  --  8.3* 8.3* 8.4 8.4  MG 2.2 2.1 2.3 2.3  --   --   --   PHOS 3.1 3.7 4.4 4.3  --   --   --    Estimated Creatinine Clearance: 76.4 ml/min (by C-G formula based on Cr of 0.41).  LIVER  Recent Labs Lab  07/24/14 0412 07/25/14 0400 07/28/14 1200 07/29/14 0430  AST 17 16  --  65*  ALT 29 27  --  206*  ALKPHOS 37* 37*  --  78  BILITOT 0.8 0.8  --  1.0  PROT 4.9* 5.0*  --  5.2*  ALBUMIN 2.5* 2.5*  --  2.7*  INR  --   --  1.10  --    INFECTIOUS  Recent Labs Lab 07/25/14 1343 07/26/14 0403 07/27/14 0458  PROCALCITON <0.10 <0.10 <0.10   ENDOCRINE CBG (last 3)   Recent Labs  07/29/14 2359 07/30/14 0410 07/30/14 0725  GLUCAP 109* 161* 128*    IMAGING x48h Chest Portable 1 View To Assess Tube Placement And Rule-out Pneumothorax  07/29/2014   CLINICAL DATA:  Tracheostomy placement.  Evaluate pneumothorax.  EXAM: PORTABLE CHEST - 1 VIEW  COMPARISON:  07/29/2014.  FINDINGS: Support apparatus: Replacement of endotracheal tube with tracheostomy. Other support apparatus is unchanged with bilateral thoracostomy tubes. Enteric tube and LEFT IJ central line also unchanged.  Cardiomediastinal Silhouette:  Unchanged.  Lungs: Basilar atelectasis  and airspace opacity. Small RIGHT basilar pneumothorax is unchanged compared to prior.  Effusions:  None.  Other:  Monitoring leads project over the chest.  IMPRESSION: 1. Uncomplicated interval tracheostomy placement. 2. Other support apparatus stable. 3. Unchanged RIGHT basilar pneumothorax and bibasilar airspace disease and atelectasis.   Electronically Signed   By: Andreas NewportGeoffrey  Lamke M.D.   On: 07/29/2014 12:52   Dg Chest Port 1 View  07/29/2014   CLINICAL DATA:  COPD, acute respiratory failure with shortness of breath, check endotracheal tube placement  EXAM: PORTABLE CHEST - 1 VIEW  COMPARISON:  07/28/2014  FINDINGS: Cardiac shadow is stable. An endotracheal tube is noted 4.5 cm above the carina. A left central venous line is noted with the catheter tip in the proximal superior vena cava. Nasogastric catheter is again noted coursing into the stomach.  Bilateral thoracostomy catheters are again identified. No pneumothorax is seen. No focal infiltrate is seen. Diffuse subcutaneous emphysema is noted.  IMPRESSION: No significant interval change from the prior exam. No new focal abnormality is noted.   Electronically Signed   By: Alcide CleverMark  Lukens M.D.   On: 07/29/2014 07:41   Dg Abd Portable 1v  07/29/2014   CLINICAL DATA:  Pain into placement  EXAM: PORTABLE ABDOMEN - 1 VIEW  COMPARISON:  Abdominal film 07/23/2014  FINDINGS: Feeding tube with weighted tip just distal to the GE junction within the gastric cardiac region. No evidence of bowel obstruction.  IMPRESSION: Feeding tube with tip in the stomach just past the GE junction.   Electronically Signed   By: Genevive BiStewart  Edmunds M.D.   On: 07/29/2014 12:41    ASSESSMENT / PLAN:  PULMONARY OETT 9/23 >> 9/26, 9/26 >> 10/02 Trach (DF) 10/02 >>  A:  Acute on chronic hypoxic/hypercapnic respiratory failure 2nd to AECOPD and b/l spontaneous PTX. Hx of GOLD 4 COPD with bullous emphysema. Mucus plug causing respiratory/cardiac arrest 10/1. P:   Pressure support  wean to trach collar as tolerated Continue b/l chest tubes >> still has air leak on Rt on 10/03 F/u CXR Continue Brovana, Pulmicort, Q4 duoneb  NEUROLOGIC A:   Anxiety. P:   RASS goal: 0  CARDIOVASCULAR A:  Hypotension, s/p arrest >> resolved. P:  Monitor hemodynamics  RENAL A:  Hypervolemia >> improved. P:   Monitor renal fx, urine outpt, electrolytes Goal even fluid balance  GASTROINTESTINAL A:   Nutrition. P:   Tube feeds  Protonix for SUP Continue miralax  HEMATOLOGIC A:   Anemia of critical illness and chronic disease. P:  F/u CBC SQ heparin for DVT prevention  INFECTIOUS A:   AECOPD >> completed Abx. P:   Monitor clinically  ENDOCRINE A:   Mild Hyperglycemia - no hx DM. Adrenal insufficiency.  P:   SSI Wean off steroids as tolerated starting 10/03  CC time 35 minutes.  Coralyn Helling, MD [facility] Pulmonary/Critical Care 07/30/2014, 9:00 AM Pager:  [phone] After 3pm call: [phone]

## 2014-07-31 ENCOUNTER — Inpatient Hospital Stay (HOSPITAL_COMMUNITY): Payer: BC Managed Care – PPO

## 2014-07-31 LAB — IRON AND TIBC
Iron: 21 ug/dL — ABNORMAL LOW (ref 42–135)
SATURATION RATIOS: 8 % — AB (ref 20–55)
TIBC: 265 ug/dL (ref 250–470)
UIBC: 244 ug/dL (ref 125–400)

## 2014-07-31 LAB — FERRITIN: FERRITIN: 83 ng/mL (ref 10–291)

## 2014-07-31 LAB — RETICULOCYTES
RBC.: 2.44 MIL/uL — AB (ref 3.87–5.11)
RETIC CT PCT: 4 % — AB (ref 0.4–3.1)
Retic Count, Absolute: 97.6 10*3/uL (ref 19.0–186.0)

## 2014-07-31 LAB — BASIC METABOLIC PANEL
Anion gap: 3 — ABNORMAL LOW (ref 5–15)
BUN: 22 mg/dL (ref 6–23)
CO2: 41 mEq/L (ref 19–32)
CREATININE: 0.4 mg/dL — AB (ref 0.50–1.10)
Calcium: 8.4 mg/dL (ref 8.4–10.5)
Chloride: 103 mEq/L (ref 96–112)
GFR calc non Af Amer: >90 mL/min (ref >90)
GLUCOSE: 128 mg/dL — AB (ref 70–99)
POTASSIUM: 4.1 meq/L (ref 3.7–5.3)
Sodium: 147 mEq/L (ref 137–147)

## 2014-07-31 LAB — CBC
HCT: 23.6 % — ABNORMAL LOW (ref 36.0–46.0)
Hemoglobin: 7 g/dL — ABNORMAL LOW (ref 12.0–15.0)
MCH: 29.5 pg (ref 26.0–34.0)
MCHC: 29.7 g/dL — AB (ref 30.0–36.0)
MCV: 99.6 fL (ref 78.0–100.0)
Platelets: 308 10*3/uL (ref 150–400)
RBC: 2.37 MIL/uL — AB (ref 3.87–5.11)
RDW: 16.3 % — ABNORMAL HIGH (ref 11.5–15.5)
WBC: 15.4 10*3/uL — ABNORMAL HIGH (ref 4.0–10.5)

## 2014-07-31 LAB — CULTURE, RESPIRATORY W GRAM STAIN: Culture: NO GROWTH

## 2014-07-31 LAB — CULTURE, RESPIRATORY

## 2014-07-31 LAB — GLUCOSE, CAPILLARY
GLUCOSE-CAPILLARY: 126 mg/dL — AB (ref 70–99)
GLUCOSE-CAPILLARY: 129 mg/dL — AB (ref 70–99)
GLUCOSE-CAPILLARY: 141 mg/dL — AB (ref 70–99)
Glucose-Capillary: 122 mg/dL — ABNORMAL HIGH (ref 70–99)
Glucose-Capillary: 124 mg/dL — ABNORMAL HIGH (ref 70–99)
Glucose-Capillary: 128 mg/dL — ABNORMAL HIGH (ref 70–99)
Glucose-Capillary: 136 mg/dL — ABNORMAL HIGH (ref 70–99)

## 2014-07-31 LAB — FOLATE: Folate: 17.3 ng/mL

## 2014-07-31 LAB — VITAMIN B12: VITAMIN B 12: 952 pg/mL — AB (ref 211–911)

## 2014-07-31 MED ORDER — LORAZEPAM 2 MG/ML IJ SOLN
1.0000 mg | INTRAMUSCULAR | Status: DC | PRN
  Administered 2014-07-31 – 2014-08-15 (×18): 1 mg via INTRAVENOUS
  Filled 2014-07-31 (×18): qty 1

## 2014-07-31 NOTE — Progress Notes (Signed)
PULMONARY / CRITICAL CARE MEDICINE   Name: Elizabeth [name] MRN: [id number] DOB: 05-30-1956    ADMISSION DATE:  07/20/2014  REFERRING MD :  Ranae PalmsYelverton, ED  CHIEF COMPLAINT:  resp distress  INITIAL PRESENTATION: 58 y/o F with severe COPD on home O2, 3rd  admission in last 2 months who presented 9/23 with AECOPD and bilateral pneumothoraces requiring mechanical ventilation.   STUDIES:   SIGNIFICANT EVENTS: 9/23  Bl chest tubes in ED 9/24  Failed SBT > tachycardic, diaphoretic 9/25  Failed SBT again 9/26  Self extubated and re tubed 9/28  Some weaning high PS 9/30  CT's to -20 cm sxn, no air leak, failed SBT in am  10/1 am hrs>>>mucous plug with pea 6 min arrest  SUBJECTIVE:   Feels anxious.  VITAL SIGNS: Temp:  [98.2 F (36.8 C)-98.8 F (37.1 C)] 98.8 F (37.1 C) (10/04 0749) Pulse Rate:  [84-131] 110 (10/04 1000) Resp:  [12-25] 21 (10/04 1000) BP: (80-132)/(45-70) 113/65 mmHg (10/04 1000) SpO2:  [96 %-100 %] 99 % (10/04 1000) FiO2 (%):  [40 %] 40 % (10/04 0921)  VENTILATOR SETTINGS: Vent Mode:  [-] PRVC FiO2 (%):  [40 %] 40 % Set Rate:  [12 bmp] 12 bmp Vt Set:  [450 mL] 450 mL PEEP:  [3 cmH20] 3 cmH20 Pressure Support:  [12 cmH20] 12 cmH20 Plateau Pressure:  [10 cmH20-19 cmH20] 10 cmH20  INTAKE / OUTPUT:  Intake/Output Summary (Last 24 hours) at 07/31/14 1027 Last data filed at 07/31/14 1000  Gross per 24 hour  Intake   2290 ml  Output   1080 ml  Net   1210 ml    PHYSICAL EXAMINATION: Gen: anxious HEENT: trach site clean PULM: decreased bs, no wheeze, no air leak Lt, + air leak Rt CV: regular AB: soft, non tender Ext: chest, upper extremity creptius Neuro: RASS 0, normal strength   LABS: PULMONARY  Recent Labs Lab 07/25/14 0500 07/28/14 1204  PHART 7.423 7.512*  PCO2ART 45.3* 49.4*  PO2ART 91.4 94.0  HCO3 29.0* 39.8*  TCO2 30.4 41  O2SAT 97.7 98.0   CBC  Recent Labs Lab 07/29/14 0430 07/30/14 0350 07/31/14 0627  HGB 7.3* 7.3* 7.0*   HCT 23.7* 24.0* 23.6*  WBC 15.3* 16.7* 15.4*  PLT 333 337 308   COAGULATION  Recent Labs Lab 07/28/14 1200  INR 1.10    CARDIAC  Recent Labs Lab 07/28/14 0429  TROPONINI <0.30   CHEMISTRY  Recent Labs Lab 07/25/14 0400 07/26/14 0403 07/27/14 0458 07/28/14 0429 07/29/14 0430 07/30/14 0350 07/31/14 0627  NA 139  --  146 145 143 145 147  K 4.4  --  3.9 3.6* 4.0 3.9 4.1  CL 101  --  103 98 99 102 103  CO2 30  --  35* 36* 37* 37* 41*  GLUCOSE 113*  --  127* 151* 123* 143* 128*  BUN 16  --  24* 27* 19 22 22   CREATININE 0.51  --  0.46* 0.58 0.42* 0.41* 0.40*  CALCIUM 8.4  --  8.3* 8.3* 8.4 8.4 8.4  MG 2.1 2.3 2.3  --   --   --   --   PHOS 3.7 4.4 4.3  --   --   --   --    Estimated Creatinine Clearance: 76.4 ml/min (by C-G formula based on Cr of 0.4).  LIVER  Recent Labs Lab 07/25/14 0400 07/28/14 1200 07/29/14 0430  AST 16  --  65*  ALT 27  --  206*  ALKPHOS 37*  --  78  BILITOT 0.8  --  1.0  PROT 5.0*  --  5.2*  ALBUMIN 2.5*  --  2.7*  INR  --  1.10  --    INFECTIOUS  Recent Labs Lab 07/25/14 1343 07/26/14 0403 07/27/14 0458  PROCALCITON <0.10 <0.10 <0.10   ENDOCRINE CBG (last 3)   Recent Labs  07/30/14 2355 07/31/14 0351 07/31/14 0722  GLUCAP 124* 136* 126*    IMAGING x48h Dg Chest Port 1 View  07/30/2014   CLINICAL DATA:  Pneumothorax, subsequent encounter  EXAM: PORTABLE CHEST - 1 VIEW  COMPARISON:  07/29/2014; 07/28/2014; 07/27/2014; 07/26/2014  FINDINGS: Grossly unchanged cardiac silhouette and mediastinal contours. Stable position of support apparatus. Apparent resolution of tiny right basilar pneumothorax. No definite pneumothorax though again, evaluation is degraded secondary to extensive overlying chest wall subcutaneous emphysema which appears grossly unchanged. Improved aeration of lungs with persistent bilateral infrahilar opacities, left greater than right. No new focal airspace opacities. No definite pleural effusion, though  note, the bilateral costophrenic angles are excluded from view. No definite evidence of edema. Unchanged bones.  IMPRESSION: 1. Stable positioning of support apparatus. No definite pneumothorax with apparent resolution of previously noted tiny right basilar pneumothorax. 2. The amount of extensive bilateral chest wall subcutaneous emphysema is grossly unchanged. 3. Improved aeration of the lungs with persistent infrahilar opacities, likely atelectasis.   Electronically Signed   By: Simonne Come M.D.   On: 07/30/2014 08:04    ASSESSMENT / PLAN:  PULMONARY OETT 9/23 >> 9/26, 9/26 >> 10/02 Trach (DF) 10/02 >>  A:  Acute on chronic hypoxic/hypercapnic respiratory failure 2nd to AECOPD and b/l spontaneous PTX. Hx of GOLD 4 COPD with bullous emphysema. Mucus plug causing respiratory/cardiac arrest 10/1. P:   Pressure support wean to trach collar as tolerated Change Lt chest tube to -10 suction Continue - 40 suction for Rt chest tube F/u CXR Continue Brovana, Pulmicort, Q4 duoneb  NEUROLOGIC A:   Anxiety. P:   RASS goal: 0 Prn Ativan  CARDIOVASCULAR A:  Hypotension, s/p arrest >> resolved. P:  Monitor hemodynamics  RENAL A:  Hypervolemia >> improved. P:   Monitor renal fx, urine outpt, electrolytes Goal even fluid balance  GASTROINTESTINAL A:   Nutrition. P:   Tube feeds Protonix for SUP Continue miralax  HEMATOLOGIC A:   Anemia of critical illness and chronic disease. P:  F/u CBC SQ heparin for DVT prevention  INFECTIOUS A:   AECOPD >> completed Abx. P:   Monitor clinically  ENDOCRINE A:   Mild Hyperglycemia - no hx DM. Adrenal insufficiency.  P:   SSI Wean off steroids as tolerated starting 10/03 >> change to q12h on 10/05  Family update: Updated husband 10/04  Interdisciplinary meeting:  CC time 35 minutes.  Coralyn Helling, MD [facility] Pulmonary/Critical Care 07/31/2014, 10:27 AM Pager:  [phone] After 3pm call: [phone]

## 2014-07-31 NOTE — Progress Notes (Signed)
CRITICAL VALUE ALERT  Critical value received:   CO2 of 41   Date of notification:   07/31/14  Time of notification:  0715  Critical value read back:Yes.    Nurse who received alert:   Cher NakaiLaTanya Gracyn Allor   MD notified (1st page):  Devra DoppSteve Minor, NP   Time of first page:  [phone]  MD notified (2nd page):  Time of second page:  Responding MD:  Clayton LefortV. Sood  Time MD responded:  [phone]

## 2014-08-01 ENCOUNTER — Inpatient Hospital Stay (HOSPITAL_COMMUNITY): Payer: BC Managed Care – PPO

## 2014-08-01 DIAGNOSIS — J441 Chronic obstructive pulmonary disease with (acute) exacerbation: Principal | ICD-10-CM

## 2014-08-01 DIAGNOSIS — J449 Chronic obstructive pulmonary disease, unspecified: Secondary | ICD-10-CM

## 2014-08-01 LAB — CBC
HCT: 23.9 % — ABNORMAL LOW (ref 36.0–46.0)
HEMOGLOBIN: 7 g/dL — AB (ref 12.0–15.0)
MCH: 29.9 pg (ref 26.0–34.0)
MCHC: 29.3 g/dL — ABNORMAL LOW (ref 30.0–36.0)
MCV: 102.1 fL — AB (ref 78.0–100.0)
Platelets: 300 10*3/uL (ref 150–400)
RBC: 2.34 MIL/uL — ABNORMAL LOW (ref 3.87–5.11)
RDW: 16.2 % — ABNORMAL HIGH (ref 11.5–15.5)
WBC: 15 10*3/uL — ABNORMAL HIGH (ref 4.0–10.5)

## 2014-08-01 LAB — GLUCOSE, CAPILLARY
GLUCOSE-CAPILLARY: 157 mg/dL — AB (ref 70–99)
GLUCOSE-CAPILLARY: 123 mg/dL — AB (ref 70–99)
GLUCOSE-CAPILLARY: 140 mg/dL — AB (ref 70–99)
Glucose-Capillary: 109 mg/dL — ABNORMAL HIGH (ref 70–99)
Glucose-Capillary: 115 mg/dL — ABNORMAL HIGH (ref 70–99)

## 2014-08-01 LAB — BASIC METABOLIC PANEL
ANION GAP: 3 — AB (ref 5–15)
BUN: 24 mg/dL — ABNORMAL HIGH (ref 6–23)
CHLORIDE: 104 meq/L (ref 96–112)
CO2: 41 mEq/L (ref 19–32)
CREATININE: 0.39 mg/dL — AB (ref 0.50–1.10)
Calcium: 8.3 mg/dL — ABNORMAL LOW (ref 8.4–10.5)
GFR calc Af Amer: >90 mL/min (ref >90)
GFR calc non Af Amer: >90 mL/min (ref >90)
GLUCOSE: 131 mg/dL — AB (ref 70–99)
Potassium: 3.9 mEq/L (ref 3.7–5.3)
Sodium: 148 mEq/L — ABNORMAL HIGH (ref 137–147)

## 2014-08-01 MED ORDER — FREE WATER
300.0000 mL | Freq: Three times a day (TID) | Status: DC
  Administered 2014-08-01 – 2014-08-03 (×6): 300 mL

## 2014-08-01 MED ORDER — HYDROCORTISONE NA SUCCINATE PF 100 MG IJ SOLR
50.0000 mg | Freq: Two times a day (BID) | INTRAMUSCULAR | Status: DC
  Administered 2014-08-02 – 2014-08-03 (×2): 50 mg via INTRAVENOUS
  Filled 2014-08-01 (×3): qty 1

## 2014-08-01 NOTE — Progress Notes (Signed)
RN reports pt w/ very high anxiety episode this morning.  Attempted wean on previous PSV settings yesterday.  Will attempt to wean pt today, possibly try ATC if pt anxiety levels can tolerate.

## 2014-08-01 NOTE — Progress Notes (Signed)
Requested RT place patient back on rest mode per vent for HR sustained >140, and climbing into 150's prior to placing on rest mode. While patient appeared to be tolerating the trach collar well, no obvious signs of distress, she was more lethargic and was " twitching" more. Per the patient's family, this is usually what she does when her CO2 is climbing. The patient has been on CPAP 5/5 since early this morning and then trach collar for about 2 hours, which is good progress for her.I was concerned she was getting fatigued, in addition to the sustained tachycardia.Currently resting on full vent support with family at bedside.

## 2014-08-01 NOTE — Progress Notes (Signed)
Dr. Wright in e-lDelford Fieldink notified that patient was having a sustained increased heart rate with trach collar wean. Patient is easily awakened, RR is 18-20, SaO2 is 94%. From a respiratory standpoint she looks good. Dr. Delford FieldWright cameraed into the room and visually assessed the patient. We agreed to keep an eye on her vs. Giving anything for heart rate. Will continue to monitor.

## 2014-08-01 NOTE — Progress Notes (Signed)
[facility] Result taken by: Lowella BandyNikki, RN  Critical Lab: CO2 41  Time Called: 6:40 AM   Marvia PicklesJames, Ulyana Pitones Sara, RN

## 2014-08-01 NOTE — Progress Notes (Signed)
PULMONARY / CRITICAL CARE MEDICINE   Name: Elizabeth [name] MRN: [id number] DOB: [birth year]    ADMISSION DATE:  07/20/2014  REFERRING MD :  Ranae PalmsYelverton, ED  CHIEF COMPLAINT:  resp distress  INITIAL PRESENTATION: 58 y/o F with severe COPD on home O2, 3rd  admission in last 2 months who presented 9/23 with AECOPD and bilateral pneumothoraces requiring mechanical ventilation.   STUDIES:   SIGNIFICANT EVENTS: 9/23  Bl chest tubes in ED 9/24  Failed SBT > tachycardic, diaphoretic 9/25  Failed SBT again 9/26  Self extubated and re tubed 9/28  Some weaning high PS 9/30  CT's to -20 cm sxn, no air leak, failed SBT in am  10/1 am hrs>>>mucous plug with pea 6 min arrest  SUBJECTIVE:   Less anxious, on PSV at this time.   VITAL SIGNS: Temp:  [98.3 F (36.8 C)-99 F (37.2 C)] 98.6 F (37 C) (10/05 1228) Pulse Rate:  [84-134] 119 (10/05 1200) Resp:  [11-22] 16 (10/05 1200) BP: (77-135)/(45-78) 120/58 mmHg (10/05 1200) SpO2:  [95 %-100 %] 99 % (10/05 1200) FiO2 (%):  [10 %-40 %] 40 % (10/05 1200) Weight:  [73.9 kg (162 lb 14.7 oz)] 73.9 kg (162 lb 14.7 oz) (10/05 0400)  VENTILATOR SETTINGS: Vent Mode:  [-] PSV;CPAP FiO2 (%):  [10 %-40 %] 40 % Set Rate:  [12 bmp] 12 bmp Vt Set:  [450 mL] 450 mL PEEP:  [3 cmH20] 3 cmH20 Pressure Support:  [12 cmH20] 12 cmH20 Plateau Pressure:  [19 cmH20] 19 cmH20  INTAKE / OUTPUT:  Intake/Output Summary (Last 24 hours) at 08/01/14 1433 Last data filed at 08/01/14 1300  Gross per 24 hour  Intake   2320 ml  Output    985 ml  Net   1335 ml    PHYSICAL EXAMINATION: Gen: anxious HEENT: trach site clean PULM: decreased bs, no wheeze, no air leak Lt, + air leak Rt CV: regular AB: soft, non tender Ext: chest, upper extremity creptius Neuro: RASS 0, normal strength   LABS: PULMONARY  Recent Labs Lab 07/28/14 1204  PHART 7.512*  PCO2ART 49.4*  PO2ART 94.0  HCO3 39.8*  TCO2 41  O2SAT 98.0   CBC  Recent Labs Lab 07/30/14 0350  07/31/14 0627 08/01/14 0530  HGB 7.3* 7.0* 7.0*  HCT 24.0* 23.6* 23.9*  WBC 16.7* 15.4* 15.0*  PLT 337 308 300   COAGULATION  Recent Labs Lab 07/28/14 1200  INR 1.10    CARDIAC  Recent Labs Lab 07/28/14 0429  TROPONINI <0.30   CHEMISTRY  Recent Labs Lab 07/26/14 0403  07/27/14 0458 07/28/14 0429 07/29/14 0430 07/30/14 0350 07/31/14 0627 08/01/14 0530  NA  --   < > 146 145 143 145 147 148*  K  --   < > 3.9 3.6* 4.0 3.9 4.1 3.9  CL  --   < > 103 98 99 102 103 104  CO2  --   < > 35* 36* 37* 37* 41* 41*  GLUCOSE  --   < > 127* 151* 123* 143* 128* 131*  BUN  --   < > 24* 27* 19 22 22  24*  CREATININE  --   < > 0.46* 0.58 0.42* 0.41* 0.40* 0.39*  CALCIUM  --   < > 8.3* 8.3* 8.4 8.4 8.4 8.3*  MG 2.3  --  2.3  --   --   --   --   --   PHOS 4.4  --  4.3  --   --   --   --   --   < > =  values in this interval not displayed. Estimated Creatinine Clearance: 76.4 ml/min (by C-G formula based on Cr of 0.39).  LIVER  Recent Labs Lab 07/28/14 1200 07/29/14 0430  AST  --  65*  ALT  --  206*  ALKPHOS  --  78  BILITOT  --  1.0  PROT  --  5.2*  ALBUMIN  --  2.7*  INR 1.10  --    INFECTIOUS  Recent Labs Lab 07/26/14 0403 07/27/14 0458  PROCALCITON <0.10 <0.10   ENDOCRINE CBG (last 3)   Recent Labs  08/01/14 0319 08/01/14 0753 08/01/14 1227  GLUCAP 109* 115* 157*    IMAGING x48h Dg Chest Port 1 View  07/31/2014   CLINICAL DATA:  Evaluate pneumothorax. COPD. Hypertension. Subsequent encounter.  EXAM: PORTABLE CHEST - 1 VIEW  COMPARISON:  07/30/2014; 07/29/2014; 07/28/2014  FINDINGS: Grossly unchanged cardiac silhouette and mediastinal contours. Stable positioning of support apparatus. Interval development of a trace right-sided pneumothorax. No definite left-sided pneumothorax though evaluation again degraded secondary to large amount of subcutaneous emphysema within the chest wall, which appears grossly unchanged. Linear heterogeneous opacities within the  left mid lung are unchanged. No new focal airspace opacities. No pleural effusion. No definite evidence of edema. Unchanged bones.  IMPRESSION: 1. Stable positioning of support apparatus with development of a tiny right-sided pneumothorax. No definite left-sided pneumothorax. 2. Grossly unchanged heterogeneous opacities within the peripheral aspect of the left mid lung. No new focal airspace opacities.   Electronically Signed   By: Simonne Come M.D.   On: 07/31/2014 08:26    ASSESSMENT / PLAN:  PULMONARY OETT 9/23 >> 9/26, 9/26 >> 10/02 Trach (DF) 10/02 >>  A:  Acute on chronic hypoxic/hypercapnic respiratory failure 2nd to AECOPD and b/l spontaneous PTX. Hx of GOLD 4 COPD with bullous emphysema. Mucus plug causing respiratory/cardiac arrest 10/1. P:   Pressure support wean to trach collar as tolerated Change Lt chest tube to -10 suction Continue - 40 suction for Rt chest tube F/u CXR Continue Brovana, Pulmicort, Q4 duoneb  NEUROLOGIC A:   Anxiety. P:   RASS goal: 0 Prn Ativan  CARDIOVASCULAR A:  Hypotension, s/p arrest >> resolved. P:  Monitor hemodynamics  RENAL A:  Hypervolemia >> improved. Hypernatremia P:   Monitor renal fx, urine outpt, electrolytes Goal even fluid balance Add free water 10/5  GASTROINTESTINAL A:   Nutrition. P:   Tube feeds Protonix for SUP Continue miralax  HEMATOLOGIC A:   Anemia of critical illness and chronic disease. P:  F/u CBC SQ heparin for DVT prevention  INFECTIOUS A:   AECOPD >> completed Abx. P:   Monitor clinically  ENDOCRINE A:   Mild Hyperglycemia - no hx DM. Adrenal insufficiency.  P:   SSI Wean off steroids as tolerated starting 10/03 >> change to hydrocort q12h on 10/05  Family update: Updated husband 10/04  Interdisciplinary meeting:  CC time 35 minutes.  Levy Pupa, MD, PhD 08/01/2014, 2:37 PM [facility] [phone] or if no answer [phone]

## 2014-08-01 NOTE — Progress Notes (Signed)
Pt placed back on vent support per RN request d/t increased HR 140's.  Pt denies SOB.

## 2014-08-01 NOTE — Progress Notes (Signed)
Pt placed on ATC per discussion w/ Dr. Delton CoombesByrum.  Starting HR 131. Pt tol ATC well so far, no distress noted.  RR 15-18.  Pt appears comfortable presently.

## 2014-08-02 ENCOUNTER — Inpatient Hospital Stay (HOSPITAL_COMMUNITY): Payer: BC Managed Care – PPO

## 2014-08-02 DIAGNOSIS — J439 Emphysema, unspecified: Secondary | ICD-10-CM

## 2014-08-02 DIAGNOSIS — J9622 Acute and chronic respiratory failure with hypercapnia: Secondary | ICD-10-CM

## 2014-08-02 LAB — CBC
HCT: 22.9 % — ABNORMAL LOW (ref 36.0–46.0)
Hemoglobin: 6.8 g/dL — CL (ref 12.0–15.0)
MCH: 30.2 pg (ref 26.0–34.0)
MCHC: 29.7 g/dL — AB (ref 30.0–36.0)
MCV: 101.8 fL — ABNORMAL HIGH (ref 78.0–100.0)
Platelets: 289 10*3/uL (ref 150–400)
RBC: 2.25 MIL/uL — ABNORMAL LOW (ref 3.87–5.11)
RDW: 16.3 % — AB (ref 11.5–15.5)
WBC: 12.5 10*3/uL — ABNORMAL HIGH (ref 4.0–10.5)

## 2014-08-02 LAB — CBC WITH DIFFERENTIAL/PLATELET
BASOS ABS: 0 10*3/uL (ref 0.0–0.1)
BASOS PCT: 0 % (ref 0–1)
EOS ABS: 0.2 10*3/uL (ref 0.0–0.7)
EOS PCT: 1 % (ref 0–5)
HCT: 24.7 % — ABNORMAL LOW (ref 36.0–46.0)
Hemoglobin: 7 g/dL — ABNORMAL LOW (ref 12.0–15.0)
Lymphocytes Relative: 17 % (ref 12–46)
Lymphs Abs: 2.5 10*3/uL (ref 0.7–4.0)
MCH: 29.5 pg (ref 26.0–34.0)
MCHC: 28.3 g/dL — ABNORMAL LOW (ref 30.0–36.0)
MCV: 104.2 fL — ABNORMAL HIGH (ref 78.0–100.0)
Monocytes Absolute: 1 10*3/uL (ref 0.1–1.0)
Monocytes Relative: 6 % (ref 3–12)
Neutro Abs: 11.5 10*3/uL — ABNORMAL HIGH (ref 1.7–7.7)
Neutrophils Relative %: 76 % (ref 43–77)
PLATELETS: 301 10*3/uL (ref 150–400)
RBC: 2.37 MIL/uL — ABNORMAL LOW (ref 3.87–5.11)
RDW: 16.5 % — AB (ref 11.5–15.5)
WBC: 15.1 10*3/uL — ABNORMAL HIGH (ref 4.0–10.5)

## 2014-08-02 LAB — GLUCOSE, CAPILLARY
GLUCOSE-CAPILLARY: 113 mg/dL — AB (ref 70–99)
GLUCOSE-CAPILLARY: 95 mg/dL (ref 70–99)
Glucose-Capillary: 112 mg/dL — ABNORMAL HIGH (ref 70–99)
Glucose-Capillary: 113 mg/dL — ABNORMAL HIGH (ref 70–99)
Glucose-Capillary: 111 mg/dL — ABNORMAL HIGH (ref 70–99)
Glucose-Capillary: 152 mg/dL — ABNORMAL HIGH (ref 70–99)

## 2014-08-02 LAB — BASIC METABOLIC PANEL
ANION GAP: 4 — AB (ref 5–15)
BUN: 23 mg/dL (ref 6–23)
CALCIUM: 8.3 mg/dL — AB (ref 8.4–10.5)
CO2: 43 mEq/L (ref 19–32)
CREATININE: 0.39 mg/dL — AB (ref 0.50–1.10)
Chloride: 102 mEq/L (ref 96–112)
GFR calc non Af Amer: >90 mL/min (ref >90)
Glucose, Bld: 129 mg/dL — ABNORMAL HIGH (ref 70–99)
Potassium: 3.4 mEq/L — ABNORMAL LOW (ref 3.7–5.3)
SODIUM: 149 meq/L — AB (ref 137–147)

## 2014-08-02 MED ORDER — AMIODARONE HCL IN DEXTROSE 360-4.14 MG/200ML-% IV SOLN
60.0000 mg/h | INTRAVENOUS | Status: AC
  Administered 2014-08-02 (×2): 60 mg/h via INTRAVENOUS
  Filled 2014-08-02 (×2): qty 200

## 2014-08-02 MED ORDER — AMIODARONE LOAD VIA INFUSION
150.0000 mg | Freq: Once | INTRAVENOUS | Status: AC
  Administered 2014-08-02: 150 mg via INTRAVENOUS
  Filled 2014-08-02: qty 83.34

## 2014-08-02 MED ORDER — PHENYLEPHRINE HCL 10 MG/ML IJ SOLN
30.0000 ug/min | INTRAVENOUS | Status: DC
  Administered 2014-08-02: 80 ug/min via INTRAVENOUS
  Administered 2014-08-03: 75 ug/min via INTRAVENOUS
  Filled 2014-08-02 (×3): qty 4

## 2014-08-02 MED ORDER — DILTIAZEM HCL 100 MG IV SOLR
5.0000 mg/h | INTRAVENOUS | Status: DC
  Filled 2014-08-02: qty 100

## 2014-08-02 MED ORDER — AMIODARONE HCL IN DEXTROSE 360-4.14 MG/200ML-% IV SOLN
30.0000 mg/h | INTRAVENOUS | Status: DC
  Administered 2014-08-03: 30 mg/h via INTRAVENOUS
  Filled 2014-08-02 (×3): qty 200

## 2014-08-02 MED ORDER — METOPROLOL TARTRATE 25 MG/10 ML ORAL SUSPENSION
12.5000 mg | Freq: Two times a day (BID) | ORAL | Status: DC
  Administered 2014-08-02 (×2): 12.5 mg
  Filled 2014-08-02 (×5): qty 5

## 2014-08-02 MED ORDER — SODIUM CHLORIDE 0.9 % IV BOLUS (SEPSIS)
1000.0000 mL | Freq: Once | INTRAVENOUS | Status: AC
  Administered 2014-08-02: 1000 mL via INTRAVENOUS

## 2014-08-02 MED ORDER — PHENYLEPHRINE HCL 10 MG/ML IJ SOLN
30.0000 ug/min | INTRAVENOUS | Status: DC
  Administered 2014-08-02 (×2): 75 ug/min via INTRAVENOUS
  Filled 2014-08-02 (×2): qty 1

## 2014-08-02 MED ORDER — DILTIAZEM LOAD VIA INFUSION
10.0000 mg | Freq: Once | INTRAVENOUS | Status: DC
  Filled 2014-08-02: qty 10

## 2014-08-02 MED ORDER — FERROUS SULFATE 300 (60 FE) MG/5ML PO SYRP
300.0000 mg | ORAL_SOLUTION | Freq: Two times a day (BID) | ORAL | Status: DC
  Administered 2014-08-02 – 2014-08-18 (×32): 300 mg
  Filled 2014-08-02 (×34): qty 5

## 2014-08-02 NOTE — Progress Notes (Signed)
eLink Physician-Brief Progress Note Patient Name: Elizabeth [name] W Khun DOB: 1956-05-04 MRN: [id number]   Date of Service  08/02/2014  HPI/Events of Note  Hypotension prior to cardizem being given.  eICU Interventions  D/C cardizem. IVF 1L NS. STAT CXR. Bedside MD to evaluate. Both CT to suction.        Marria Mathison 08/02/2014, 5:05 PM

## 2014-08-02 NOTE — Op Note (Signed)
NAMSimone Curia:  [name], [name]               ACCOUNT NO.:  [id number]  MEDICAL RECORD NO.:  [id number]  LOCATION:  2M10C                        FACILITY:  [facility]  PHYSICIAN:  Nelda Bucksaniel J Feinstein, MD DATE OF BIRTH:  07-16-1956  DATE OF PROCEDURE:  07/29/2014 DATE OF DISCHARGE:                              OPERATIVE REPORT   PROCEDURE:  Percutaneous tracheostomy.  Consent was obtained from the patient and the patient's husband.  Fully aware of risks and benefits of procedure including infection, bleeding, pneumothorax, and death.  PREOPERATIVE DIAGNOSES:  Severe chronic obstructive pulmonary disease with bilateral pneumothoraces, status post mucous plugging and cardiac arrest.  POSTOPERATIVE DIAGNOSES:  Status post tracheostomy secondary to bilateral pneumothoraces and chronic obstructive pulmonary disease, acute respiratory failure.  DESCRIPTION OF PROCEDURE:  The patient was placed in supine position. The PEEP was reduced to 0 before the procedure as the bronchoscope was utilized by the bronchoscopist for direct visualization and we would not want to increase mean airway pressure.  The patient required sedation with Versed and fentanyl.  Chlorhexidine preparation was used to sterilize the operative site under direct visualization by the bronchoscopist.  The entire procedure was visualized intratracheal.  A 1 cm vertical incision was made over the second endotracheal space after lidocaine plus epinephrine of 6 mL was injected.  Dissection was made down to the tracheal planes dissecting through the strap muscles easily. There was some thyroid tissue significantly noted to the right of the wound.  An 18-gauge needle was placed directly into the airway without any injury and the wire was placed through the 18-gauge needle over the white catheter sheath after needle was removed and then the white catheter sheath was removed after the wire was placed.  I then placed a 14-French punch dilator in  and out of the airway over the wire and then placed a progressive rhino dilator over a glider in and out of the airway to approximately 30-French.  The glider and wire remained.  The dilator was removed.  A 6 Shiley tracheostomy was placed over 26-French dilator over the glider and wire successfully into the airway. Everything was removed except the tracheostomy.  The tracheostomy was sutured into placed with 4 monofilament sutures.  Blood loss for the procedure was less than 5 mL.  The patient tolerated the procedure quite well.  Postoperative chest x-ray revealed a well-placed tracheostomy, no apparent complications.  This patient could follow up in our Percutaneous Tracheostomy Clinic by calling #[phone] or by Dr. Delton CoombesByrum at his discretion.  The patient tolerated the procedure quite well.     Nelda Bucksaniel J Feinstein, MD     DJF/MEDQ  D:  08/01/2014  T:  08/02/2014  Job:  [id number]  cc:   Leslye Peerobert S. Byrum, MD

## 2014-08-02 NOTE — Progress Notes (Signed)
Discussed with Doctor Byrum,Orders to D/C central line, left chest tube and foley. However no ordered entered in computer. Per Dr Molli KnockYacoub, leave all 3 in and have Dr Delton CoombesByrum readdress in am

## 2014-08-02 NOTE — Progress Notes (Signed)
  Subjective: Patient examined, CXR and last CT chest reviewed Severe end stage COPD with bilateral chest tubes for vent associated pneumothoraces- min air leaks and good tube positions bilaterally Now on Fi02 .40 tachycardic 120-130/min  Objective: Vital signs in last 24 hours: Temp:  [98 F (36.7 C)-99.1 F (37.3 C)] 98.5 F (36.9 C) (10/06 0828) Pulse Rate:  [97-141] 135 (10/06 0756) Cardiac Rhythm:  [-] Sinus tachycardia;Normal sinus rhythm (10/05 2030) Resp:  [11-22] 12 (10/06 0756) BP: (82-143)/(46-79) 119/62 mmHg (10/06 0756) SpO2:  [95 %-100 %] 97 % (10/06 0756) FiO2 (%):  [40 %] 40 % (10/06 0756) Weight:  [165 lb 2 oz (74.9 kg)] 165 lb 2 oz (74.9 kg) (10/06 0600)  Hemodynamic parameters for last 24 hours:  afebrile Sputum cult negative  Intake/Output from previous day: 10/05 0701 - 10/06 0700 In: 3230 [I.V.:1035; NG/GT:2195] Out: 1015 [Urine:1015] Intake/Output this shift:    Exam Sedated on vent w/trach Distant breath sounds  Lab Results:  Recent Labs  08/01/14 0530 08/02/14 0525  WBC 15.0* 12.5*  HGB 7.0* 6.8*  HCT 23.9* 22.9*  PLT 300 289   BMET:  Recent Labs  08/01/14 0530 08/02/14 0525  NA 148* 149*  K 3.9 3.4*  CL 104 102  CO2 41* 43*  GLUCOSE 131* 129*  BUN 24* 23  CREATININE 0.39* 0.39*  CALCIUM 8.3* 8.3*    PT/INR: No results found for this basename: LABPROT, INR,  in the last 72 hours ABG    Component Value Date/Time   PHART 7.512* 07/28/2014 1204   HCO3 39.8* 07/28/2014 1204   TCO2 41 07/28/2014 1204   O2SAT 98.0 07/28/2014 1204   CBG (last 3)   Recent Labs  08/01/14 2042 08/02/14 0012 08/02/14 0407  GLUCAP 140* 112* 113*    Assessment/Plan: S/P  trach, recurrent VDRF Would not rec VATS for bleb resection- would leave patient in  worse condition rec slowly weaning  suctuion on tubes as tolerated but keeping lungs fully inflated Endobronchial valve could be an option for persistent large air leak   LOS: 13 days     VAN TRIGT III,Ettamae Barkett 08/02/2014

## 2014-08-02 NOTE — Progress Notes (Signed)
Sudden onset hypotension and afib rvr. Sending stat CBC. Last hemoglobin 6.8 as of 5am. No active signs of bleeding

## 2014-08-02 NOTE — Progress Notes (Signed)
eLink Physician-Brief Progress Note Patient Name: Dionne Bucyeresa W Svec DOB: 01/29/1956 MRN: [id number]   Date of Service  08/02/2014  HPI/Events of Note  A-fib with RVR, stable BP.  eICU Interventions  Cardizem drip.     Intervention Category Major Interventions: Other:  Lisett Dirusso 08/02/2014, 4:57 PM

## 2014-08-02 NOTE — Progress Notes (Signed)
eLink Physician-Brief Progress Note Patient Name: Elizabeth [name] DOB: May 28, 1956 MRN: [id number]   Date of Service  08/02/2014  HPI/Events of Note  Iron deficiency anemia.  eICU Interventions  Add feosol     Intervention Category Major Interventions: Other:  Shaunak Kreis 08/02/2014, 6:22 AM

## 2014-08-02 NOTE — Progress Notes (Signed)
[facility], Result taken by: Marisue IvanLiz, RN  Critical Lab: Hgb 6.8   Time Called: 0621  Marvia PicklesJames, Nelia Rogoff Sara, RN

## 2014-08-02 NOTE — Progress Notes (Signed)
New onset afib RVR- elink contacted for orders, obtaining ekg

## 2014-08-02 NOTE — Progress Notes (Signed)
PULMONARY / CRITICAL CARE MEDICINE   Name: Elizabeth [name] MRN: [id number] DOB: 08/13/1956    ADMISSION DATE:  07/20/2014  REFERRING MD :  Ranae PalmsYelverton, ED  CHIEF COMPLAINT:  resp distress  INITIAL PRESENTATION: 58 y/o F with severe COPD on home O2, 3rd  admission in last 2 months who presented 9/23 with AECOPD and bilateral pneumothoraces requiring mechanical ventilation.   STUDIES:   SIGNIFICANT EVENTS: 9/23  Bl chest tubes in ED 9/24  Failed SBT > tachycardic, diaphoretic 9/25  Failed SBT again 9/26  Self extubated and re tubed 9/28  Some weaning high PS 9/30  CT's to -20 cm sxn, no air leak, failed SBT in am  10/1 am hrs>>>mucous plug with pea 6 min arrest  SUBJECTIVE:   Up to chair and on ATC  VITAL SIGNS: Temp:  [98 F (36.7 C)-99.1 F (37.3 C)] 98.5 F (36.9 C) (10/06 0828) Pulse Rate:  [97-141] 135 (10/06 0756) Resp:  [11-22] 12 (10/06 0756) BP: (82-143)/(46-79) 119/62 mmHg (10/06 0756) SpO2:  [95 %-100 %] 97 % (10/06 0756) FiO2 (%):  [40 %] 40 % (10/06 0756) Weight:  [74.9 kg (165 lb 2 oz)] 74.9 kg (165 lb 2 oz) (10/06 0600)  VENTILATOR SETTINGS: Vent Mode:  [-] PRVC FiO2 (%):  [40 %] 40 % Set Rate:  [12 bmp] 12 bmp Vt Set:  [450 mL] 450 mL PEEP:  [3 cmH20] 3 cmH20 Pressure Support:  [12 cmH20] 12 cmH20 Plateau Pressure:  [16 cmH20-21 cmH20] 17 cmH20  INTAKE / OUTPUT:  Intake/Output Summary (Last 24 hours) at 08/02/14 [id number] Last data filed at 08/02/14 0600  Gross per 24 hour  Intake   3000 ml  Output    790 ml  Net   2210 ml    PHYSICAL EXAMINATION: Gen: up to chair, mild anxiety > better HEENT: trach site clean PULM: decreased bs, no wheeze, no air leak Lt, + air leak Rt CV: regular AB: soft, non tender Ext: chest, upper extremity creptius Neuro: RASS 0, normal strength   LABS: PULMONARY  Recent Labs Lab 07/28/14 1204  PHART 7.512*  PCO2ART 49.4*  PO2ART 94.0  HCO3 39.8*  TCO2 41  O2SAT 98.0   CBC  Recent Labs Lab 07/31/14 0627  08/01/14 0530 08/02/14 0525  HGB 7.0* 7.0* 6.8*  HCT 23.6* 23.9* 22.9*  WBC 15.4* 15.0* 12.5*  PLT 308 300 289   COAGULATION  Recent Labs Lab 07/28/14 1200  INR 1.10    CARDIAC  Recent Labs Lab 07/28/14 0429  TROPONINI <0.30   CHEMISTRY  Recent Labs Lab 07/27/14 0458  07/29/14 0430 07/30/14 0350 07/31/14 0627 08/01/14 0530 08/02/14 0525  NA 146  < > 143 145 147 148* 149*  K 3.9  < > 4.0 3.9 4.1 3.9 3.4*  CL 103  < > 99 102 103 104 102  CO2 35*  < > 37* 37* 41* 41* 43*  GLUCOSE 127*  < > 123* 143* 128* 131* 129*  BUN 24*  < > 19 22 22  24* 23  CREATININE 0.46*  < > 0.42* 0.41* 0.40* 0.39* 0.39*  CALCIUM 8.3*  < > 8.4 8.4 8.4 8.3* 8.3*  MG 2.3  --   --   --   --   --   --   PHOS 4.3  --   --   --   --   --   --   < > = values in this interval not displayed. Estimated Creatinine Clearance: 82.9 ml/min (  by C-G formula based on Cr of 0.39).  LIVER  Recent Labs Lab 07/28/14 1200 07/29/14 0430  AST  --  65*  ALT  --  206*  ALKPHOS  --  78  BILITOT  --  1.0  PROT  --  5.2*  ALBUMIN  --  2.7*  INR 1.10  --    INFECTIOUS  Recent Labs Lab 07/27/14 0458  PROCALCITON <0.10   ENDOCRINE CBG (last 3)   Recent Labs  08/02/14 0012 08/02/14 0407 08/02/14 0826  GLUCAP 112* 113* 113*    IMAGING x48h Dg Chest Port 1 View  08/01/2014   CLINICAL DATA:  Recent pneumothorax; chronic obstructive pulmonary disease with difficulty breathing  EXAM: PORTABLE CHEST - 1 VIEW  COMPARISON:  July 31, 2014.  FINDINGS: Tracheostomy tube tip is 8.5 cm above the carina. Central catheter tip is in the superior vena cava. There are chest tubes bilaterally. Feeding tube tip is below the diaphragm. There is a minimal residual pneumothorax on the right, smaller than 1 day prior. There is a minimal apical pneumothorax on the left as well which is felt to be unchanged compared to 1 day prior. There is extensive subcutaneous emphysema bilaterally.  There is underlying emphysema.  There is mild bibasilar atelectatic change. Elsewhere lungs are clear. Heart size and pulmonary vascularity are normal. No adenopathy.  IMPRESSION: Chest tubes bilaterally with minimal pneumothoraces bilaterally. Extensive subcutaneous air remains. Mild bibasilar atelectasis. Underlying emphysema. No change in cardiac silhouette. Tube and catheter positions as described.   Electronically Signed   By: Bretta Bang M.D.   On: 08/01/2014 07:37    ASSESSMENT / PLAN:  PULMONARY OETT 9/23 >> 9/26, 9/26 >> 10/02 Trach (DF) 10/02 >>  A:  Acute on chronic hypoxic/hypercapnic respiratory failure 2nd to AECOPD and b/l spontaneous PTX. Hx of GOLD 4 COPD with bullous emphysema. Mucus plug causing respiratory/cardiac arrest 10/1. P:   trach collar as tolerated, goal to 24x7 Appreciated Dr VanTrigt's eval 10/5 > recommends conservative management; hopefully should benefit from the absence of positive pressure if she can stay off MV. Will slowly decrease suction on CT's Change Lt chest tube to waterseal 10/6, may be able to d/c soon Rt chest tube to -20 suction 10/6 and follow CXR F/u CXR Continue Brovana, Pulmicort, Q4 duoneb Steroids to off on 10/7 Assess for cuff down soon  NEUROLOGIC A:   Anxiety. P:   RASS goal: 0 Prn Ativan  CARDIOVASCULAR A:  Hypotension, s/p arrest >> resolved. P:  Monitor hemodynamics  RENAL A:  Hypervolemia >> improved. Hypernatremia P:   Monitor renal fx, urine outpt, electrolytes Goal even fluid balance Added free water 10/5  GASTROINTESTINAL A:   Nutrition. P:   Tube feeds Protonix for SUP Continue miralax  HEMATOLOGIC A:   Anemia of critical illness and chronic disease. P:  F/u CBC SQ heparin for DVT prevention  INFECTIOUS A:   AECOPD >> completed Abx. P:   Monitor clinically  ENDOCRINE A:   Mild Hyperglycemia - no hx DM. Adrenal insufficiency.  P:   SSI Wean off steroids as tolerated starting 10/03 >> change to hydrocort  q12h on 10/05 > goal off on 10/7  Family update: Updated husband 10/04  Interdisciplinary meeting:  CC time 35 minutes.  Levy Pupa, MD, PhD 08/02/2014, 9:22 AM [facility] [phone] or if no answer [phone]

## 2014-08-02 NOTE — Progress Notes (Signed)
Pt placed back on vent on full support due to increased WOB, increased HR. Pt tolerating vent well at this time. No obvious respiratory distress noted. RT will continue to monitor.

## 2014-08-02 NOTE — Progress Notes (Addendum)
NUTRITION FOLLOW UP  Intervention:    Continue Vital AF 1.2 at goal rate of 55 ml/h (1320 ml per day) to provide 1584 kcals, 99 gm protein, 1071 ml free water daily.  Nutrition Dx:   Inadequate oral intake related to inability to eat as evidenced by NPO status, ongoing.  Goal:   Intake to meet >90% of estimated nutrition needs, met.  Monitor:   TF tolerance/adequacy, weight trend, labs, respiratory status.  Assessment:   3rd admission in last 2 months for 58 year old with severe COPD on home oxygen but acute respiratory failure due to COPD exacerbation complicated by bilateral pneumothoraces and mechanical ventilation.  S/P tracheostomy placement on 10/2. Currently on trach collar. Was on vent support last night. MV: 5.4 L/min Temp (24hrs), Avg:98.6 F (37 C), Min:98 F (36.7 C), Max:99.1 F (37.3 C)   TF regimen: Vital AF 1.2 at 55 ml/h (1320 ml per day) providing 1584 kcals, 99 gm protein, 1071 ml free water daily. Patient tolerating well with minimal residuals (10-120 ml).  Height: Ht Readings from Last 1 Encounters:  07/20/14 _0  (1.702 m)    Weight Status:   Wt Readings from Last 1 Encounters:  08/02/14 165 lb 2 oz (74.9 kg)  07/26/14  156 lb 15.5 oz (71.2 kg)  07/20/14  134 lb 0.6 oz (60.8 kg)   Re-estimated needs:  Kcal: 1400-1600 Protein: 85-100 gm Fluid: 1.5 L  Skin: stage 1 pressure ulcer to sacrum  Diet Order: NPO   Intake/Output Summary (Last 24 hours) at 08/02/14 1120 Last data filed at 08/02/14 0900  Gross per 24 hour  Intake   3280 ml  Output    790 ml  Net   2490 ml    Last BM: 10/1   Labs:   Recent Labs Lab 07/27/14 0458  07/31/14 0627 08/01/14 0530 08/02/14 0525  NA 146  < > 147 148* 149*  K 3.9  < > 4.1 3.9 3.4*  CL 103  < > 103 104 102  CO2 35*  < > 41* 41* 43*  BUN 24*  < > 22 24* 23  CREATININE 0.46*  < > 0.40* 0.39* 0.39*  CALCIUM 8.3*  < > 8.4 8.3* 8.3*  MG 2.3  --   --   --   --   PHOS 4.3  --   --   --   --    GLUCOSE 127*  < > 128* 131* 129*  < > = values in this interval not displayed.  CBG (last 3)   Recent Labs  08/02/14 0012 08/02/14 0407 08/02/14 0826  GLUCAP 112* 113* 113*    Scheduled Meds: . antiseptic oral rinse  7 mL Mouth Rinse QID  . arformoterol  15 mcg Nebulization BID  . budesonide  0.5 mg Nebulization BID  . chlorhexidine  15 mL Mouth Rinse BID  . ferrous sulfate  300 mg Per Tube Q12H  . free water  300 mL Per Tube 3 times per day  . heparin  5,000 Units Subcutaneous 3 times per day  . hydrocortisone sod succinate (SOLU-CORTEF) inj  50 mg Intravenous Q12H  . insulin aspart  2 Units Subcutaneous 6 times per day  . ipratropium-albuterol  3 mL Nebulization Q4H  . pantoprazole sodium  40 mg Per Tube Daily  . polyethylene glycol  17 g Oral Daily  . senna-docusate  1 tablet Oral BID    Continuous Infusions: . feeding supplement (VITAL AF 1.2 CAL) 1,000 mL (08/02/14 0040)  .  fentaNYL infusion INTRAVENOUS 250 mcg/hr (08/02/14 0040)    Molli Barrows, RD, LDN, [place] Pager [phone] After Hours Pager [phone]

## 2014-08-03 ENCOUNTER — Inpatient Hospital Stay (HOSPITAL_COMMUNITY): Payer: BC Managed Care – PPO

## 2014-08-03 LAB — POCT I-STAT 3, ART BLOOD GAS (G3+)
Acid-Base Excess: 15 mmol/L — ABNORMAL HIGH (ref 0.0–2.0)
BICARBONATE: 43.8 meq/L — AB (ref 20.0–24.0)
O2 SAT: 93 %
PCO2 ART: 89.5 mmHg — AB (ref 35.0–45.0)
Patient temperature: 98.1
TCO2: 46 mmol/L (ref 0–100)
pH, Arterial: 7.296 — ABNORMAL LOW (ref 7.350–7.450)
pO2, Arterial: 79 mmHg — ABNORMAL LOW (ref 80.0–100.0)

## 2014-08-03 LAB — BASIC METABOLIC PANEL
ANION GAP: 6 (ref 5–15)
BUN: 23 mg/dL (ref 6–23)
CALCIUM: 8.4 mg/dL (ref 8.4–10.5)
CHLORIDE: 96 meq/L (ref 96–112)
CO2: 41 mEq/L (ref 19–32)
Creatinine, Ser: 0.36 mg/dL — ABNORMAL LOW (ref 0.50–1.10)
GFR calc non Af Amer: >90 mL/min (ref >90)
Glucose, Bld: 146 mg/dL — ABNORMAL HIGH (ref 70–99)
Potassium: 3.7 mEq/L (ref 3.7–5.3)
Sodium: 143 mEq/L (ref 137–147)

## 2014-08-03 LAB — GLUCOSE, CAPILLARY
Glucose-Capillary: 116 mg/dL — ABNORMAL HIGH (ref 70–99)
Glucose-Capillary: 121 mg/dL — ABNORMAL HIGH (ref 70–99)
Glucose-Capillary: 132 mg/dL — ABNORMAL HIGH (ref 70–99)
Glucose-Capillary: 141 mg/dL — ABNORMAL HIGH (ref 70–99)
Glucose-Capillary: 147 mg/dL — ABNORMAL HIGH (ref 70–99)
Glucose-Capillary: 184 mg/dL — ABNORMAL HIGH (ref 70–99)

## 2014-08-03 LAB — BLOOD GAS, ARTERIAL
Acid-Base Excess: 15.1 mmol/L — ABNORMAL HIGH (ref 0.0–2.0)
Bicarbonate: 40 mEq/L — ABNORMAL HIGH (ref 20.0–24.0)
DRAWN BY: 24487
FIO2: 40 %
LHR: 12 {breaths}/min
O2 SAT: 98.6 %
PATIENT TEMPERATURE: 98.6
PCO2 ART: 56.3 mmHg — AB (ref 35.0–45.0)
PEEP: 3 cmH2O
TCO2: 41.7 mmol/L (ref 0–100)
VT: 550 mL
pH, Arterial: 7.465 — ABNORMAL HIGH (ref 7.350–7.450)
pO2, Arterial: 114 mmHg — ABNORMAL HIGH (ref 80.0–100.0)

## 2014-08-03 LAB — MAGNESIUM: Magnesium: 2.1 mg/dL (ref 1.5–2.5)

## 2014-08-03 LAB — PHOSPHORUS: Phosphorus: 2.8 mg/dL (ref 2.3–4.6)

## 2014-08-03 MED ORDER — CLONAZEPAM 1 MG PO TABS
1.0000 mg | ORAL_TABLET | Freq: Two times a day (BID) | ORAL | Status: DC
  Administered 2014-08-03 – 2014-08-18 (×31): 1 mg
  Filled 2014-08-03: qty 2
  Filled 2014-08-03 (×4): qty 1
  Filled 2014-08-03: qty 2
  Filled 2014-08-03: qty 1
  Filled 2014-08-03 (×2): qty 2
  Filled 2014-08-03 (×5): qty 1
  Filled 2014-08-03: qty 2
  Filled 2014-08-03: qty 1
  Filled 2014-08-03: qty 2
  Filled 2014-08-03 (×2): qty 1
  Filled 2014-08-03: qty 2
  Filled 2014-08-03 (×5): qty 1
  Filled 2014-08-03: qty 2
  Filled 2014-08-03 (×4): qty 1
  Filled 2014-08-03: qty 2

## 2014-08-03 MED ORDER — METOPROLOL TARTRATE 25 MG/10 ML ORAL SUSPENSION
12.5000 mg | Freq: Two times a day (BID) | ORAL | Status: DC
Start: 2014-08-04 — End: 2014-08-04
  Administered 2014-08-04: 12.5 mg
  Filled 2014-08-03 (×2): qty 5

## 2014-08-03 MED ORDER — FREE WATER
200.0000 mL | Freq: Three times a day (TID) | Status: DC
  Administered 2014-08-03 – 2014-08-04 (×4): 200 mL

## 2014-08-03 MED ORDER — SODIUM CHLORIDE 0.9 % IJ SOLN
10.0000 mL | INTRAMUSCULAR | Status: DC | PRN
  Administered 2014-08-15: 20 mL

## 2014-08-03 MED ORDER — CLONAZEPAM 0.1 MG/ML ORAL SUSPENSION: 1.0000 mg | Freq: Two times a day (BID) | ORAL | Status: DC

## 2014-08-03 MED ORDER — POTASSIUM CHLORIDE 20 MEQ/15ML (10%) PO LIQD
40.0000 meq | Freq: Once | ORAL | Status: AC
  Administered 2014-08-03: 40 meq
  Filled 2014-08-03 (×2): qty 30

## 2014-08-03 MED ORDER — INSULIN ASPART 100 UNIT/ML ~~LOC~~ SOLN
0.0000 [IU] | SUBCUTANEOUS | Status: DC
  Administered 2014-08-03: 4 [IU] via SUBCUTANEOUS
  Administered 2014-08-04 – 2014-08-08 (×11): 3 [IU] via SUBCUTANEOUS

## 2014-08-03 MED ORDER — SODIUM CHLORIDE 0.9 % IJ SOLN
10.0000 mL | Freq: Two times a day (BID) | INTRAMUSCULAR | Status: DC
  Administered 2014-08-03 – 2014-08-09 (×12): 10 mL
  Administered 2014-08-10: 20 mL
  Administered 2014-08-10: 10 mL
  Administered 2014-08-11 (×2): 20 mL
  Administered 2014-08-12 – 2014-08-13 (×3): 10 mL
  Administered 2014-08-14: 20 mL
  Administered 2014-08-14 – 2014-08-17 (×6): 10 mL
  Administered 2014-08-17: 20 mL
  Administered 2014-08-18: 10 mL

## 2014-08-03 NOTE — Progress Notes (Signed)
ABG results called to Dr Craige CottaSood Ph [phone], pCO2 90.6 ... Marvia PicklesJames, Mallory Schaad Sara, RN

## 2014-08-03 NOTE — Progress Notes (Signed)
eLink Physician-Brief Progress Note Patient Name: Elizabeth [name] DOB: 1955-12-23 MRN: [id number]   Date of Service  08/03/2014  HPI/Events of Note   Increased WOB and decreased mental status.  ABG shows respiratory acidosis.     eICU Interventions   Will change Vt to 9 cc/kg.  Keep RR same.      Intervention Category Major Interventions: Other:  Shela Esses 08/03/2014, 12:54 AM

## 2014-08-03 NOTE — Progress Notes (Signed)
[facility], Result taken by: Dr Craige CottaSood  Critical Lab: CO2 41   Time Called: [phone]

## 2014-08-03 NOTE — Progress Notes (Signed)
Peripherally Inserted Central Catheter/Midline Placement  The IV Nurse has discussed with the patient and/or persons authorized to consent for the patient, the purpose of this procedure and the potential benefits and risks involved with this procedure.  The benefits include less needle sticks, lab draws from the catheter and patient may be discharged home with the catheter.  Risks include, but not limited to, infection, bleeding, blood clot (thrombus formation), and puncture of an artery; nerve damage and irregular heat beat.  Alternatives to this procedure were also discussed.  PICC/Midline Placement Documentation    Information given to patient and sister, Doree Barthelnita Morehead.    Franne Gripewman, Mairely Foxworth Renee 08/03/2014, 2:39 PM

## 2014-08-03 NOTE — Progress Notes (Signed)
Noted pt increased PIP, Increased WOB noted, decreased breath sounds on right lung.  Notified Brett CanalesSteve Minor NP- aware.  NP evaluated pt.

## 2014-08-03 NOTE — Progress Notes (Signed)
PULMONARY / CRITICAL CARE MEDICINE   Name: Elizabeth [name] MRN: [id number] DOB: May 13, 1956    ADMISSION DATE:  07/20/2014  REFERRING MD :  Ranae Palms, ED  CHIEF COMPLAINT:  resp distress  INITIAL PRESENTATION: 58 y/o F with severe COPD on home O2, 3rd  admission in last 2 months who presented 9/23 with AECOPD and bilateral pneumothoraces requiring mechanical ventilation.   STUDIES:   SIGNIFICANT EVENTS: 9/23  Bl chest tubes in ED 9/24  Failed SBT > tachycardic, diaphoretic 9/25  Failed SBT again 9/26  Self extubated and re tubed 9/28  Some weaning high PS 9/30  CT's to -20 cm sxn, no air leak, failed SBT in am  10/1 am hrs>>>mucous plug with pea 6 min arrest  SUBJECTIVE:   Eventful night > developed A Fib + RVR around 17:00, received IVF bolus. Started phenylephrine Then noted to be hypercapneic, agitated. Placed back on MV and Vt increased  VITAL SIGNS: Temp:  [98 F (36.7 C)-99 F (37.2 C)] 99 F (37.2 C) (10/07 0832) Pulse Rate:  [44-139] 92 (10/07 0742) Resp:  [12-29] 12 (10/07 0742) BP: (64-152)/(45-107) 132/74 mmHg (10/07 0742) SpO2:  [93 %-100 %] 100 % (10/07 0742) FiO2 (%):  [40 %] 40 % (10/07 0742) Weight:  [76.4 kg (168 lb 6.9 oz)] 76.4 kg (168 lb 6.9 oz) (10/07 0030)  VENTILATOR SETTINGS: Vent Mode:  [-] PRVC FiO2 (%):  [40 %] 40 % Set Rate:  [12 bmp] 12 bmp Vt Set:  [450 mL-550 mL] 550 mL PEEP:  [3 cmH20] 3 cmH20 Plateau Pressure:  [22 cmH20-24 cmH20] 24 cmH20  INTAKE / OUTPUT:  Intake/Output Summary (Last 24 hours) at 08/03/14 0857 Last data filed at 08/03/14 0600  Gross per 24 hour  Intake 5016.96 ml  Output   1180 ml  Net 3836.96 ml    PHYSICAL EXAMINATION: Gen: anxious and ill appearing HEENT: trach site clean PULM: decreased bs, no wheeze, no air leak Lt, + air leak Rt CV: regular AB: soft, non tender Ext: chest, upper extremity creptius Neuro: RASS 0, normal strength   LABS: PULMONARY  Recent Labs Lab 07/28/14 1204  08/03/14 0049 08/03/14 0500  PHART 7.512* 7.296* 7.465*  PCO2ART 49.4* 89.5* 56.3*  PO2ART 94.0 79.0* 114.0*  HCO3 39.8* 43.8* 40.0*  TCO2 41 46 41.7  O2SAT 98.0 93.0 98.6   CBC  Recent Labs Lab 08/01/14 0530 08/02/14 0525 08/02/14 1800  HGB 7.0* 6.8* 7.0*  HCT 23.9* 22.9* 24.7*  WBC 15.0* 12.5* 15.1*  PLT 300 289 301   COAGULATION  Recent Labs Lab 07/28/14 1200  INR 1.10    CARDIAC  Recent Labs Lab 07/28/14 0429  TROPONINI <0.30   CHEMISTRY  Recent Labs Lab 07/30/14 0350 07/31/14 0627 08/01/14 0530 08/02/14 0525 08/03/14 0510  NA 145 147 148* 149* 143  K 3.9 4.1 3.9 3.4* 3.7  CL 102 103 104 102 96  CO2 37* 41* 41* 43* 41*  GLUCOSE 143* 128* 131* 129* 146*  BUN 22 22 24* 23 23  CREATININE 0.41* 0.40* 0.39* 0.39* 0.36*  CALCIUM 8.4 8.4 8.3* 8.3* 8.4  MG  --   --   --   --  2.1  PHOS  --   --   --   --  2.8   Estimated Creatinine Clearance: 80.3 ml/min (by C-G formula based on Cr of 0.36).  LIVER  Recent Labs Lab 07/28/14 1200 07/29/14 0430  AST  --  65*  ALT  --  206*  ALKPHOS  --  78  BILITOT  --  1.0  PROT  --  5.2*  ALBUMIN  --  2.7*  INR 1.10  --    INFECTIOUS No results found for this basename: LATICACIDVEN, PROCALCITON,  in the last 168 hours ENDOCRINE CBG (last 3)   Recent Labs  08/02/14 2029 08/03/14 0016 08/03/14 0356  GLUCAP 152* 121* 147*    IMAGING x48h Dg Chest Port 1 View  08/02/2014   CLINICAL DATA:  Shortness of breath, hypotension  EXAM: PORTABLE CHEST - 1 VIEW  COMPARISON:  08/02/2014  FINDINGS: Cardiomediastinal silhouette is stable. Bilateral chest tubes are unchanged in position. Extensive subcutaneous emphysema chest wall again noted. No acute infiltrate or pulmonary edema. Stable small bilateral apical pneumothorax. Tracheostomy tube is unchanged in position. Stable NG tube position. Stable left IJ central line position.  IMPRESSION: Stable support apparatus. Bilateral chest tubes are unchanged in position.  Again noted bilateral small apical pneumothorax.   Electronically Signed   By: Natasha Mead M.D.   On: 08/02/2014 17:31   Dg Chest Port 1 View  08/02/2014   CLINICAL DATA:  Evaluate pneumothorax.  Shortness of breath.  EXAM: PORTABLE CHEST - 1 VIEW  COMPARISON:  Chest radiograph 08/01/2014  FINDINGS: Tracheostomy tube terminates within the mid trachea. Enteric tube courses inferior to the diaphragm, tip not included on this evaluation. Left internal jugular approach central venous catheter tip projects over the superior vena cava. Subcutaneous emphysema.  Stable cardiac and mediastinal contours. Persistent bilateral coarse interstitial pulmonary opacities. Bilateral chest tubes are visualized. There is suggestion of kinking of the mid aspect of the left chest tube in the region of the lateral chest wall soft tissues. Residual tiny biapical pneumothoraces, grossly similar given differences in patient positioning.  IMPRESSION: Grossly similar biapical pneumothoraces given differences in patient positioning.  Bilateral chest tubes remain in place. There is suggestion of kinking of the left chest tube along the mid aspect of the left lateral chest wall.  Extensive subcutaneous emphysema.  These results will be called to the ordering clinician or representative by the Radiologist Assistant, and communication documented in the PACS or zVision Dashboard.   Electronically Signed   By: Annia Belt M.D.   On: 08/02/2014 13:10    ASSESSMENT / PLAN:  PULMONARY OETT 9/23 >> 9/26, 9/26 >> 10/02 Trach (DF) 10/02 >>  A:  Acute on chronic hypoxic/hypercapnic respiratory failure 2nd to AECOPD and b/l spontaneous PTX. Hx of GOLD 4 COPD with bullous emphysema. Mucus plug causing respiratory/cardiac arrest 10/1. P:   trach collar as tolerated, did not tolerate 24x7. Will have to go back to PSV weaning, will likely require at night (possible for ever).  Appreciated Dr VanTrigt's eval 10/5 > recommends conservative  management; hopefully should benefit from the absence of positive pressure if she can stay off MV. Will slowly decrease suction on CT's Lt chest tube to waterseal 10/6, then back to suction -20 when she decompensated. Try waterseal again 10/7 and check CXR this pm Increased R PTX after decrease Rt chest tube to -20 suction on 10/6. Will place back on -40 on 10/7 F/u CXR Continue Brovana, Pulmicort, Q4 duoneb Steroids to off on 10/7  NEUROLOGIC A:   Anxiety. P:   RASS goal: 0 Add scheduled clonazepam and increase prn ativan  CARDIOVASCULAR A:  Hypotension, s/p arrest >> resolved. A Fib + RVR, started amiodarone 10/7 P:  Wean phenylephrine 10/7 D/c amiodarone, suspect her A Fib was driven by resp distress  and failure Try to restart metoprolol on 10/8 if BP will allow  RENAL A:  Hypervolemia >> improved. Hypernatremia P:   Monitor renal fx, urine outpt, electrolytes Goal even fluid balance Added free water 10/5, decrease 10/7  GASTROINTESTINAL A:   Nutrition. P:   Tube feeds Protonix for SUP Continue miralax  HEMATOLOGIC A:   Anemia of critical illness and chronic disease. P:  F/u CBC SQ heparin for DVT prevention  INFECTIOUS A:   AECOPD >> completed Abx. P:   Monitor clinically  ENDOCRINE A:   Mild Hyperglycemia - no hx DM. Adrenal insufficiency.  P:   SSI Wean off steroids as tolerated starting 10/03 >> change to hydrocort q12h on 10/05 > goal off on 10/7  Family update: Updated husband 10/04  Interdisciplinary meeting:  CC time 35 minutes.  Levy Pupaobert Ceyda Peterka, MD, PhD 08/03/2014, 8:57 AM [facility] [phone] or if no answer [phone]

## 2014-08-03 NOTE — Progress Notes (Signed)
Pt more lethargic; appears to have increased WOB, pursed lip breathing; more frequent muscle twitching noted; e-link Dr Craige CottaSood notified; order for ABG received ... Marvia PicklesJames, Toniya Rozar Sara, RN

## 2014-08-04 ENCOUNTER — Inpatient Hospital Stay (HOSPITAL_COMMUNITY): Payer: BC Managed Care – PPO

## 2014-08-04 ENCOUNTER — Ambulatory Visit: Payer: BC Managed Care – PPO | Admitting: Internal Medicine

## 2014-08-04 LAB — CBC
HEMATOCRIT: 23.5 % — AB (ref 36.0–46.0)
HEMOGLOBIN: 7 g/dL — AB (ref 12.0–15.0)
MCH: 29.7 pg (ref 26.0–34.0)
MCHC: 29.8 g/dL — ABNORMAL LOW (ref 30.0–36.0)
MCV: 99.6 fL (ref 78.0–100.0)
Platelets: 291 10*3/uL (ref 150–400)
RBC: 2.36 MIL/uL — AB (ref 3.87–5.11)
RDW: 16.4 % — ABNORMAL HIGH (ref 11.5–15.5)
WBC: 14.5 10*3/uL — AB (ref 4.0–10.5)

## 2014-08-04 LAB — BASIC METABOLIC PANEL
Anion gap: 7 (ref 5–15)
BUN: 22 mg/dL (ref 6–23)
CHLORIDE: 99 meq/L (ref 96–112)
CO2: 34 meq/L — AB (ref 19–32)
CREATININE: 0.4 mg/dL — AB (ref 0.50–1.10)
Calcium: 7.4 mg/dL — ABNORMAL LOW (ref 8.4–10.5)
GFR calc Af Amer: >90 mL/min (ref >90)
GFR calc non Af Amer: >90 mL/min (ref >90)
GLUCOSE: 105 mg/dL — AB (ref 70–99)
Potassium: 3.4 mEq/L — ABNORMAL LOW (ref 3.7–5.3)
Sodium: 140 mEq/L (ref 137–147)

## 2014-08-04 LAB — PHOSPHORUS: Phosphorus: 2.8 mg/dL (ref 2.3–4.6)

## 2014-08-04 LAB — GLUCOSE, CAPILLARY
GLUCOSE-CAPILLARY: 119 mg/dL — AB (ref 70–99)
GLUCOSE-CAPILLARY: 146 mg/dL — AB (ref 70–99)
GLUCOSE-CAPILLARY: 130 mg/dL — AB (ref 70–99)
GLUCOSE-CAPILLARY: 114 mg/dL — AB (ref 70–99)
Glucose-Capillary: 145 mg/dL — ABNORMAL HIGH (ref 70–99)
Glucose-Capillary: 148 mg/dL — ABNORMAL HIGH (ref 70–99)

## 2014-08-04 LAB — MAGNESIUM: Magnesium: 1.8 mg/dL (ref 1.5–2.5)

## 2014-08-04 MED ORDER — MAGNESIUM SULFATE 40 MG/ML IJ SOLN
2.0000 g | Freq: Once | INTRAMUSCULAR | Status: AC
  Administered 2014-08-04: 2 g via INTRAVENOUS
  Filled 2014-08-04: qty 50

## 2014-08-04 MED ORDER — POTASSIUM CHLORIDE 20 MEQ/15ML (10%) PO LIQD
40.0000 meq | Freq: Once | ORAL | Status: AC
  Administered 2014-08-04: 40 meq
  Filled 2014-08-04 (×2): qty 30

## 2014-08-04 MED ORDER — FREE WATER
200.0000 mL | Freq: Two times a day (BID) | Status: DC
  Administered 2014-08-04 – 2014-08-07 (×6): 200 mL

## 2014-08-04 NOTE — Progress Notes (Signed)
Trach consult completed at this time. Pt remains on full vent support. Elizabeth [name] was placed on 9/23, sutures remain in. Bedside RT, Mary, called and made aware. Will continue to follow for pt progress. Unsure what discharge plan is at this time. No education needed. Will continue to follow for pt progress.

## 2014-08-04 NOTE — Trach Care Team (Signed)
Trach Care Progression Note   Patient Details Name: Elizabeth [name] MRN: [id number] DOB: February 23, 1956 Today's Date: 08/04/2014   Tracheostomy Assessment    Tracheostomy Shiley 6 mm (Active)  Status Secured 08/04/2014  1:44 PM  Site Assessment Clean;Dry 08/04/2014 12:41 PM  Site Care Cleansed 08/04/2014  6:51 AM  Inner Cannula Care Changed/new 08/04/2014  6:51 AM  Ties Assessment Secure 08/04/2014  1:44 PM  Cuff pressure (cm) 28 cm 08/04/2014  3:28 AM  Emergency Equipment at bedside Yes 08/04/2014  1:44 PM     Care Needs Suture removal post-op day #7 :  (sutures are due to come out. Mary RT made aware )   Respiratory Therapy O2 Device: Ventilator FiO2 (%): 40 % SpO2: 99 %    Speech Language Pathology  SLP chart review complete: Not ready for SLP services at present.   Physical Therapy      Occupational Therapy      Nutritional Patient's Current Diet: Tube feeding Tube Feeding: Vital AF 1.2 Cal Tube Feeding Frequency: Continuous Tube Feeding Strength: Full strength    Case Management/Social Work [employer] vs., vent [employer]ures [employer]nf    Provider Trach Care Team Recommendations  [facility] Members -  Harlon DittyBonnie DeBlois, SLP,  Gretta CoolZackary Brooks, SW, Cherylin MylarLauren Doyle, [place]RT    SWconsult for [employer]ACH          Marylon Verno, Silva BandyDebra Anita (scribe for team) 08/04/2014, 2:39 PM

## 2014-08-04 NOTE — Progress Notes (Signed)
PULMONARY / CRITICAL CARE MEDICINE   Name: Dionne Bucyeresa W Mclinden MRN: [id number] DOB: 07/02/1956    ADMISSION DATE:  07/20/2014  REFERRING MD :  Ranae PalmsYelverton, ED  CHIEF COMPLAINT:  resp distress  INITIAL PRESENTATION: 58 y/o F with severe COPD on home O2, 3rd  admission in last 2 months who presented 9/23 with AECOPD and bilateral pneumothoraces requiring mechanical ventilation.   STUDIES:   SIGNIFICANT EVENTS: 9/23  Bl chest tubes in ED 9/24  Failed SBT > tachycardic, diaphoretic 9/25  Failed SBT again 9/26  Self extubated and re tubed 9/28  Some weaning high PS 9/30  CT's to -20 cm sxn, no air leak, failed SBT in am  10/1 am hrs>>>mucous plug with pea 6 min arrest  SUBJECTIVE:   Has remained on MV overnight, no ATC yet today L CT placed on water seal 10/7  VITAL SIGNS: Temp:  [98.1 F (36.7 C)-99.1 F (37.3 C)] 98.2 F (36.8 C) (10/08 0810) Pulse Rate:  [105-130] 116 (10/08 1241) Resp:  [12-27] 12 (10/08 1241) BP: (64-129)/(42-89) 81/60 mmHg (10/08 1100) SpO2:  [94 %-100 %] 99 % (10/08 1344) FiO2 (%):  [40 %] 40 % (10/08 1344) Weight:  [78.7 kg (173 lb 8 oz)] 78.7 kg (173 lb 8 oz) (10/08 0500)  VENTILATOR SETTINGS: Vent Mode:  [-] PRVC FiO2 (%):  [40 %] 40 % Set Rate:  [12 bmp] 12 bmp Vt Set:  [550 mL] 550 mL PEEP:  [3 cmH20] 3 cmH20 Plateau Pressure:  [25 cmH20-32 cmH20] 28 cmH20  INTAKE / OUTPUT:  Intake/Output Summary (Last 24 hours) at 08/04/14 1455 Last data filed at 08/04/14 1100  Gross per 24 hour  Intake 2076.3 ml  Output    450 ml  Net 1626.3 ml    PHYSICAL EXAMINATION: Gen: anxious and ill appearing HEENT: trach site clean PULM: decreased bs, no wheeze, no air leak Lt, + air leak Rt CV: regular AB: soft, non tender Ext: chest, upper extremity creptius Neuro: RASS 0, normal strength   LABS: PULMONARY  Recent Labs Lab 08/03/14 0049 08/03/14 0500  PHART 7.296* 7.465*  PCO2ART 89.5* 56.3*  PO2ART 79.0* 114.0*  HCO3 43.8* 40.0*  TCO2 46 41.7   O2SAT 93.0 98.6   CBC  Recent Labs Lab 08/02/14 0525 08/02/14 1800 08/04/14 0500  HGB 6.8* 7.0* 7.0*  HCT 22.9* 24.7* 23.5*  WBC 12.5* 15.1* 14.5*  PLT 289 301 291   COAGULATION No results found for this basename: INR,  in the last 168 hours  CARDIAC No results found for this basename: TROPONINI,  in the last 168 hours CHEMISTRY  Recent Labs Lab 07/31/14 0627 08/01/14 0530 08/02/14 0525 08/03/14 0510 08/04/14 0500  NA 147 148* 149* 143 140  K 4.1 3.9 3.4* 3.7 3.4*  CL 103 104 102 96 99  CO2 41* 41* 43* 41* 34*  GLUCOSE 128* 131* 129* 146* 105*  BUN 22 24* 23 23 22   CREATININE 0.40* 0.39* 0.39* 0.36* 0.40*  CALCIUM 8.4 8.3* 8.3* 8.4 7.4*  MG  --   --   --  2.1 1.8  PHOS  --   --   --  2.8 2.8   Estimated Creatinine Clearance: 81.4 ml/min (by C-G formula based on Cr of 0.4).  LIVER  Recent Labs Lab 07/29/14 0430  AST 65*  ALT 206*  ALKPHOS 78  BILITOT 1.0  PROT 5.2*  ALBUMIN 2.7*   INFECTIOUS No results found for this basename: LATICACIDVEN, PROCALCITON,  in the last 168 hours  ENDOCRINE CBG (last 3)   Recent Labs  08/04/14 0401 08/04/14 0809 08/04/14 1206  GLUCAP 119* 148* 146*    IMAGING x48h Dg Chest Port 1 View  08/03/2014   CLINICAL DATA:  Subsequent encounter for bilateral pneumothoraces.  EXAM: PORTABLE CHEST - 1 VIEW  COMPARISON:  One-view chest from the same day at 11:35 a.m.  FINDINGS: Bilateral chest tubes are in place. A small residual right apical pneumothorax is stable. A tracheostomy tube is in place. The feeding tube courses off the inferior border of the film. A left IJ catheter is been removed. A new right-sided PICC line is in place. This terminates in the distal SVC, above the cavoatrial junction. Asymmetric right lower lobe airspace disease is present. This is likely exaggerated with some collapse.  IMPRESSION: 1. Persistent bilateral scratch the persistent right pneumothorax with a chest tube in place. 2. Interval placement of  right-sided PICC line and removal of left IJ catheter. The tip of the PICC line is in the distal SVC. 3. Right lower lobe airspace disease. This is likely exaggerated by partial collapse of the right lower lobe associated with the pneumothorax. This is concerning for pneumonia.   Electronically Signed   By: Gennette Pac M.D.   On: 08/03/2014 20:34   Dg Chest Port 1 View  08/03/2014   CLINICAL DATA:  Rest respiratory distress ; acute respiratory failure; history of end-stage COPD.  EXAM: PORTABLE CHEST - 1 VIEW  COMPARISON:  Portable chest x-ray of August 03, 2014 at 4:36 a.m.  FINDINGS: The lungs are hyperinflated. A right-sided pneumothorax is visible and is larger than on the earlier study. No left-sided pneumothorax is visible. The cardiac silhouette is normal in size. The pulmonary vascularity is not engorged. There are increased lung markings at the bases especially on the right. A small amount of pleural fluid on the right is suspected.  Bilateral upper lobe chest tubes are present and stable. The tracheostomy appliance tip lies at approximately the level of the inferior margin of the clavicular heads and is stable. The left internal jugular venous catheter tip overlies the proximal portion of the SVC. The feeding tube tip projects off the inferior margin of the image.  IMPRESSION: 1. COPD. There has been interval increase in the size of the right pneumothorax. It amounts to approximately 20% of the overall right lung volume. There is no mediastinal shift. 2. Persistent bibasilar atelectasis or pneumonia is present. A small right pleural effusion is present. 3. The support tubes and lines are unchanged in position. 4. Critical Value/emergent results were called by telephone at the time of interpretation on 08/03/2014 at 12:06 pm to Leitha Schuller, RN,, who verbally acknowledged these results. She and the patient's position are aware of the findings.   Electronically Signed   By: David  [place]   On: 08/03/2014  12:09   Dg Chest Port 1 View  08/03/2014   CLINICAL DATA:  Followup pneumothorax, shortness of breath, personal history of COPD, hypertension, GERD, hyperlipidemia, former smoker  EXAM: PORTABLE CHEST - 1 VIEW  COMPARISON:  Portable exam 0436 hr compared to 08/02/2014  FINDINGS: Tracheostomy tube, feeding tube, and LEFT jugular line unchanged.  BILATERAL thoracostomy tubes in the upper hemithoraces, unchanged.  Normal heart size, mediastinal contours, and pulmonary vascularity.  Emphysematous and bronchitic changes consistent with COPD.  RIGHT lower lobe infiltrate question pneumonia, new.  Atelectasis versus infiltrate developing at LEFT base.  Small RIGHT basilar pneumothorax identified.  No definite LEFT pneumothorax seen.  Bones demineralized.  IMPRESSION: COPD changes with small RIGHT basilar pneumothorax.  New RIGHT lobe infiltrate with minimal atelectasis or developing infiltrate at LEFT lower lobe.   Electronically Signed   By: Ulyses Southward M.D.   On: 08/03/2014 07:55    ASSESSMENT / PLAN:  PULMONARY OETT 9/23 >> 9/26, 9/26 >> 10/02 Trach (DF) 10/02 >>  A:  Acute on chronic hypoxic/hypercapnic respiratory failure 2nd to AECOPD and b/l spontaneous PTX. Hx of GOLD 4 COPD with bullous emphysema. Mucus plug causing respiratory/cardiac arrest 10/1. P:   Trach collar as tolerated, did not tolerate 24x7. Will have to go back to PSV weaning, will likely require at night (possible forever).  Appreciated Dr VanTrigt's eval 10/5 > recommends conservative management; hopefully should benefit from the absence of positive pressure if she can stay off MV. Will slowly decrease suction on R CT Increased R PTX after decrease Rt chest tube to -20 suction on 10/6. Will place back on -40 on 10/7 Lt chest tube to waterseal 10/7, will remove 10/8 F/u CXR Continue Brovana, Pulmicort, Q4 duoneb Steroids off on 10/7  NEUROLOGIC A:   Anxiety. P:   RASS goal: 0 Scheduled clonazepam and prn  ativan  CARDIOVASCULAR A:  Hypotension, s/p arrest >> resolved. A Fib + RVR, started amiodarone 10/7 P:  Wean phenylephrine to off D/c'd amiodarone, suspect her A Fib was driven by resp distress and failure Try to restart metoprolol on 10/8 if BP will allow  RENAL A:  Hypervolemia >> improved. Hypernatremia P:   Monitor renal fx, urine outpt, electrolytes Goal even fluid balance Added free water 10/5, decrease 10/7 and 10/8  GASTROINTESTINAL A:   Nutrition. P:   Tube feeds Protonix for SUP Continue miralax  HEMATOLOGIC A:   Anemia of critical illness and chronic disease. P:  F/u CBC SQ heparin for DVT prevention  INFECTIOUS A:   AECOPD >> completed Abx. P:   Monitor clinically  ENDOCRINE A:   Mild Hyperglycemia - no hx DM. Adrenal insufficiency.  P:   SSI Steroids weaned to off  Family update: Updated husband 10/08  Interdisciplinary meeting:  CC time 35 minutes.  Levy Pupa, MD, PhD 08/04/2014, 2:55 PM [facility] [phone] or if no answer [phone]

## 2014-08-04 NOTE — Progress Notes (Signed)
Pt came off the vent for a moment trying to get out of bad. Pt desaturated in the 70's but came back up on 100% O2 breaths. Pt is now stable on the vent. RT will continue to monitor.

## 2014-08-04 NOTE — Progress Notes (Deleted)
      [address]       [place] [id number]             [phone]            Subjective: Patient on trach collar  Objective: Vital signs in last 24 hours: Temp:  [98.1 F (36.7 C)-99.1 F (37.3 C)] 98.6 F (37 C) (10/08 0402) Pulse Rate:  [86-130] 118 (10/08 0600) Cardiac Rhythm:  [-] Sinus tachycardia (10/08 0000) Resp:  [12-27] 14 (10/08 0600) BP: (64-140)/(42-89) 90/58 mmHg (10/08 0600) SpO2:  [91 %-100 %] 98 % (10/08 0600) FiO2 (%):  [40 %] 40 % (10/08 0500) Weight:  [173 lb 8 oz (78.7 kg)] 173 lb 8 oz (78.7 kg) (10/08 0500)     Intake/Output from previous day: 10/07 0701 - 10/08 0700 In: 2438.6 [I.V.:683.6; NG/GT:1755] Out: 775 [Urine:775]   Physical Exam:  Cardiovascular:Tachycardic Pulmonary: Coarse breath sounds bilaterally Abdomen: Soft, non tender, bowel sounds present. Wounds: Dressings clean and dry. f  Chest Tubes: No air leak  Lab Results: CBC: Recent Labs  08/02/14 1800 08/04/14 0500  WBC 15.1* 14.5*  HGB 7.0* 7.0*  HCT 24.7* 23.5*  PLT 301 291   BMET:  Recent Labs  08/03/14 0510 08/04/14 0500  NA 143 140  K 3.7 3.4*  CL 96 99  CO2 41* 34*  GLUCOSE 146* 105*  BUN 23 22  CREATININE 0.36* 0.40*  CALCIUM 8.4 7.4*    PT/INR: No results found for this basename: LABPROT, INR,  in the last 72 hours ABG:  INR: Will add last result for INR, ABG once components are confirmed Will add last 4 CBG results once components are confirmed  Assessment/Plan:  1. CV - ST 2.  Pulmonary - Acute on chronic respiratory failure. Mucous plug causing arrest 10/01.Chest tubes with minor output.  Right chest tube is to suction and left chest tube is to water seal.There is no air leak. CXR this a shows stable, tiny bilateral pneumothoraces and bilateral subcutaneous emphysema. Will discuss chest tube management with Dr. Donata ClayVan Trigt. 3. Anemia-H and H 7 and 23.5 4. Hypokalemia-supplement per CCM  ZIMMERMAN,DONIELLE  MPA-C 08/04/2014,8:05 AM

## 2014-08-05 LAB — GLUCOSE, CAPILLARY
GLUCOSE-CAPILLARY: 104 mg/dL — AB (ref 70–99)
GLUCOSE-CAPILLARY: 119 mg/dL — AB (ref 70–99)
GLUCOSE-CAPILLARY: 122 mg/dL — AB (ref 70–99)
GLUCOSE-CAPILLARY: 148 mg/dL — AB (ref 70–99)
Glucose-Capillary: 146 mg/dL — ABNORMAL HIGH (ref 70–99)
Glucose-Capillary: 118 mg/dL — ABNORMAL HIGH (ref 70–99)

## 2014-08-05 LAB — COMPREHENSIVE METABOLIC PANEL
ALBUMIN: 2.4 g/dL — AB (ref 3.5–5.2)
ALT: 89 U/L — ABNORMAL HIGH (ref 0–35)
AST: 40 U/L — ABNORMAL HIGH (ref 0–37)
Alkaline Phosphatase: 90 U/L (ref 39–117)
Anion gap: 5 (ref 5–15)
BUN: 24 mg/dL — ABNORMAL HIGH (ref 6–23)
CO2: 40 mEq/L (ref 19–32)
Calcium: 8.3 mg/dL — ABNORMAL LOW (ref 8.4–10.5)
Chloride: 95 mEq/L — ABNORMAL LOW (ref 96–112)
Creatinine, Ser: 0.42 mg/dL — ABNORMAL LOW (ref 0.50–1.10)
GFR calc Af Amer: >90 mL/min (ref >90)
GFR calc non Af Amer: >90 mL/min (ref >90)
Glucose, Bld: 147 mg/dL — ABNORMAL HIGH (ref 70–99)
Potassium: 4.3 mEq/L (ref 3.7–5.3)
Sodium: 140 mEq/L (ref 137–147)
Total Bilirubin: 0.9 mg/dL (ref 0.3–1.2)
Total Protein: 5.1 g/dL — ABNORMAL LOW (ref 6.0–8.3)

## 2014-08-05 LAB — PHOSPHORUS: Phosphorus: 3.6 mg/dL (ref 2.3–4.6)

## 2014-08-05 LAB — MAGNESIUM: MAGNESIUM: 2.1 mg/dL (ref 1.5–2.5)

## 2014-08-05 NOTE — Progress Notes (Signed)
PULMONARY / CRITICAL CARE MEDICINE   Name: Elizabeth [name] MRN: [id number] DOB: 10/14/1956    ADMISSION DATE:  07/20/2014  REFERRING MD :  Ranae PalmsYelverton, ED  CHIEF COMPLAINT:  resp distress  INITIAL PRESENTATION: 58 y/o F with severe COPD on home O2, 3rd  admission in last 2 months who presented 9/23 with AECOPD and bilateral pneumothoraces requiring mechanical ventilation.   STUDIES:   SIGNIFICANT EVENTS: 9/23  Bl chest tubes in ED 9/24  Failed SBT > tachycardic, diaphoretic 9/25  Failed SBT again 9/26  Self extubated and re tubed 9/28  Some weaning high PS 9/30  CT's to -20 cm sxn, no air leak, failed SBT in am  10/1 am hrs>>>mucous plug with pea 6 min arrest 10/7 back on MV for hypercapnia after ATC x 36h  10/8 L chest tube removed  SUBJECTIVE:   L CT out, R CT looks like it has been withdrawn, only the tip is in the pleural space.  Did not tolerate ATC this am, tachypneic on PSV 20 as well; ? Anxiety component  VITAL SIGNS: Temp:  [97.8 F (36.6 C)-98.8 F (37.1 C)] 98.6 F (37 C) (10/09 0800) Pulse Rate:  [93-122] 115 (10/09 0800) Resp:  [12-24] 18 (10/09 0800) BP: (66-136)/(45-85) 98/68 mmHg (10/09 0800) SpO2:  [91 %-100 %] 100 % (10/09 0858) FiO2 (%):  [40 %] 40 % (10/09 0920) Weight:  [78.8 kg (173 lb 11.6 oz)] 78.8 kg (173 lb 11.6 oz) (10/09 0430)  VENTILATOR SETTINGS: Vent Mode:  [-] PRVC FiO2 (%):  [40 %] 40 % Set Rate:  [12 bmp] 12 bmp Vt Set:  [550 mL] 550 mL PEEP:  [3 cmH20] 3 cmH20 Plateau Pressure:  [20 cmH20-29 cmH20] 27 cmH20  INTAKE / OUTPUT:  Intake/Output Summary (Last 24 hours) at 08/05/14 1137 Last data filed at 08/05/14 0800  Gross per 24 hour  Intake   1775 ml  Output    750 ml  Net   1025 ml    PHYSICAL EXAMINATION: Gen: anxious and ill appearing HEENT: trach site clean PULM: decreased bs, no wheeze, + air leak Rt, L CT is out CV: regular AB: soft, non tender Ext: chest, upper extremity creptius Neuro: RASS 0, normal  strength   LABS: PULMONARY  Recent Labs Lab 08/03/14 0049 08/03/14 0500  PHART 7.296* 7.465*  PCO2ART 89.5* 56.3*  PO2ART 79.0* 114.0*  HCO3 43.8* 40.0*  TCO2 46 41.7  O2SAT 93.0 98.6   CBC  Recent Labs Lab 08/02/14 0525 08/02/14 1800 08/04/14 0500  HGB 6.8* 7.0* 7.0*  HCT 22.9* 24.7* 23.5*  WBC 12.5* 15.1* 14.5*  PLT 289 301 291   COAGULATION No results found for this basename: INR,  in the last 168 hours  CARDIAC No results found for this basename: TROPONINI,  in the last 168 hours CHEMISTRY  Recent Labs Lab 08/01/14 0530 08/02/14 0525 08/03/14 0510 08/04/14 0500 08/05/14 0420  NA 148* 149* 143 140 140  K 3.9 3.4* 3.7 3.4* 4.3  CL 104 102 96 99 95*  CO2 41* 43* 41* 34* 40*  GLUCOSE 131* 129* 146* 105* 147*  BUN 24* 23 23 22  24*  CREATININE 0.39* 0.39* 0.36* 0.40* 0.42*  CALCIUM 8.3* 8.3* 8.4 7.4* 8.3*  MG  --   --  2.1 1.8 2.1  PHOS  --   --  2.8 2.8 3.6   Estimated Creatinine Clearance: 81.4 ml/min (by C-G formula based on Cr of 0.42).  LIVER  Recent Labs Lab 08/05/14 [phone]  AST 40*  ALT 89*  ALKPHOS 90  BILITOT 0.9  PROT 5.1*  ALBUMIN 2.4*   INFECTIOUS No results found for this basename: LATICACIDVEN, PROCALCITON,  in the last 168 hours ENDOCRINE CBG (last 3)   Recent Labs  08/05/14 0035 08/05/14 0417 08/05/14 0731  GLUCAP 104* 148* 146*    IMAGING x48h Dg Chest Port 1 View  08/04/2014   CLINICAL DATA:  Pneumothorax.  EXAM: PORTABLE CHEST - 1 VIEW  COMPARISON:  August 04, 2014.  FINDINGS: The heart size and mediastinal contours are within normal limits. Mild right apical and basilar pneumothorax is again noted and unchanged. Right-sided chest tube has been partially withdrawn. No significant pleural effusion is noted. Tracheostomy is in grossly good position. Right-sided PICC line is unchanged in position with distal tip overlying expected position of the SVC. Left-sided chest tube has been removed and no definite pneumothorax is  noted. The visualized skeletal structures are unremarkable.  IMPRESSION: Stable mild right apical and basilar pneumothorax. Right-sided chest tube has been partially withdrawn, although the distal tip remains within the thoracic space. No definite pneumothorax is noted on the left status post chest tube removal.   Electronically Signed   By: Roque LiasJames  Green M.D.   On: 08/04/2014 15:57   Dg Chest Port 1 View  08/04/2014   CLINICAL DATA:  Bilateral pneumothorax follow-up  EXAM: PORTABLE CHEST - 1 VIEW  COMPARISON:  08/03/2014  FINDINGS: Tracheostomy tube, NG tube, and bilateral chest tubes stable. Tiny left and small right pneumothorax unchanged. Hyperinflation consistent with COPD. Minimal infiltrate left base with mild hazy infiltrate right lower lobe possibly due to atelectasis related to the pneumothorax. Bilateral mild subcutaneous emphysema.  IMPRESSION: Bilateral pneumothorax, unchanged.   Electronically Signed   By: Esperanza Heiraymond  Rubner M.D.   On: 08/04/2014 07:23    ASSESSMENT / PLAN:  PULMONARY OETT 9/23 >> 9/26, 9/26 >> 10/02 Trach (DF) 10/02 >>  A:  Acute on chronic hypoxic/hypercapnic respiratory failure 2nd to AECOPD and b/l spontaneous PTX. Hx of GOLD 4 COPD with bullous emphysema. Mucus plug causing respiratory/cardiac arrest 10/1. P:   Trach collar as tolerated. Will have to go back to PSV weaning, will likely require at night (possible forever?). She will be an LTAC vent wean. Barrier to LTAC at this time is R chest tube and air leak Appreciated Dr VanTrigt's eval 10/5 > recommends conservative management; hopefully should benefit from the absence of positive pressure if she can stay off MV. Will slowly decrease suction on R CT. May need to revisit surgery for BPF next week F/u CXR Continue Brovana, Pulmicort, Q4 duoneb Steroids off on 10/7  NEUROLOGIC A:   Anxiety. P:   RASS goal: 0 Scheduled clonazepam and prn ativan  CARDIOVASCULAR A:  Hypotension, s/p arrest >>  resolved. A Fib + RVR, started amiodarone 10/7 P:  D/c'd amiodarone, suspect her A Fib was driven by resp distress and failure Restart metoprolol if BP will allow  RENAL A:  Hypervolemia >> improved. Hypernatremia P:   Monitor renal fx, urine outpt, electrolytes Goal even fluid balance Continue free water current dose  GASTROINTESTINAL A:   Nutrition. P:   Tube feeds Protonix for SUP Continue miralax  HEMATOLOGIC A:   Anemia of critical illness and chronic disease. P:  F/u CBC SQ heparin for DVT prevention  INFECTIOUS A:   AECOPD >> completed Abx. P:   Monitor clinically  ENDOCRINE A:   Mild Hyperglycemia - no hx DM. Adrenal insufficiency.  P:  SSI Steroids weaned to off  Family update: Updated husband 10/08  Interdisciplinary meeting: none    Levy Pupa, MD, PhD 08/05/2014, 11:37 AM [facility] [phone] or if no answer [phone]

## 2014-08-05 NOTE — Progress Notes (Addendum)
RT tried patient on 40% trach collar and patient began to guppy breath and sats fell to 86%. Patient implied it was hard to breath. Placed patient back on full support.  RT then attempted patient on CPAP 5/ PS 25 and patient began to guppy breath again and the vent only would pick up about 5-6 breaths a minute, even with sensitivity adjusted for patient. MVe was only 2.0. RN aware.  Patient was returned to full support and WOB returned to baseline prior to weaning. Patient is resting comfortably. Will continue to monitor and notify MD of weaning trials.

## 2014-08-06 ENCOUNTER — Inpatient Hospital Stay (HOSPITAL_COMMUNITY): Payer: BC Managed Care – PPO

## 2014-08-06 LAB — BASIC METABOLIC PANEL
ANION GAP: 5 (ref 5–15)
BUN: 17 mg/dL (ref 6–23)
CHLORIDE: 95 meq/L — AB (ref 96–112)
CO2: 39 mEq/L — ABNORMAL HIGH (ref 19–32)
CREATININE: 0.38 mg/dL — AB (ref 0.50–1.10)
Calcium: 8.4 mg/dL (ref 8.4–10.5)
GFR calc Af Amer: >90 mL/min (ref >90)
GFR calc non Af Amer: >90 mL/min (ref >90)
GLUCOSE: 114 mg/dL — AB (ref 70–99)
Potassium: 4.3 mEq/L (ref 3.7–5.3)
Sodium: 139 mEq/L (ref 137–147)

## 2014-08-06 LAB — GLUCOSE, CAPILLARY
GLUCOSE-CAPILLARY: 123 mg/dL — AB (ref 70–99)
GLUCOSE-CAPILLARY: 107 mg/dL — AB (ref 70–99)
GLUCOSE-CAPILLARY: 117 mg/dL — AB (ref 70–99)
Glucose-Capillary: 123 mg/dL — ABNORMAL HIGH (ref 70–99)
Glucose-Capillary: 109 mg/dL — ABNORMAL HIGH (ref 70–99)
Glucose-Capillary: 81 mg/dL (ref 70–99)

## 2014-08-06 MED ORDER — ETOMIDATE 2 MG/ML IV SOLN
INTRAVENOUS | Status: AC
  Administered 2014-08-06: 20 mg
  Filled 2014-08-06: qty 10

## 2014-08-06 MED ORDER — MIDAZOLAM HCL 2 MG/2ML IJ SOLN
INTRAMUSCULAR | Status: AC
  Administered 2014-08-06: 4 mg
  Filled 2014-08-06: qty 4

## 2014-08-06 MED ORDER — LIDOCAINE HCL (PF) 1 % IJ SOLN
INTRAMUSCULAR | Status: AC
  Administered 2014-08-06: 17:00:00
  Filled 2014-08-06: qty 10

## 2014-08-06 MED ORDER — IPRATROPIUM-ALBUTEROL 0.5-2.5 (3) MG/3ML IN SOLN
3.0000 mL | Freq: Four times a day (QID) | RESPIRATORY_TRACT | Status: DC
  Administered 2014-08-06 – 2014-08-11 (×20): 3 mL via RESPIRATORY_TRACT
  Filled 2014-08-06 (×22): qty 3

## 2014-08-06 NOTE — Progress Notes (Signed)
PULMONARY / CRITICAL CARE MEDICINE   Name: Elizabeth [name] MRN: [id number] DOB: 03/20/1956    ADMISSION DATE:  07/20/2014  REFERRING MD :  Ranae PalmsYelverton, ED  CHIEF COMPLAINT:  resp distress  INITIAL PRESENTATION: 58 y/o F with severe COPD on home O2, 3rd  admission in last 2 months who presented 9/23 with AECOPD and bilateral pneumothoraces requiring mechanical ventilation.   STUDIES:   SIGNIFICANT EVENTS: 9/23  Bl chest tubes in ED 9/24  Failed SBT > tachycardic, diaphoretic 9/25  Failed SBT again 9/26  Self extubated and re tubed 9/28  Some weaning high PS 9/30  CT's to -20 cm sxn, no air leak, failed SBT in am  10/1 am hrs>>>mucous plug with pea 6 min arrest 10/7 back on MV for hypercapnia after ATC x 36h  10/8 L chest tube removed  SUBJECTIVE:   Anxiety remains  issue  Not able to wean on vent -does not tolerate PS, or trach collar weaning  Right chest tube remains on sxn , no air leak  Remains off pressors   VITAL SIGNS: Temp:  [98 F (36.7 C)-99 F (37.2 C)] 98.7 F (37.1 C) (10/10 0747) Pulse Rate:  [113-127] 116 (10/10 0800) Resp:  [12-32] 13 (10/10 0800) BP: (68-141)/(16-81) 118/66 mmHg (10/10 0807) SpO2:  [93 %-100 %] 99 % (10/10 0800) FiO2 (%):  [40 %] 40 % (10/10 0748) Weight:  [175 lb 14.8 oz (79.8 kg)] 175 lb 14.8 oz (79.8 kg) (10/10 0500)  VENTILATOR SETTINGS: Vent Mode:  [-] PRVC FiO2 (%):  [40 %] 40 % Set Rate:  [12 bmp] 12 bmp Vt Set:  [550 mL] 550 mL PEEP:  [3 cmH20] 3 cmH20 Plateau Pressure:  [25 cmH20-27 cmH20] 26 cmH20  INTAKE / OUTPUT:  Intake/Output Summary (Last 24 hours) at 08/06/14 1016 Last data filed at 08/06/14 0800  Gross per 24 hour  Intake    955 ml  Output    865 ml  Net     90 ml    PHYSICAL EXAMINATION: Gen: anxious and ill appearing HEENT: trach site clean PULM: decreased bs, no wheeze, no  air leak Rt, L CT is out CV: regular AB: soft, non tender Ext: chest, upper extremity creptius Neuro: RASS 0, normal  strength   LABS: PULMONARY  Recent Labs Lab 08/03/14 0049 08/03/14 0500  PHART 7.296* 7.465*  PCO2ART 89.5* 56.3*  PO2ART 79.0* 114.0*  HCO3 43.8* 40.0*  TCO2 46 41.7  O2SAT 93.0 98.6   CBC  Recent Labs Lab 08/02/14 0525 08/02/14 1800 08/04/14 0500  HGB 6.8* 7.0* 7.0*  HCT 22.9* 24.7* 23.5*  WBC 12.5* 15.1* 14.5*  PLT 289 301 291   COAGULATION No results found for this basename: INR,  in the last 168 hours  CARDIAC No results found for this basename: TROPONINI,  in the last 168 hours CHEMISTRY  Recent Labs Lab 08/02/14 0525 08/03/14 0510 08/04/14 0500 08/05/14 0420 08/06/14 0441  NA 149* 143 140 140 139  K 3.4* 3.7 3.4* 4.3 4.3  CL 102 96 99 95* 95*  CO2 43* 41* 34* 40* 39*  GLUCOSE 129* 146* 105* 147* 114*  BUN 23 23 22  24* 17  CREATININE 0.39* 0.36* 0.40* 0.42* 0.38*  CALCIUM 8.3* 8.4 7.4* 8.3* 8.4  MG  --  2.1 1.8 2.1  --   PHOS  --  2.8 2.8 3.6  --    Estimated Creatinine Clearance: 81.9 ml/min (by C-G formula based on Cr of 0.38).  LIVER  Recent  Labs Lab 08/05/14 0420  AST 40*  ALT 89*  ALKPHOS 90  BILITOT 0.9  PROT 5.1*  ALBUMIN 2.4*   INFECTIOUS No results found for this basename: LATICACIDVEN, PROCALCITON,  in the last 168 hours ENDOCRINE CBG (last 3)   Recent Labs  08/06/14 0017 08/06/14 0411 08/06/14 0733  GLUCAP 123* 123* 109*    IMAGING x48h No results found.  ASSESSMENT / PLAN:  PULMONARY OETT 9/23 >> 9/26, 9/26 >> 10/02 Trach (DF) 10/02 >>  A:  Acute on chronic hypoxic/hypercapnic respiratory failure 2nd to AECOPD and b/l spontaneous PTX. Hx of GOLD 4 COPD with bullous emphysema.-off steroids 10/7  Mucus plug causing respiratory/cardiac arrest 10/1. P:   Goal to wean to trach collar , w/ likely need for nighttime vent support Attempt to wean and trach collar trial today, ps 12-20 Appreciated Dr VanTrigt's eval 10/5 > recommends conservative management; hopefully should benefit from the absence of positive  pressure if she can stay off MV. Would like to slowly decrease suction on R CT. May need to revisit surgery for BPF next week F/u CXR today - PTX, chest tube out,. Will need new CT placed, will d/w cvts Continue Brovana, Pulmicort,  Decrease duoneb Q6 (?albuterol contributing to nervousness)   NEUROLOGIC A:   Anxiety. P:   RASS goal: 0 Scheduled clonazepam and prn ativan  CARDIOVASCULAR A:  Hypotension, s/p arrest >> resolved. A Fib + RVR, started amiodarone 10/7>d/c amiodarone (likely d/t resp distress/failure)  10/10>b/p remain low nml, hold BB restart until improved, remains  P:  Restart metoprolol as BP will allow  RENAL A:  Hypervolemia >> improved. Hypernatremia>>improved  P:   Monitor renal fx, urine outpt, electrolytes Goal even fluid balance Continue free water current dose  GASTROINTESTINAL A:   Nutrition. P:   Tube feeds Protonix for SUP Continue miralax likely needs peg this week  HEMATOLOGIC A:   Anemia of critical illness and chronic disease. P:  F/u CBC in am  SQ heparin for DVT prevention  INFECTIOUS A:   AECOPD >> completed Abx. P:   Monitor clinically  ENDOCRINE A:   Mild Hyperglycemia - no hx DM. Adrenal insufficiency.  P:   SSI Steroids weaned to off  Family update: Updated husband 10/08  Interdisciplinary meeting: none   Tammy Parrett NP-C  [facility]  [phone]   needs new CT, failure to wean   Mcarthur Rossettianiel J. Tyson AliasFeinstein, MD, FACP Pgr: [phone] [facility]

## 2014-08-06 NOTE — Procedures (Signed)
Chest Tube Insertion Procedure Note  Indications:  Clinically significant Pneumothorax right, old CT out, 20% PTX  Pre-operative Diagnosis: Pneumothorax  Post-operative Diagnosis: Pneumothorax  Procedure Details  Informed consent was obtained for the procedure, including sedation.  Risks of lung perforation, hemorrhage, arrhythmia, and adverse drug reaction were discussed.   After sterile skin prep, using standard technique, a 20 French tube was placed in the right lateral 6th rib space.  Findings: 20 ml of serous fluid obtained and big pressur gush air  Estimated Blood Loss:  Minimal         Specimens:  None              Complications:  None; patient tolerated the procedure well.         Disposition: ICU - intubated and critically ill.         Condition: stable  Attending Attestation: I performed the   To 30 cm suction  Mcarthur Rossettianiel J. Tyson AliasFeinstein, MD, FACP Pgr: [phone] [facility] procedure.

## 2014-08-06 NOTE — Progress Notes (Addendum)
Requested to obtain consent for chest tube placement by MD.  All numbers for spouse attempted without success.  MD notified.  Layson Bertsch, [facility]   I spoke with pt's husband via telephone during insertion of chest tube.  Husband verbalized understanding of procedure and reason for tube placement. Mckenzee Beem, [facility]

## 2014-08-06 NOTE — Progress Notes (Signed)
Attempted pt on ATC, immediate accessory muscle use, desats 80%, hr 130. Placed back on full support at this time.

## 2014-08-07 ENCOUNTER — Inpatient Hospital Stay (HOSPITAL_COMMUNITY): Payer: BC Managed Care – PPO

## 2014-08-07 LAB — CBC
HCT: 21.5 % — ABNORMAL LOW (ref 36.0–46.0)
Hemoglobin: 6.6 g/dL — CL (ref 12.0–15.0)
MCH: 30 pg (ref 26.0–34.0)
MCHC: 30.7 g/dL (ref 30.0–36.0)
MCV: 97.7 fL (ref 78.0–100.0)
PLATELETS: 272 10*3/uL (ref 150–400)
RBC: 2.2 MIL/uL — ABNORMAL LOW (ref 3.87–5.11)
RDW: 16.5 % — ABNORMAL HIGH (ref 11.5–15.5)
WBC: 9.7 10*3/uL (ref 4.0–10.5)

## 2014-08-07 LAB — BASIC METABOLIC PANEL
Anion gap: 8 (ref 5–15)
BUN: 12 mg/dL (ref 6–23)
CALCIUM: 8.4 mg/dL (ref 8.4–10.5)
CO2: 36 mEq/L — ABNORMAL HIGH (ref 19–32)
Chloride: 92 mEq/L — ABNORMAL LOW (ref 96–112)
Creatinine, Ser: 0.46 mg/dL — ABNORMAL LOW (ref 0.50–1.10)
GFR calc Af Amer: >90 mL/min (ref >90)
Glucose, Bld: 111 mg/dL — ABNORMAL HIGH (ref 70–99)
POTASSIUM: 4.2 meq/L (ref 3.7–5.3)
SODIUM: 136 meq/L — AB (ref 137–147)

## 2014-08-07 LAB — GLUCOSE, CAPILLARY
GLUCOSE-CAPILLARY: 117 mg/dL — AB (ref 70–99)
Glucose-Capillary: 104 mg/dL — ABNORMAL HIGH (ref 70–99)
Glucose-Capillary: 113 mg/dL — ABNORMAL HIGH (ref 70–99)
Glucose-Capillary: 120 mg/dL — ABNORMAL HIGH (ref 70–99)
Glucose-Capillary: 136 mg/dL — ABNORMAL HIGH (ref 70–99)
Glucose-Capillary: 97 mg/dL (ref 70–99)

## 2014-08-07 LAB — ABO/RH: ABO/RH(D): A POS

## 2014-08-07 LAB — PREPARE RBC (CROSSMATCH)

## 2014-08-07 MED ORDER — FREE WATER
150.0000 mL | Freq: Two times a day (BID) | Status: DC
  Administered 2014-08-07 – 2014-08-12 (×10): 150 mL

## 2014-08-07 MED ORDER — FENTANYL 50 MCG/HR TD PT72
50.0000 ug | MEDICATED_PATCH | TRANSDERMAL | Status: DC
  Administered 2014-08-07 – 2014-08-16 (×4): 50 ug via TRANSDERMAL
  Filled 2014-08-07 (×4): qty 1

## 2014-08-07 MED ORDER — FUROSEMIDE 10 MG/ML IJ SOLN
20.0000 mg | Freq: Two times a day (BID) | INTRAMUSCULAR | Status: DC
  Administered 2014-08-07 – 2014-08-08 (×3): 20 mg via INTRAVENOUS
  Filled 2014-08-07 (×3): qty 2

## 2014-08-07 MED ORDER — SODIUM CHLORIDE 0.9 % IV SOLN: Freq: Once | INTRAVENOUS | Status: DC

## 2014-08-07 NOTE — Progress Notes (Signed)
CRITICAL VALUE ALERT  Critical value received:  Hmg  6.6  Date of notification:  08/07/2014  Time of notification:  0550  Critical value read back:Yes.    Nurse who received alert:  G. Myer HaffEpling, RN  MD notified (1st page):  E-link Kendrick Fries(McQuaid)  Time of first page:  [phone]  MD notified (2nd page):  Time of second page:  Responding MD:  Dr. Kendrick FriesMcQuaid Time MD responded:  [phone]

## 2014-08-07 NOTE — Progress Notes (Signed)
Pt received from 50M per bed. Respiratory therapist and 4 RN's plus nurse techs transferred pt to new bed, CHG bath done, Chest tube on Rt placed to 30 cm suction, TF reconnected, trach to vent.  Pt alert/oriented

## 2014-08-07 NOTE — Progress Notes (Signed)
Pt weighed in bed. Bed was zeroed prior to receiving pt. Noted discrepancy in weight by 3.3 KG ICU weight 80.1 kg  -2C weight 76.8 kg

## 2014-08-07 NOTE — Progress Notes (Signed)
eLink Physician-Brief Progress Note Patient Name: Elizabeth [name] DOB: [birth year] MRN: [id number]   Date of Service  08/07/2014  HPI/Events of Note  hgb 6.6, no obvious bleeding source  eICU Interventions  Transfuse 1 U PRBC     Intervention Category Intermediate Interventions: Bleeding - evaluation and treatment with blood products  [name] 08/07/2014, 5:53 AM

## 2014-08-07 NOTE — Progress Notes (Signed)
PULMONARY / CRITICAL CARE MEDICINE   Name: Elizabeth [name] MRN: [id number] DOB: April 13, 1956    ADMISSION DATE:  07/20/2014  REFERRING MD :  Ranae Palms, ED  CHIEF COMPLAINT:  resp distress  INITIAL PRESENTATION: 58 y/o F with severe COPD on home O2, 3rd  admission in last 2 months who presented 9/23 with AECOPD and bilateral pneumothoraces requiring mechanical ventilation.   STUDIES: new chest tube in  SIGNIFICANT EVENTS: 9/23  Bl chest tubes in ED 9/24  Failed SBT > tachycardic, diaphoretic 9/25  Failed SBT again 9/26  Self extubated and re tubed 9/28  Some weaning high PS 9/30  CT's to -20 cm sxn, no air leak, failed SBT in am  10/1 am hrs>>>mucous plug with pea 6 min arrest 10/7 back on MV for hypercapnia after ATC x 36h  10/8 L chest tube removed 10/10 R CT replaced   SUBJECTIVE:   Anxiety remains  issue  Not able to wean on vent -does not tolerate PS, or trach collar weaning  Right chest tube replaced 10/10  Hgb tr down , tranx 1 u PRBC , no active source identified    VITAL SIGNS: Temp:  [98.3 F (36.8 C)-99.1 F (37.3 C)] 99.1 F (37.3 C) (10/11 0945) Pulse Rate:  [107-128] 111 (10/11 0945) Resp:  [12-36] 19 (10/11 0945) BP: (67-137)/(39-79) 98/52 mmHg (10/11 0945) SpO2:  [95 %-100 %] 100 % (10/11 0945) FiO2 (%):  [40 %] 40 % (10/11 0945) Weight:  [176 lb 9.4 oz (80.1 kg)] 176 lb 9.4 oz (80.1 kg) (10/11 0800)  VENTILATOR SETTINGS: Vent Mode:  [-] PRVC FiO2 (%):  [40 %] 40 % Set Rate:  [12 bmp] 12 bmp Vt Set:  [550 mL] 550 mL PEEP:  [3 cmH20] 3 cmH20 Plateau Pressure:  [20 cmH20-25 cmH20] 25 cmH20  INTAKE / OUTPUT:  Intake/Output Summary (Last 24 hours) at 08/07/14 1001 Last data filed at 08/07/14 0945  Gross per 24 hour  Intake   1315 ml  Output    750 ml  Net    565 ml    PHYSICAL EXAMINATION: Gen: anxious and ill appearing HEENT: trach site clean PULM: decreased bs, no wheeze, RCT , dsg dry/clean , no airleak  CV: regular AB: soft, non  tender Ext: intact  Neuro: RASS 0, normal strength   LABS: PULMONARY  Recent Labs Lab 08/03/14 0049 08/03/14 0500  PHART 7.296* 7.465*  PCO2ART 89.5* 56.3*  PO2ART 79.0* 114.0*  HCO3 43.8* 40.0*  TCO2 46 41.7  O2SAT 93.0 98.6   CBC  Recent Labs Lab 08/02/14 1800 08/04/14 0500 08/07/14 0500  HGB 7.0* 7.0* 6.6*  HCT 24.7* 23.5* 21.5*  WBC 15.1* 14.5* 9.7  PLT 301 291 272   COAGULATION No results found for this basename: INR,  in the last 168 hours  CARDIAC No results found for this basename: TROPONINI,  in the last 168 hours CHEMISTRY  Recent Labs Lab 08/03/14 0510 08/04/14 0500 08/05/14 0420 08/06/14 0441 08/07/14 0500  NA 143 140 140 139 136*  K 3.7 3.4* 4.3 4.3 4.2  CL 96 99 95* 95* 92*  CO2 41* 34* 40* 39* 36*  GLUCOSE 146* 105* 147* 114* 111*  BUN 23 22 24* 17 12  CREATININE 0.36* 0.40* 0.42* 0.38* 0.46*  CALCIUM 8.4 7.4* 8.3* 8.4 8.4  MG 2.1 1.8 2.1  --   --   PHOS 2.8 2.8 3.6  --   --    Estimated Creatinine Clearance: 82.1 ml/min (by C-G formula  based on Cr of 0.46).  LIVER  Recent Labs Lab 08/05/14 0420  AST 40*  ALT 89*  ALKPHOS 90  BILITOT 0.9  PROT 5.1*  ALBUMIN 2.4*   INFECTIOUS No results found for this basename: LATICACIDVEN, PROCALCITON,  in the last 168 hours ENDOCRINE CBG (last 3)   Recent Labs  08/07/14 0034 08/07/14 0445 08/07/14 0727  GLUCAP 97 120* 117*    IMAGING x48h Dg Chest Port 1 View  08/06/2014   CLINICAL DATA:  Chest tube placement.  EXAM: PORTABLE CHEST - 1 VIEW  COMPARISON:  August 06, 2014 11:30 a.m.  FINDINGS: The heart size and mediastinal contours are stable. There is interval placement of a right chest tube with distal tip near the right hilum. The previously noted right pneumothorax is significantly decreased with question pleural line in the right apex. There is right subcutaneous emphysema. The left lung is clear. Tracheostomy tube, enteric feeding tube, right central venous line are  unchanged.  IMPRESSION: Interval placement of a right chest tube with distal tip near the right hilum. The previously noted right pneumothorax is significantly decreased with question pleural line in the right apex.   Electronically Signed   By: Sherian ReinWei-Chen  Lin M.D.   On: 08/06/2014 17:58   Dg Chest Port 1 View  08/06/2014   CLINICAL DATA:  Subsequent encounter for COPD and right-sided pneumothorax.  EXAM: PORTABLE CHEST - 1 VIEW  COMPARISON:  One-view chest 08/04/2014  FINDINGS: The right-sided chest tube is now outside the pleural space completely. A small pneumothorax of tender 20% is slightly improved from the prior study. The tracheostomy tube and feeding tube are stable. Bibasilar airspace disease is again noted. Changes of COPD are noted.  IMPRESSION: 1. Decrease in size of small pneumothorax accounting for 10-20% of the right hemi thorax volume. 2. The right-sided chest tube has been pulled back and is now completely outside the pleural space. 3. Chronic changes of COPD. These results will be called to the ordering clinician or representative by the Radiologist Assistant, and communication documented in the PACS or zVision Dashboard.   Electronically Signed   By: Gennette Pachris  Mattern M.D.   On: 08/06/2014 11:49    ASSESSMENT / PLAN:  PULMONARY OETT 9/23 >> 9/26, 9/26 >> 10/02 Trach (DF) 10/02 >> Chest tube rt 10/10>>>  A:  Acute on chronic hypoxic/hypercapnic respiratory failure 2nd to AECOPD and b/l spontaneous PTX. Hx of GOLD 4 COPD with bullous emphysema.-off steroids 10/7  Mucus plug causing respiratory/cardiac arrest 10/1. P:   Attempt to wean  ps 12-20 if able = failure a concern, may need comfort care  cvtas following, with new CT, no leak noted F/u CXR today  Reveals CT wnl, no leak clinically Continue Brovana, Pulmicort, Duoneb  Was hoping with removal new PTX right, weaning would progress Keep CT to suction for 24 hr further  NEUROLOGIC A:   Anxiety. P:   RASS goal:  0 Scheduled clonazepam and prn ativan WUA  CARDIOVASCULAR A:  Hypotension, s/p arrest >> resolved. A Fib + RVR, started amiodarone 10/7>d/c amiodarone (likely d/t resp distress/failure)  10/10>b/p remain low nml, hold BB restart until improved, remains  P:  Restart metoprolol as BP will allow, dc if wheezing or continue failure to wean Borderline BP - no start BB  RENAL A:  Hypervolemia >> improved Hyponatremia  P:   Monitor renal fx, urine outpt, electrolytes Goal even fluid balance Decrease Free water 150q12  - dc in am   GASTROINTESTINAL A:  Nutrition. P:   Tube feeds Protonix for SUP Continue miralax likely needs peg this if continued support  HEMATOLOGIC A:   Anemia of critical illness and chronic disease. 10/11>>hbg drifting down w/ no source of active bleeding noted  P:  1 U PRBC x 1  F/u CBC in am  SQ heparin for DVT prevention-may need to d/c if not improving   INFECTIOUS A:   AECOPD >> completed Abx. P:   Monitor clinically  ENDOCRINE A:   Mild Hyperglycemia - no hx DM. Adrenal insufficiency.  P:   SSI   Family update: family updated at bedside  Will need further talks for long term To sdu vent  Interdisciplinary meeting: none   Tammy Parrett NP-C  [facility]  [phone]    I have fully examined this patient and agree with above findings.    And edited i nfull   Mcarthur Rossettianiel J. Tyson AliasFeinstein, MD, FACP Pgr: [phone] [facility]

## 2014-08-07 NOTE — Progress Notes (Signed)
Wasted 1500 mcg of Fentanyl in the sink. [name] witnessed waste. [name], RN

## 2014-08-08 ENCOUNTER — Inpatient Hospital Stay (HOSPITAL_COMMUNITY): Payer: BC Managed Care – PPO

## 2014-08-08 LAB — GLUCOSE, CAPILLARY
GLUCOSE-CAPILLARY: 119 mg/dL — AB (ref 70–99)
GLUCOSE-CAPILLARY: 134 mg/dL — AB (ref 70–99)
GLUCOSE-CAPILLARY: 121 mg/dL — AB (ref 70–99)
Glucose-Capillary: 107 mg/dL — ABNORMAL HIGH (ref 70–99)
Glucose-Capillary: 122 mg/dL — ABNORMAL HIGH (ref 70–99)

## 2014-08-08 LAB — CBC WITH DIFFERENTIAL/PLATELET
BASOS ABS: 0 10*3/uL (ref 0.0–0.1)
Basophils Relative: 0 % (ref 0–1)
EOS PCT: 2 % (ref 0–5)
Eosinophils Absolute: 0.2 10*3/uL (ref 0.0–0.7)
HEMATOCRIT: 25.1 % — AB (ref 36.0–46.0)
HEMOGLOBIN: 7.8 g/dL — AB (ref 12.0–15.0)
LYMPHS ABS: 1.8 10*3/uL (ref 0.7–4.0)
LYMPHS PCT: 19 % (ref 12–46)
MCH: 29.9 pg (ref 26.0–34.0)
MCHC: 31.1 g/dL (ref 30.0–36.0)
MCV: 96.2 fL (ref 78.0–100.0)
MONO ABS: 0.8 10*3/uL (ref 0.1–1.0)
MONOS PCT: 8 % (ref 3–12)
NEUTROS ABS: 6.9 10*3/uL (ref 1.7–7.7)
Neutrophils Relative %: 71 % (ref 43–77)
Platelets: 284 10*3/uL (ref 150–400)
RBC: 2.61 MIL/uL — ABNORMAL LOW (ref 3.87–5.11)
RDW: 15.9 % — AB (ref 11.5–15.5)
WBC: 9.6 10*3/uL (ref 4.0–10.5)

## 2014-08-08 LAB — BASIC METABOLIC PANEL
Anion gap: 6 (ref 5–15)
BUN: 9 mg/dL (ref 6–23)
CHLORIDE: 91 meq/L — AB (ref 96–112)
CO2: 39 meq/L — AB (ref 19–32)
CREATININE: 0.47 mg/dL — AB (ref 0.50–1.10)
Calcium: 8.2 mg/dL — ABNORMAL LOW (ref 8.4–10.5)
GFR calc Af Amer: >90 mL/min (ref >90)
GFR calc non Af Amer: >90 mL/min (ref >90)
Glucose, Bld: 111 mg/dL — ABNORMAL HIGH (ref 70–99)
Potassium: 3.9 mEq/L (ref 3.7–5.3)
SODIUM: 136 meq/L — AB (ref 137–147)

## 2014-08-08 LAB — TYPE AND SCREEN
ABO/RH(D): A POS
ANTIBODY SCREEN: NEGATIVE
UNIT DIVISION: 0
Unit division: 0

## 2014-08-08 LAB — PHOSPHORUS: Phosphorus: 4.1 mg/dL (ref 2.3–4.6)

## 2014-08-08 LAB — MAGNESIUM: Magnesium: 1.9 mg/dL (ref 1.5–2.5)

## 2014-08-08 MED ORDER — FAMOTIDINE 40 MG/5ML PO SUSR
20.0000 mg | Freq: Every day | ORAL | Status: DC
  Administered 2014-08-08 – 2014-08-17 (×10): 20 mg
  Filled 2014-08-08 (×11): qty 2.5

## 2014-08-08 MED ORDER — ENOXAPARIN SODIUM 40 MG/0.4ML ~~LOC~~ SOLN
40.0000 mg | Freq: Every day | SUBCUTANEOUS | Status: DC
  Administered 2014-08-08 – 2014-08-17 (×10): 40 mg via SUBCUTANEOUS
  Filled 2014-08-08 (×10): qty 0.4

## 2014-08-08 MED ORDER — INSULIN ASPART 100 UNIT/ML ~~LOC~~ SOLN: 0.0000 [IU] | SUBCUTANEOUS | Status: DC

## 2014-08-08 NOTE — Progress Notes (Signed)
PULMONARY / CRITICAL CARE MEDICINE   Name: Elizabeth [name] Clinch MRN: [id number] DOB: [birth year]    ADMISSION DATE:  07/20/2014  REFERRING MD :  Ranae PalmsYelverton, ED  CHIEF COMPLAINT:  resp distress  INITIAL PRESENTATION: 58 y/o F with severe COPD on home O2, 3rd  admission in last 2 months who presented 9/23 with AECOPD and bilateral pneumothoraces requiring mechanical ventilation.   STUDIES:   SIGNIFICANT EVENTS: 9/23  Bl chest tubes in ED 9/24  Failed SBT > tachycardic, diaphoretic 9/25  Failed SBT again 9/26  Self extubated and re tubed 9/28  Some weaning high PS 9/30  CT's to -20 cm sxn, no air leak, failed SBT in am  10/01 am hrs>>>mucous plug with pea 6 min arrest 100/7 back on MV for hypercapnia after ATC x 36h  10/08 L chest tube removed 10/11 R chest tube malpositioned. Recurrent R PTX. New R chest tube placed 10/11 received one unit PRBCs for Hgb 6.6 10/12 No ptx on CXR. No air leak on CXR. No progress weaning  SUBJECTIVE:   Calm. RASS -1. + F/C  VITAL SIGNS: Temp:  [98.5 F (36.9 C)-100.1 F (37.8 C)] 100.1 F (37.8 C) (10/12 0759) Pulse Rate:  [117-128] 120 (10/12 0759) Resp:  [12-29] 16 (10/12 0759) BP: (71-129)/(50-80) 83/52 mmHg (10/12 0801) SpO2:  [96 %-100 %] 96 % (10/12 0759) FiO2 (%):  [40 %] 40 % (10/12 1051) Weight:  [76.1 kg (167 lb 12.3 oz)-76.8 kg (169 lb 5 oz)] 76.1 kg (167 lb 12.3 oz) (10/12 0357)  VENTILATOR SETTINGS: Vent Mode:  [-] PRVC FiO2 (%):  [40 %] 40 % Set Rate:  [12 bmp] 12 bmp Vt Set:  [550 mL] 550 mL PEEP:  [3 cmH20] 3 cmH20 Plateau Pressure:  [18 cmH20-22 cmH20] 19 cmH20  INTAKE / OUTPUT:  Intake/Output Summary (Last 24 hours) at 08/08/14 1123 Last data filed at 08/08/14 1000  Gross per 24 hour  Intake   1270 ml  Output   3345 ml  Net  -2075 ml    PHYSICAL EXAMINATION: Gen: NAD HEENT: trach site clean PULM: distant BS, no adventitious sounds, no air leak CV: Reg, no M  AB: Soft, +BS Ext: warm, symmetric edema Neuro:  diffusely weak, no focal defcits   LABS: I have reviewed all of today's lab results. Relevant abnormalities are discussed in the A/P section  CXR: no pneumothorax  ASSESSMENT / PLAN:  PULMONARY ETT 9/23 >> 9/26, 9/26 >> 10/02 Trach (DF) 10/02 >>   A:  Acute on chronic hypoxic/hypercapnic respiratory failure AECOPD  Bilateral spontaneous pneumothoraces Hx of GOLD 4 COPD with bullous emphysema Mucus plug causing arrest 10/01. P:   Cont vent support - settings reviewed and/or adjusted Wean in PS as tolerated Cont vent bundle She will need LTACH No reason for duplicate beta agonist therapy - DC arformoterol Chest tube to water seal 10/12  NEUROLOGIC A:   Anxiety, controlled Pain. controlled P:   Cont Duragesic and scheduled clonazepam  CARDIOVASCULAR A:  Hypotension, resolved. PAF with RVR - resolved  P:  Holding metoprolol Avoid excessive beta agonist therapy  RENAL A:  Hypervolemia, improving. Hypernatremia, resolved Edema due to low oncotic pressure P:   Monitor BMET intermittently Monitor I/Os Correct electrolytes as indicated  GASTROINTESTINAL A:   No issues P:   SUP: change PPI to famotidine 10/12 Cont TFs  HEMATOLOGIC A:   ICU acquired anemia without overt bleeding P:  DVT px: LMWH  Monitor CBC intermittently Transfuse per usual ICU guidelines  INFECTIOUS A:   Purulent bronchitis - completed course of abx P:   monitor  ENDOCRINE A:   Steroid associated hyperglycemia, resolved P:   DC SSI Monitor glu on chem panels Resume SSI for glu > 180   Billy Fischeravid Whittaker Lenis, MD ; [facility] service Mobile [phone].  After 5:30 PM or weekends, call [phone]

## 2014-08-09 ENCOUNTER — Inpatient Hospital Stay (HOSPITAL_COMMUNITY): Payer: BC Managed Care – PPO

## 2014-08-09 LAB — GLUCOSE, CAPILLARY
Glucose-Capillary: 117 mg/dL — ABNORMAL HIGH (ref 70–99)
Glucose-Capillary: 136 mg/dL — ABNORMAL HIGH (ref 70–99)

## 2014-08-09 MED ORDER — METOPROLOL TARTRATE 12.5 MG HALF TABLET
12.5000 mg | ORAL_TABLET | Freq: Two times a day (BID) | ORAL | Status: DC
  Administered 2014-08-09 (×2): 12.5 mg via ORAL
  Filled 2014-08-09 (×3): qty 1

## 2014-08-09 NOTE — Evaluation (Signed)
Physical Therapy Evaluation Patient Details Name: Elizabeth [name] MRN: [id number] DOB: [birth year] Today's Date: 08/09/2014   History of Present Illness  pt presents with Respiratory Failure requiring ventilation, Bil Pneumothoraces, and COPD exacerbation.  pt with 4 recent admits for respiratory difficulties.    Clinical Impression  Pt generally anxious throughout session but nods head when asked about attempting chair positioning in bed.  Pt's HR remained in 130's at rest and peaked at 139 while positioning bed in chair position, however returned to low 130's once in chair position.  BP stable during session and RN aware of elevated HR.  Feels pt wound benefit from OT consult and CIR consult.  Will continue to follow.      Follow Up Recommendations CIR    Equipment Recommendations  None recommended by PT    Recommendations for Other Services       Precautions / Restrictions Precautions Precautions: Fall Precaution Comments: Vent, NG tube, Increase HR Restrictions Weight Bearing Restrictions: No      Mobility  Bed Mobility Overal bed mobility: Needs Assistance             General bed mobility comments: pt placed in chair position in bed with HOB elevated up to 61*.  pt able to sit ~15 mins with good tolerance, BP stable, HR remained130's at rest and in chair position.  pt indicates wanting to remain more upright after session and pt left in chair position, but with HOB reduced to 45* for safety.    Transfers                    Ambulation/Gait                Stairs            Wheelchair Mobility    Modified Rankin (Stroke Patients Only)       Balance                                             Pertinent Vitals/Pain Pain Assessment: Faces Faces Pain Scale: Hurts little more Pain Location: pt rubs abdomen when asking about pain location.   Pain Descriptors / Indicators: Pressure Pain Intervention(s): Monitored during  session;Repositioned    Home Living Family/patient expects to be discharged to:: Inpatient rehab Living Arrangements: Spouse/significant other Available Help at Discharge: Available PRN/intermittently Type of Home: House Home Access: Stairs to enter   Entergy CorporationEntrance Stairs-Number of Steps: 2 Home Layout: One level Home Equipment: Walker - 2 wheels;Bedside commode      Prior Function Level of Independence: Needs assistance   Gait / Transfers Assistance Needed: Pt amb household distances with walker  ADL's / Homemaking Assistance Needed: Assist needed due to limited activity tolerance.        Hand Dominance        Extremity/Trunk Assessment   Upper Extremity Assessment: Defer to OT evaluation           Lower Extremity Assessment: Generalized weakness         Communication   Communication: Tracheostomy (pt mouths words.  )  Cognition Arousal/Alertness: Awake/alert Behavior During Therapy: WFL for tasks assessed/performed Overall Cognitive Status: Difficult to assess                      General Comments      Exercises  Assessment/Plan    PT Assessment Patient needs continued PT services  PT Diagnosis Difficulty walking;Generalized weakness   PT Problem List Decreased strength;Decreased activity tolerance;Decreased balance;Decreased mobility;Decreased knowledge of use of DME;Cardiopulmonary status limiting activity;Pain  PT Treatment Interventions DME instruction;Gait training;Stair training;Functional mobility training;Therapeutic activities;Therapeutic exercise;Balance training;Patient/family education   PT Goals (Current goals can be found in the Care Plan section) Acute Rehab PT Goals Patient Stated Goal: None stated. PT Goal Formulation: With patient/family Time For Goal Achievement: 08/23/14 Potential to Achieve Goals: Good    Frequency Min 3X/week   Barriers to discharge        Co-evaluation               End of Session  Equipment Utilized During Treatment:  (Vent) Activity Tolerance: Treatment limited secondary to medical complications (Comment) (Elevated HR) Patient left: in bed;with call bell/phone within reach;with nursing/sitter in room;with family/visitor present Nurse Communication: Mobility status (HR)         Time: [id number] PT Time Calculation (min): 35 min   Charges:   PT Evaluation $Initial PT Evaluation Tier I: 1 Procedure PT Treatments $Therapeutic Activity: 23-37 mins   PT G CodesSunny Schlein:          Nealie Mchatton F, [place] CarolinaPT [phone] 08/09/2014, 12:26 PM

## 2014-08-09 NOTE — Progress Notes (Signed)
NUTRITION FOLLOW UP  Intervention:   Continue current TF regimen RD to follow for nutrition care plan  Nutrition Dx:   Inadequate oral intake related to inability to eat as evidenced by NPO status, ongoing  Goal:   Pt to meet >/= 90% of their estimated nutrition needs, met  Monitor:   TF regimen & tolerance, respiratory status, weight, labs, I/O's  Assessment:   3rd admission in last 2 months for 58 year old with severe COPD on home oxygen but acute respiratory failure due to COPD exacerbation complicated by bilateral pneumothoraces and mechanical ventilation.  Patient transferred from 3M-MICU to 2C-Stepdown 10/11.  Patient is currently on ventilator support via trach MV: 6.8 L/min Temp (24hrs), Avg:98.9 F (37.2 C), Min:98.2 F (36.8 C), Max:99.6 F (37.6 C)   Pt s/p chest tube insertion 10/10 for pneumothorax.  Vital AF 1.2 formula currently infusing at 55 ml/hr via NGT providing 1584 kcals, 99 gm protein, 1071 ml of free water.  Noted pt will possibly need PEG tube placement.  Height: Ht Readings from Last 1 Encounters:  08/07/14 5' 5"  (1.651 m)    Weight Status:   Wt Readings from Last 1 Encounters:  08/09/14 167 lb 8.8 oz (76 kg)  07/26/14  156 lb 15.5 oz (71.2 kg)  07/20/14  134 lb 0.6 oz (60.8 kg)    Body mass index is 27.88 kg/(m^2).  Re-estimated needs:  Kcal: 1600-1750 Protein: 85-100 gm Fluid: 1.6-1.8 L  Skin: Stage 1 pressure ulcer to sacrum  Diet Order: NPO   Intake/Output Summary (Last 24 hours) at 08/09/14 1509 Last data filed at 08/09/14 1030  Gross per 24 hour  Intake    230 ml  Output   1520 ml  Net  -1290 ml    Labs:   Recent Labs Lab 08/04/14 0500 08/05/14 0420 08/06/14 0441 08/07/14 0500 08/08/14 0646  NA 140 140 139 136* 136*  K 3.4* 4.3 4.3 4.2 3.9  CL 99 95* 95* 92* 91*  CO2 34* 40* 39* 36* 39*  BUN 22 24* 17 12 9   CREATININE 0.40* 0.42* 0.38* 0.46* 0.47*  CALCIUM 7.4* 8.3* 8.4 8.4 8.2*  MG 1.8 2.1  --   --   1.9  PHOS 2.8 3.6  --   --  4.1  GLUCOSE 105* 147* 114* 111* 111*    CBG (last 3)   Recent Labs  08/08/14 1617 08/09/14 0804 08/09/14 1234  GLUCAP 121* 117* 136*    Scheduled Meds: . antiseptic oral rinse  7 mL Mouth Rinse QID  . budesonide  0.5 mg Nebulization BID  . chlorhexidine  15 mL Mouth Rinse BID  . clonazePAM  1 mg Per Tube BID  . enoxaparin (LOVENOX) injection  40 mg Subcutaneous Daily  . famotidine  20 mg Per Tube QHS  . fentaNYL  50 mcg Transdermal Q72H  . ferrous sulfate  300 mg Per Tube Q12H  . free water  150 mL Per Tube Q12H  . ipratropium-albuterol  3 mL Nebulization Q6H  . metoprolol tartrate  12.5 mg Oral BID  . polyethylene glycol  17 g Oral Daily  . senna-docusate  1 tablet Oral BID  . sodium chloride  10-40 mL Intracatheter Q12H    Continuous Infusions: . feeding supplement (VITAL AF 1.2 CAL) 1,000 mL (08/09/14 1422)    Arthur Holms, RD, LDN Pager #: [phone] After-Hours Pager #: [phone]

## 2014-08-09 NOTE — Progress Notes (Signed)
Received prescreen request for possible inpatient rehab from PT. I noted that pt is currently on the vent and that activity was limited to sitting in bed with elevated heart rate.  I will follow pt's progress. At this time, we would need to see pt off vent support and tolerating activity with therapies before recommending a rehab MD consult (with stable HR).   I will check on pt's status over the next few days and proceed accordingly. Please call me with any questions. Thanks.  Juliann MuleJanine Kymorah Korf, PT Rehabilitation Admissions Coordinator [phone]

## 2014-08-09 NOTE — Progress Notes (Signed)
Pt's heart rate ranges from 129-133. Gave pt medication for anxiety but HR stayed the same. Pt's blood pressure still soft systolic 99. Called Dr. Sung AmabileSimonds and notified and he is coming to see pt.

## 2014-08-10 ENCOUNTER — Inpatient Hospital Stay (HOSPITAL_COMMUNITY): Payer: BC Managed Care – PPO

## 2014-08-10 LAB — BASIC METABOLIC PANEL
Anion gap: 9 (ref 5–15)
BUN: 14 mg/dL (ref 6–23)
CALCIUM: 8.6 mg/dL (ref 8.4–10.5)
CO2: 37 mEq/L — ABNORMAL HIGH (ref 19–32)
CREATININE: 0.62 mg/dL (ref 0.50–1.10)
Chloride: 93 mEq/L — ABNORMAL LOW (ref 96–112)
GLUCOSE: 85 mg/dL (ref 70–99)
Potassium: 4.2 mEq/L (ref 3.7–5.3)
Sodium: 139 mEq/L (ref 137–147)

## 2014-08-10 LAB — CBC
HCT: 26.4 % — ABNORMAL LOW (ref 36.0–46.0)
HEMOGLOBIN: 7.9 g/dL — AB (ref 12.0–15.0)
MCH: 28.7 pg (ref 26.0–34.0)
MCHC: 29.9 g/dL — AB (ref 30.0–36.0)
MCV: 96 fL (ref 78.0–100.0)
Platelets: 332 10*3/uL (ref 150–400)
RBC: 2.75 MIL/uL — ABNORMAL LOW (ref 3.87–5.11)
RDW: 15.9 % — AB (ref 11.5–15.5)
WBC: 9.4 10*3/uL (ref 4.0–10.5)

## 2014-08-10 LAB — LACTIC ACID, PLASMA: Lactic Acid, Venous: 1 mmol/L (ref 0.5–2.2)

## 2014-08-10 MED ORDER — SODIUM CHLORIDE 0.9 % IV BOLUS (SEPSIS)
500.0000 mL | Freq: Once | INTRAVENOUS | Status: AC
  Administered 2014-08-10: 500 mL via INTRAVENOUS

## 2014-08-10 MED ORDER — METOPROLOL TARTRATE 12.5 MG HALF TABLET
12.5000 mg | ORAL_TABLET | Freq: Two times a day (BID) | ORAL | Status: DC
  Administered 2014-08-12 – 2014-08-17 (×5): 12.5 mg via ORAL
  Filled 2014-08-10 (×18): qty 1

## 2014-08-10 NOTE — Progress Notes (Signed)
RT attempted wean.  Pt SPO2 dropped from 93% to 88% sustained on PS of 20.  RT placed back on full support, suctioned out copious amounts of thick tan secretions and SPO2 rose to 93%. Wean attempted again, same results. Pt placed on full support and RT will continue to monitor.

## 2014-08-10 NOTE — Progress Notes (Signed)
eLink Physician-Brief Progress Note Patient Name: Elizabeth [name] DOB: Oct 17, 1956 MRN: [id number]   Date of Service  08/10/2014  HPI/Events of Note  Hypotensive, fluid overloaded asymptomatic  eICU Interventions  chk lactate - if high, will treat aggresively, otherwise monitor     Intervention Category Intermediate Interventions: Hypotension - evaluation and management  ALVA,RAKESH V. 08/10/2014, 4:19 AM

## 2014-08-10 NOTE — Progress Notes (Signed)
Dr. Bard HerbertSimmonds paged and made aware of ongoing hypotension with decreased urine output. Ordered 500 cc ns bolus. Also told about patient complaining of abdominal pain. Abdomen is more taunt, no residuals, and patient has had recent bowel movement. Will monitor. Jacqulynn CadetPotter, Garland Smouse A

## 2014-08-10 NOTE — Progress Notes (Signed)
Dr Vassie LollAlva called back and commented not to give fluids at this time d/t pts edema. Dr Vassie LollAlva is aware of the pts low BP of 65/45 and not wanting to treat at this time.

## 2014-08-10 NOTE — Progress Notes (Signed)
Pt is hypotensive, non symptomatic. Docters have been conservative with fluids and aware of low bp in the 80's systolic current BP is 65/50. Elink Dr Vassie LollAlva called, MD stated he will put in orders.   Nahiem Dredge M. Derrell LollingIngram, RN, BSN 08/10/2014 2:14 AM

## 2014-08-11 ENCOUNTER — Inpatient Hospital Stay (HOSPITAL_COMMUNITY): Payer: BC Managed Care – PPO

## 2014-08-11 MED ORDER — IPRATROPIUM-ALBUTEROL 0.5-2.5 (3) MG/3ML IN SOLN
3.0000 mL | Freq: Four times a day (QID) | RESPIRATORY_TRACT | Status: DC
  Administered 2014-08-11 – 2014-08-18 (×31): 3 mL via RESPIRATORY_TRACT
  Filled 2014-08-11 (×31): qty 3

## 2014-08-11 NOTE — Progress Notes (Signed)
PULMONARY / CRITICAL CARE MEDICINE   Name: Dionne Bucyeresa W Brockel MRN: [id number] DOB: 27-Nov-1955    ADMISSION DATE:  07/20/2014  REFERRING MD :  Ranae PalmsYelverton, ED  CHIEF COMPLAINT:  resp distress  INITIAL PRESENTATION: 58 y/o F with severe COPD on home O2, 3rd  admission in last 2 months who presented 9/23 with AECOPD and bilateral pneumothoraces requiring mechanical ventilation.   STUDIES:   SIGNIFICANT EVENTS: 9/23  Bl chest tubes in ED 9/24  Failed SBT > tachycardic, diaphoretic 9/25  Failed SBT again 9/26  Self extubated and re tubed 9/28  Some weaning high PS 9/30  CT's to -20 cm sxn, no air leak, failed SBT in am  10/01 am hrs>>>mucous plug with pea 6 min arrest 100/7 back on MV for hypercapnia after ATC x 36h  10/08 L chest tube removed 10/11 R chest tube malpositioned. Recurrent R PTX. New R chest tube placed 10/11 received one unit PRBCs for Hgb 6.6 10/12 No ptx on CXR. No air leak on CXR. No progress weaning  SUBJECTIVE:   Calm. RASS -1. + F/C  VITAL SIGNS: Temp:  [98 F (36.7 C)-98.9 F (37.2 C)] 98.8 F (37.1 C) (10/15 0825) Pulse Rate:  [101-114] 114 (10/15 0825) Resp:  [12-24] 16 (10/15 0825) BP: (75-122)/(48-69) 122/69 mmHg (10/15 0825) SpO2:  [92 %-100 %] 100 % (10/15 0825) FiO2 (%):  [40 %] 40 % (10/15 0825) Weight:  [76.5 kg (168 lb 10.4 oz)] 76.5 kg (168 lb 10.4 oz) (10/15 0433)  VENTILATOR SETTINGS: Vent Mode:  [-] PRVC FiO2 (%):  [40 %] 40 % Set Rate:  [12 bmp] 12 bmp Vt Set:  [550 mL] 550 mL PEEP:  [3 cmH20] 3 cmH20 Plateau Pressure:  [21 cmH20-26 cmH20] 21 cmH20  INTAKE / OUTPUT:  Intake/Output Summary (Last 24 hours) at 08/11/14 1003 Last data filed at 08/11/14 0826  Gross per 24 hour  Intake   1110 ml  Output   1010 ml  Net    100 ml    PHYSICAL EXAMINATION: Gen: NAD HEENT: trach site clean PULM: distant BS, no adventitious sounds, no air leak CV: Reg, no M  AB: Soft, +BS Ext: warm, symmetric edema Neuro: diffusely weak, no focal  defcits   LABS: I have reviewed all of today's lab results. Relevant abnormalities are discussed in the A/P section  CXR: no pneumothorax  ASSESSMENT / PLAN:  PULMONARY ETT 9/23 >> 9/26, 9/26 >> 10/02 Trach (DF) 10/02 >>   A:  Acute on chronic hypoxic/hypercapnic respiratory failure AECOPD  Bilateral spontaneous pneumothoraces Hx of GOLD 4 COPD with bullous emphysema Mucus plug causing arrest 10/01. P:   Wean in PS as tolerated Cont vent bundle She will need LTACH No reason for duplicate beta agonist therapy - DC arformoterol Chest tube to water seal 10/12  NEUROLOGIC A:   Anxiety, controlled Pain. controlled P:   Cont Duragesic and scheduled clonazepam  CARDIOVASCULAR A:  Hypotension, resolved. PAF with RVR - resolved  P:  Holding metoprolol Avoid excessive beta agonist therapy Nml lactate reassuring  RENAL A:  Hypervolemia, improving. Hypernatremia, resolved Edema due to low oncotic pressure P:   Monitor BMET intermittently Monitor I/Os Correct electrolytes as indicated  GASTROINTESTINAL A:   No issues P:   SUP: change PPI to famotidine 10/12 Cont TFs  HEMATOLOGIC A:   ICU acquired anemia without overt bleeding P:  DVT px: LMWH  Monitor CBC intermittently Transfuse per usual ICU guidelines   INFECTIOUS A:   Purulent bronchitis -  completed course of abx P:   monitor  ENDOCRINE A:   Steroid associated hyperglycemia, resolved P:   DC SSI Monitor glu on chem panels Resume SSI for glu > 180   Cyril Mourningakesh Kacee Koren MD. FCCP. [facility] Pager [phone] If no response call [phone]

## 2014-08-11 NOTE — Progress Notes (Signed)
Physical Therapy Treatment Patient Details Name: Elizabeth [name] MRN: [id number] DOB: 10-Jun-1956 Today's Date: 08/11/2014    History of Present Illness pt presents with Respiratory Failure requiring ventilation, Bil Pneumothoraces, and COPD exacerbation.  pt with 4 recent admits for respiratory difficulties.      PT Comments    Pt continues to be agreeable to chair positioning in bed.  HR remained 105 - 119 throughout session with BPs 84/56 resting in bed, 96/59 sitting at 45*, 93/54 sitting at 60*, and 88/59 after sitting 15mins.  Pt indicates feeling comfortable in sitting without dizziness.  Pt's respiratory status will limit D/C plan and pt may be most appropriate for Ltach level of care.  Will continue to follow.    Follow Up Recommendations  LTACH     Equipment Recommendations  None recommended by PT    Recommendations for Other Services       Precautions / Restrictions Precautions Precautions: Fall Precaution Comments: Vent, NG tube, Increase HR Restrictions Weight Bearing Restrictions: No    Mobility  Bed Mobility Overal bed mobility: Needs Assistance             General bed mobility comments: pt placed in chair position initially at 45* and then 60* of sitting.  pt's BP remained stable and pt asymptomatic throughout.  pt's HR remained 105 - 119 through session.    Transfers                    Ambulation/Gait                 Stairs            Wheelchair Mobility    Modified Rankin (Stroke Patients Only)       Balance                                    Cognition Arousal/Alertness: Awake/alert Behavior During Therapy: WFL for tasks assessed/performed Overall Cognitive Status: Difficult to assess                      Exercises      General Comments        Pertinent Vitals/Pain Pain Assessment: No/denies pain    Home Living                      Prior Function            PT Goals  (current goals can now be found in the care plan section) Acute Rehab PT Goals Patient Stated Goal: None stated. PT Goal Formulation: With patient/family Time For Goal Achievement: 08/23/14 Potential to Achieve Goals: Good Progress towards PT goals: Progressing toward goals    Frequency  Min 2X/week    PT Plan Discharge plan needs to be updated;Frequency needs to be updated    Co-evaluation             End of Session Equipment Utilized During Treatment:  (Vent) Activity Tolerance: Treatment limited secondary to medical complications (Comment) Patient left: in bed;with call bell/phone within reach     Time: 0900-0928 PT Time Calculation (min): 28 min  Charges:  $Therapeutic Activity: 23-37 mins                    G CodesSunny Schlein:      Geramy Lamorte F, [place] [phone] 08/11/2014, 10:07 AM

## 2014-08-12 MED ORDER — FREE WATER
200.0000 mL | Freq: Three times a day (TID) | Status: DC
  Administered 2014-08-12 – 2014-08-17 (×14): 200 mL

## 2014-08-12 NOTE — Progress Notes (Signed)
PULMONARY / CRITICAL CARE MEDICINE   Name: Elizabeth [name] MRN: [id number] DOB: December 17, 1955    ADMISSION DATE:  07/20/2014  REFERRING MD :  Ranae PalmsYelverton, ED  CHIEF COMPLAINT:  resp distress  INITIAL PRESENTATION: 58 y/o F with severe COPD on home O2, 3rd  admission in last 2 months who presented 9/23 with AECOPD and bilateral pneumothoraces requiring mechanical ventilation.   STUDIES:   SIGNIFICANT EVENTS: 9/23  Bl chest tubes in ED 9/24  Failed SBT > tachycardic, diaphoretic 9/25  Failed SBT again 9/26  Self extubated and re tubed 9/28  Some weaning high PS 9/30  CT's to -20 cm sxn, no air leak, failed SBT in am  10/01 am hrs>>>mucous plug with pea 6 min arrest 100/7 back on MV for hypercapnia after ATC x 36h  10/08 L chest tube removed 10/11 R chest tube malpositioned. Recurrent R PTX. New R chest tube placed 10/11 received one unit PRBCs for Hgb 6.6 10/12 No ptx on CXR. No air leak on CXR. No progress weaning  SUBJECTIVE:   Calm. RASS 0, failing weaning attempts due to desatn , poor trigger  VITAL SIGNS: Temp:  [98.3 F (36.8 C)-99.8 F (37.7 C)] 98.3 F (36.8 C) (10/16 1220) Pulse Rate:  [101-127] 101 (10/16 1148) Resp:  [12-29] 12 (10/16 1148) BP: (84-121)/(50-77) 121/68 mmHg (10/16 1148) SpO2:  [95 %-100 %] 97 % (10/16 1148) FiO2 (%):  [40 %] 40 % (10/16 1148)  VENTILATOR SETTINGS: Vent Mode:  [-] PRVC FiO2 (%):  [40 %] 40 % Set Rate:  [12 bmp] 12 bmp Vt Set:  [550 mL] 550 mL PEEP:  [3 cmH20] 3 cmH20 Plateau Pressure:  [22 cmH20-28 cmH20] 24 cmH20  INTAKE / OUTPUT:  Intake/Output Summary (Last 24 hours) at 08/12/14 1306 Last data filed at 08/12/14 0417  Gross per 24 hour  Intake    170 ml  Output    695 ml  Net   -525 ml    PHYSICAL EXAMINATION: Gen: NAD HEENT: trach site clean PULM: distant BS, no adventitious sounds, no air leak CV: Reg, no M  AB: Soft, +BS Ext: warm, symmetric edema Neuro: diffusely weak, no focal defcits   LABS: I have  reviewed all of today's lab results. Relevant abnormalities are discussed in the A/P section  CXR: no pneumothorax  ASSESSMENT / PLAN:  PULMONARY ETT 9/23 >> 9/26, 9/26 >> 10/02 Trach (DF) 10/02 >>   A:  Acute on chronic hypoxic/hypercapnic respiratory failure AECOPD  Bilateral spontaneous pneumothoraces -chest tubes out Hx of GOLD 4 COPD with bullous emphysema Mucus plug causing arrest 10/01. P:   Wean in PS as tolerated -expect prolonged wean Cont vent bundle She will need LTACH duonebs   NEUROLOGIC A:   Anxiety, controlled Pain. controlled P:   Cont Duragesic and scheduled clonazepam  CARDIOVASCULAR A:  Hypotension, resolved. PAF with RVR - resolved  P:  Holding metoprolol Avoid excessive beta agonist therapy Nml lactate reassuring  RENAL A:  Hypervolemia, improving. Hypernatremia, resolved Edema due to low oncotic pressure P:   Monitor BMET intermittently Monitor I/Os Correct electrolytes as indicated  GASTROINTESTINAL A:   No issues P:   SUP: famotidine  Cont TFs  HEMATOLOGIC A:   ICU acquired anemia without overt bleeding P:  DVT px: LMWH  Monitor CBC intermittently Transfuse per usual ICU guidelines   INFECTIOUS A:   Purulent bronchitis - completed course of abx P:   monitor  ENDOCRINE A:   Steroid associated hyperglycemia, resolved P:  DC SSI Monitor glu on chem panels Resume SSI for glu > 180  Summary - Stable for dc to LTAC, expect prolonged wean, if does not liberate from vent in 6 wks options would include home vent vs vent SNF. Updated husband at bedside   Cyril Mourningakesh Alva MD. Tonny BollmanFCCP. [facility] Pager [phone] If no response call [phone]

## 2014-08-12 NOTE — Progress Notes (Signed)
RT Note: Patient was placed on Cpap/Ps by Dr. Vassie LollAlva and patient went apneic and desaturated to the low 70's. Patient was placed back on her rest mode and given 100% O2 and her saturation has come up to 100%. Patient was anxious at the time but is now calm and breathing well. Eber Jonesarolyn, RN is also at bedside. Dr. Vassie LollAlva was notified and is aware of the incident. Rt will continue to monitor.

## 2014-08-12 NOTE — Progress Notes (Signed)
Rehab admissions - I have been following pt's case and noted that pt's participation with therapies has been limited to sitting in bed with vent support. It is not appropriate to pursue rehab consult at this time due to her current need for vent support and extremely limited activity tolerance. I also noted that PT is now recommending LTAC.  If pt's medical status and activity tolerance improves, please contact me or an admission coordinator. I will now sign off pt's case.  Thanks.  Juliann MuleJanine Kyle Luppino, PT Rehabilitation Admissions Coordinator [phone]

## 2014-08-13 ENCOUNTER — Inpatient Hospital Stay (HOSPITAL_COMMUNITY): Payer: BC Managed Care – PPO

## 2014-08-13 DIAGNOSIS — J939 Pneumothorax, unspecified: Secondary | ICD-10-CM | POA: Diagnosis not present

## 2014-08-13 DIAGNOSIS — E43 Unspecified severe protein-calorie malnutrition: Secondary | ICD-10-CM

## 2014-08-13 DIAGNOSIS — I4891 Unspecified atrial fibrillation: Secondary | ICD-10-CM | POA: Diagnosis not present

## 2014-08-13 LAB — BASIC METABOLIC PANEL
Anion gap: 5 (ref 5–15)
BUN: 11 mg/dL (ref 6–23)
CO2: 38 mEq/L — ABNORMAL HIGH (ref 19–32)
Calcium: 8.7 mg/dL (ref 8.4–10.5)
Chloride: 94 mEq/L — ABNORMAL LOW (ref 96–112)
Creatinine, Ser: 0.37 mg/dL — ABNORMAL LOW (ref 0.50–1.10)
GFR calc Af Amer: >90 mL/min (ref >90)
GFR calc non Af Amer: >90 mL/min (ref >90)
Glucose, Bld: 113 mg/dL — ABNORMAL HIGH (ref 70–99)
POTASSIUM: 4.1 meq/L (ref 3.7–5.3)
SODIUM: 137 meq/L (ref 137–147)

## 2014-08-13 LAB — CBC
HCT: 25.9 % — ABNORMAL LOW (ref 36.0–46.0)
Hemoglobin: 7.6 g/dL — ABNORMAL LOW (ref 12.0–15.0)
MCH: 28.1 pg (ref 26.0–34.0)
MCHC: 29.3 g/dL — ABNORMAL LOW (ref 30.0–36.0)
MCV: 95.9 fL (ref 78.0–100.0)
PLATELETS: 562 10*3/uL — AB (ref 150–400)
RBC: 2.7 MIL/uL — AB (ref 3.87–5.11)
RDW: 16.4 % — ABNORMAL HIGH (ref 11.5–15.5)
WBC: 6.9 10*3/uL (ref 4.0–10.5)

## 2014-08-13 MED ORDER — TRAMADOL 5 MG/ML ORAL SUSPENSION: 50.0000 mg | Freq: Four times a day (QID) | ORAL | Status: DC | PRN

## 2014-08-13 MED ORDER — TRAMADOL HCL 50 MG PO TABS
50.0000 mg | ORAL_TABLET | Freq: Four times a day (QID) | ORAL | Status: DC | PRN
  Administered 2014-08-14 – 2014-08-18 (×5): 50 mg via ORAL
  Filled 2014-08-13 (×5): qty 1

## 2014-08-13 NOTE — Progress Notes (Signed)
PULMONARY / CRITICAL CARE MEDICINE   Name: Elizabeth [name] MRN: [id number] DOB: Dec 28, 1955    ADMISSION DATE:  07/20/2014  REFERRING MD :  Ranae PalmsYelverton, ED  CHIEF COMPLAINT:  resp distress  INITIAL PRESENTATION: 58 y/o F with severe COPD on home O2, 3rd  admission in last 2 months who presented 9/23 with AECOPD and bilateral pneumothoraces requiring mechanical ventilation.   STUDIES:   SIGNIFICANT EVENTS: 9/23  Bl chest tubes in ED 9/24  Failed SBT > tachycardic, diaphoretic 9/25  Failed SBT again 9/26  Self extubated and re tubed 9/28  Some weaning high PS 9/30  CT's to -20 cm sxn, no air leak, failed SBT in am  10/01 am hrs>>>mucous plug with pea 6 min arrest 100/7 back on MV for hypercapnia after ATC x 36h  10/08 L chest tube removed 10/11 R chest tube malpositioned. Recurrent R PTX. New R chest tube placed 10/11 received one unit PRBCs for Hgb 6.6 10/12 No ptx on CXR. No air leak on CXR. No progress weaning  SUBJECTIVE:   Calm. RASS 0, not weaning  VITAL SIGNS: Temp:  [98.3 F (36.8 C)-99.1 F (37.3 C)] 98.4 F (36.9 C) (10/17 0809) Pulse Rate:  [101-128] 128 (10/17 0809) Resp:  [12-28] 28 (10/17 0809) BP: (91-121)/(58-84) 117/84 mmHg (10/17 0809) SpO2:  [95 %-100 %] 98 % (10/17 0819) FiO2 (%):  [40 %] 40 % (10/17 0819)  VENTILATOR SETTINGS: Vent Mode:  [-] AC FiO2 (%):  [40 %] 40 % Set Rate:  [12 bmp] 12 bmp Vt Set:  [550 mL] 550 mL PEEP:  [3 cmH20] 3 cmH20 Plateau Pressure:  [23 cmH20-29 cmH20] 24 cmH20  INTAKE / OUTPUT:  Intake/Output Summary (Last 24 hours) at 08/13/14 0823 Last data filed at 08/13/14 [id number]  Gross per 24 hour  Intake      0 ml  Output   2651 ml  Net  -2651 ml    PHYSICAL EXAMINATION: Gen: NAD HEENT: trach site clean PULM: distant BS, no adventitious sounds, no air leak CV: Reg, no M  AB: Soft, +BS Ext: warm, symmetric edema Neuro: diffusely weak, no focal defcits   LABS: I have reviewed all of today's lab results. Relevant  abnormalities are discussed in the A/P section  CXR: no film 10/17  ASSESSMENT / PLAN:  PULMONARY ETT 9/23 >> 9/26, 9/26 >> 10/02 Trach (DF) 10/02 >>   A:  Acute on chronic hypoxic/hypercapnic respiratory failure AECOPD  Bilateral spontaneous pneumothoraces -chest tubes out Hx of GOLD 4 COPD with bullous emphysema Mucus plug causing arrest 10/01. P:   Wean in PS as tolerated -expect prolonged wean Cont vent bundle She will need LTACH duonebs   NEUROLOGIC A:   Anxiety, controlled Pain. controlled P:   Cont Duragesic and scheduled clonazepam Add prn tramadol  CARDIOVASCULAR A:  Hypotension, resolved. PAF with RVR - resolved  P:  On low dose metoprolol Avoid excessive beta agonist therapy   RENAL A:  Hypervolemia, improving. Hypernatremia, resolved Edema due to low oncotic pressure P:   Monitor BMET intermittently Monitor I/Os Correct electrolytes as indicated  GASTROINTESTINAL A:   No issues P:   SUP: famotidine  Cont TFs  HEMATOLOGIC A:   ICU acquired anemia without overt bleeding P:  DVT px: LMWH  Monitor CBC intermittently Transfuse per usual ICU guidelines   INFECTIOUS A:   Purulent bronchitis - completed course of abx P:   monitor  ENDOCRINE A:   Steroid associated hyperglycemia, resolved P:   DC  SSI Monitor glu on chem panels Resume SSI for glu > 180  Summary - Stable for dc to LTAC, expect prolonged wean, if does not liberate from vent in 6 wks options would include home vent vs vent SNF.    Dorcas Carrowatrick WrightMD Beeper  [phone]  Cell  [phone]  If no response or cell goes to voicemail, call beeper [phone] 8:25 AM 08/13/2014

## 2014-08-13 NOTE — Progress Notes (Signed)
Patient's trach sutures removed per protocol.  Patient tolerated well.  Site was cleansed and dried prior to removal.  RT to continue to monitor.

## 2014-08-14 LAB — GLUCOSE, CAPILLARY
GLUCOSE-CAPILLARY: 109 mg/dL — AB (ref 70–99)
Glucose-Capillary: 121 mg/dL — ABNORMAL HIGH (ref 70–99)

## 2014-08-14 MED ORDER — WHITE PETROLATUM GEL
Status: AC
  Administered 2014-08-14: 0.2
  Filled 2014-08-14: qty 5

## 2014-08-14 MED ORDER — FUROSEMIDE 10 MG/ML IJ SOLN
40.0000 mg | Freq: Three times a day (TID) | INTRAMUSCULAR | Status: AC
  Administered 2014-08-14 (×2): 40 mg via INTRAVENOUS
  Filled 2014-08-14 (×2): qty 4

## 2014-08-14 NOTE — Progress Notes (Addendum)
PULMONARY / CRITICAL CARE MEDICINE   Name: Elizabeth [name] MRN: [id number] DOB: [birth year]    ADMISSION DATE:  07/20/2014  REFERRING MD :  Ranae PalmsYelverton, ED  CHIEF COMPLAINT:  resp distress  INITIAL PRESENTATION: 58 y/o F with severe COPD on home O2, 3rd  admission in last 2 months who presented 9/23 with AECOPD and bilateral pneumothoraces requiring mechanical ventilation.   STUDIES:   SIGNIFICANT EVENTS: 9/23  Bl chest tubes in ED 9/24  Failed SBT > tachycardic, diaphoretic 9/25  Failed SBT again 9/26  Self extubated and re tubed 9/28  Some weaning high PS 9/30  CT's to -20 cm sxn, no air leak, failed SBT in am  10/01 am hrs>>>mucous plug with pea 6 min arrest 100/7 back on MV for hypercapnia after ATC x 36h  10/08 L chest tube removed 10/11 R chest tube malpositioned. Recurrent R PTX. New R chest tube placed 10/11 received one unit PRBCs for Hgb 6.6 10/12 No ptx on CXR. No air leak on CXR. No progress weaning  SUBJECTIVE:   Calm. RASS 0, not weaning  VITAL SIGNS: Temp:  [98.4 F (36.9 C)-100 F (37.8 C)] 98.4 F (36.9 C) (10/18 0752) Pulse Rate:  [104-127] 119 (10/18 0752) Resp:  [12-40] 40 (10/18 0752) BP: (80-122)/(52-67) 122/66 mmHg (10/18 0752) SpO2:  [89 %-100 %] 89 % (10/18 0807) FiO2 (%):  [30 %-40 %] 30 % (10/18 0807)  VENTILATOR SETTINGS: Vent Mode:  [-] PRVC FiO2 (%):  [30 %-40 %] 30 % Set Rate:  [12 bmp] 12 bmp Vt Set:  [550 mL] 550 mL PEEP:  [3 cmH20] 3 cmH20 Plateau Pressure:  [24 cmH20-25 cmH20] 24 cmH20  INTAKE / OUTPUT:  Intake/Output Summary (Last 24 hours) at 08/14/14 0830 Last data filed at 08/14/14 0755  Gross per 24 hour  Intake      0 ml  Output    550 ml  Net   -550 ml    PHYSICAL EXAMINATION: Gen: NAD HEENT: trach site clean PULM: distant BS, no adventitious sounds, no air leak CV: Reg, no M  AB: Soft, +BS Ext: warm, symmetric edema Neuro: diffusely weak, no focal defcits   LABS: I have reviewed all of today's lab results.  Relevant abnormalities are discussed in the A/P section  CXR: no film 10/17  ASSESSMENT / PLAN:  PULMONARY ETT 9/23 >> 9/26, 9/26 >> 10/02 Trach (DF) 10/02 >>   A:  Acute on chronic hypoxic/hypercapnic respiratory failure AECOPD  Bilateral spontaneous pneumothoraces -chest tubes out Hx of GOLD 4 COPD with bullous emphysema Mucus plug causing arrest 10/01. P:   Not weanable, cannot even trigger PS with shallow breath efforts Cont vent bundle She will need vent SNF. duonebs   NEUROLOGIC A:   Anxiety, controlled Pain. controlled P:   Cont Duragesic and scheduled clonazepam Add prn tramadol  CARDIOVASCULAR A:  Hypotension, resolved. PAF with RVR - resolved  P:  On low dose metoprolol Avoid excessive beta agonist therapy   RENAL A:  Hypervolemia, improving. Hypernatremia, resolved Edema due to low oncotic pressure P:   Monitor BMET intermittently Monitor I/Os Correct electrolytes as indicated Start lasix 40mg  q8h  GASTROINTESTINAL A:   No issues P:   SUP: famotidine  Cont TFs  HEMATOLOGIC A:   ICU acquired anemia without overt bleeding P:  DVT px: LMWH  Monitor CBC intermittently Transfuse per usual ICU guidelines   INFECTIOUS A:   Purulent bronchitis - completed course of abx P:   monitor  ENDOCRINE  A:   Steroid associated hyperglycemia, resolved P:   DC SSI Monitor glu on chem panels Resume SSI for glu > 180  Summary -not weanable, needs vent snf or hospice/palliative care  I called for palliative care to see this pt for more GOC with pt/family.   Dorcas Carrowatrick WrightMD Beeper  [phone]  Cell  [phone]  If no response or cell goes to voicemail, call beeper [phone] 8:30 AM 08/14/2014

## 2014-08-15 ENCOUNTER — Inpatient Hospital Stay (HOSPITAL_COMMUNITY): Payer: BC Managed Care – PPO

## 2014-08-15 LAB — GLUCOSE, CAPILLARY
GLUCOSE-CAPILLARY: 132 mg/dL — AB (ref 70–99)
Glucose-Capillary: 135 mg/dL — ABNORMAL HIGH (ref 70–99)
Glucose-Capillary: 121 mg/dL — ABNORMAL HIGH (ref 70–99)
Glucose-Capillary: 126 mg/dL — ABNORMAL HIGH (ref 70–99)

## 2014-08-15 MED ORDER — ALTEPLASE 2 MG IJ SOLR
4.0000 mg | Freq: Once | INTRAMUSCULAR | Status: AC
  Administered 2014-08-15: 4 mg
  Filled 2014-08-15: qty 4

## 2014-08-15 MED ORDER — FUROSEMIDE 10 MG/ML IJ SOLN: 40.0000 mg | Freq: Two times a day (BID) | INTRAMUSCULAR | Status: DC

## 2014-08-15 MED ORDER — POTASSIUM CHLORIDE 20 MEQ/15ML (10%) PO LIQD
20.0000 meq | Freq: Once | ORAL | Status: DC
  Filled 2014-08-15: qty 15

## 2014-08-15 MED ORDER — ALTEPLASE 2 MG IJ SOLR: 2.0000 mg | Freq: Once | INTRAMUSCULAR | Status: DC

## 2014-08-15 MED ORDER — GUAIFENESIN ER 600 MG PO TB12
1200.0000 mg | ORAL_TABLET | Freq: Two times a day (BID) | ORAL | Status: DC
  Administered 2014-08-15 (×2): 1200 mg via ORAL
  Filled 2014-08-15 (×4): qty 2

## 2014-08-15 NOTE — Progress Notes (Addendum)
Patient has increased ventilator peak pressure of 57.  RT changed filter and HME on ventilator and suctioned patient, peak pressures remained increased.  RN aware. MD aware of increased peak pressure.

## 2014-08-15 NOTE — Progress Notes (Signed)
NUTRITION FOLLOW UP  Intervention:   Continue current TF regimen RD to follow for nutrition care plan  Nutrition Dx:   Inadequate oral intake related to inability to eat as evidenced by NPO status, ongoing  Goal:   Pt to meet >/= 90% of their estimated nutrition needs, met  Monitor:   TF regimen & tolerance, respiratory status, weight, labs, I/O's  Assessment:   3rd admission in last 2 months for 58 year old with severe COPD on home oxygen but acute respiratory failure due to COPD exacerbation complicated by bilateral pneumothoraces and mechanical ventilation.  Patient transferred from 80M-MICU to 2C-Stepdown 10/11.  Patient is currently on ventilator support via trach MV: 6.6 L/min Temp (24hrs), Avg:98.8 F (37.1 C), Min:98.5 F (36.9 C), Max:98.9 F (37.2 C)   Vital AF 1.2 formula currently infusing at 55 ml/hr via NGT providing 1584 kcals, 99 gm protein, 1071 ml of free water.  Noted pt will possibly need PEG tube placement.  Goals of care meeting with Palliative Care Team scheduled for Wednesday, 10/21.  Height: Ht Readings from Last 1 Encounters:  08/07/14 5' 5"  (1.651 m)    Weight Status:   Wt Readings from Last 1 Encounters:  08/11/14 168 lb 10.4 oz (76.5 kg)  07/26/14  156 lb 15.5 oz (71.2 kg)  07/20/14  134 lb 0.6 oz (60.8 kg)    Body mass index is 28.07 kg/(m^2).  Re-estimated needs:  Kcal: 1500-1650 Protein: 85-100 gm Fluid: 1.5-1.6 L  Skin: Stage 1 pressure ulcer to sacrum  Diet Order: NPO   Intake/Output Summary (Last 24 hours) at 08/15/14 1233 Last data filed at 08/15/14 0800  Gross per 24 hour  Intake   1355 ml  Output   2575 ml  Net  -1220 ml    Labs:   Recent Labs Lab 08/10/14 0655 08/13/14 0520  NA 139 137  K 4.2 4.1  CL 93* 94*  CO2 37* 38*  BUN 14 11  CREATININE 0.62 0.37*  CALCIUM 8.6 8.7  GLUCOSE 85 113*    CBG (last 3)   Recent Labs  08/14/14 0436 08/14/14 1947 08/15/14 0821  GLUCAP 109* 121* 135*     Scheduled Meds: . antiseptic oral rinse  7 mL Mouth Rinse QID  . budesonide  0.5 mg Nebulization BID  . chlorhexidine  15 mL Mouth Rinse BID  . clonazePAM  1 mg Per Tube BID  . enoxaparin (LOVENOX) injection  40 mg Subcutaneous Daily  . famotidine  20 mg Per Tube QHS  . fentaNYL  50 mcg Transdermal Q72H  . ferrous sulfate  300 mg Per Tube Q12H  . free water  200 mL Per Tube 3 times per day  . furosemide  40 mg Intravenous Q12H  . ipratropium-albuterol  3 mL Nebulization QID  . metoprolol tartrate  12.5 mg Oral BID  . polyethylene glycol  17 g Oral Daily  . potassium chloride  20 mEq Per Tube Once  . senna-docusate  1 tablet Oral BID  . sodium chloride  10-40 mL Intracatheter Q12H    Continuous Infusions: . feeding supplement (VITAL AF 1.2 CAL) 1,000 mL (08/15/14 0024)    Arthur Holms, RD, LDN Pager #: [phone] After-Hours Pager #: [phone]

## 2014-08-15 NOTE — Progress Notes (Signed)
I will meet with Ms. Austad and her husband and sister for goals of care Wednesday 08/17/14 at 1030 am. I would recommend to continue with placement for LTAC/vent SNF from preliminary discussion with Ms. W[place].   Yong ChannelAlicia Levon Penning, NP Palliative Medicine Team Pager # [phone] (M-F 8a-5p) Team Phone # [phone] (Nights/Weekends)

## 2014-08-15 NOTE — Progress Notes (Signed)
eLink Physician-Brief Progress Note Patient Name: Elizabeth [name] DOB: 1956/08/17 MRN: [id number]   Date of Service  08/15/2014  HPI/Events of Note  Abdominal pain and distension   eICU Interventions  Repeat KUB ordered     Intervention Category Intermediate Interventions: Abdominal pain - evaluation and management  [name] S. 08/15/2014, 5:55 PM

## 2014-08-15 NOTE — Progress Notes (Signed)
Pt c/o ABD pain. Noted hypoactive bowel sounds - no residuals from Panda feeding tube. HOB up 30 degrees. [facility]. Orders received

## 2014-08-15 NOTE — Progress Notes (Signed)
Patients family has requested that one of them be present during the time of any Spontaneous Breathing Trials.  RN is aware and this information will be passed on in report to night shift.

## 2014-08-15 NOTE — Progress Notes (Signed)
I spoke today briefly with Ms. Elizabeth [name] regarding palliative care and presented her with some decisions and options that may be important for her to consider. She agrees and would like to discuss further together with her husband. I have left a message for [name] to set a time to meet with them together. Thank you for this consult.   Yong ChannelAlicia Matteo Banke, NP Palliative Medicine Team Pager # [phone] (M-F 8a-5p) Team Phone # [phone] (Nights/Weekends)

## 2014-08-15 NOTE — Progress Notes (Signed)
PULMONARY / CRITICAL CARE MEDICINE   Name: Elizabeth [name] MRN: [id number] DOB: 1956/03/20    ADMISSION DATE:  07/20/2014  REFERRING MD :  Ranae PalmsYelverton, ED  CHIEF COMPLAINT:  resp distress  INITIAL PRESENTATION: 58 y/o F with severe COPD on home O2, 3rd  admission in last 2 months who presented 9/23 with AECOPD and bilateral pneumothoraces requiring mechanical ventilation.   STUDIES:   SIGNIFICANT EVENTS: 9/23  Bl chest tubes in ED 9/24  Failed SBT > tachycardic, diaphoretic 9/25  Failed SBT again 9/26  Self extubated and re tubed 9/28  Some weaning high PS 9/30  CT's to -20 cm sxn, no air leak, failed SBT in am  10/01  am hrs>>>mucous plug with pea 6 min arrest 100/7  back on MV for hypercapnia after ATC x 36h  10/08  L chest tube removed 10/11  R chest tube malpositioned. Recurrent R PTX. New R chest tube placed 10/11  received one unit PRBCs for Hgb 6.6 10/12  No ptx on CXR. No air leak on CXR. No progress weaning 10/19  Failed SBT (low volumes, anxiety)  SUBJECTIVE:  Failed SBT quickly, anxiety of patient & family contributing.  Thick secretions from trach  VITAL SIGNS: Temp:  [98.5 F (36.9 C)-98.9 F (37.2 C)] 98.9 F (37.2 C) (10/19 0800) Pulse Rate:  [103-122] 116 (10/19 1041) Resp:  [16-34] 16 (10/19 0747) BP: (88-123)/(45-71) 88/45 mmHg (10/19 1041) SpO2:  [88 %-100 %] 98 % (10/19 0747) FiO2 (%):  [30 %-40 %] 40 % (10/19 0747)  VENTILATOR SETTINGS: Vent Mode:  [-] PRVC FiO2 (%):  [30 %-40 %] 40 % Set Rate:  [12 bmp] 12 bmp Vt Set:  [550 mL] 550 mL PEEP:  [3 cmH20] 3 cmH20 Plateau Pressure:  [22 cmH20-29 cmH20] 27 cmH20  INTAKE / OUTPUT:  Intake/Output Summary (Last 24 hours) at 08/15/14 1155 Last data filed at 08/15/14 0800  Gross per 24 hour  Intake   1355 ml  Output   2775 ml  Net  -1420 ml    PHYSICAL EXAMINATION: Gen: chronically ill in NAD HEENT: #6 trach site c/d/i PULM: distant BS, no adventitious sounds, no air leak CV: Reg, no M  AB:  Soft, +BS Ext: warm, symmetric edema Neuro: diffusely weak, no focal defcits   LABS:  Recent Labs Lab 08/10/14 0655 08/13/14 0520  HGB 7.9* 7.6*  HCT 26.4* 25.9*  WBC 9.4 6.9  PLT 332 562*    Recent Labs Lab 08/10/14 0655 08/13/14 0520  NA 139 137  K 4.2 4.1  CL 93* 94*  CO2 37* 38*  GLUCOSE 85 113*  BUN 14 11  CREATININE 0.62 0.37*  CALCIUM 8.6 8.7     CXR: no film 10/19  ASSESSMENT / PLAN:  PULMONARY ETT 9/23 >> 9/26, 9/26 >> 10/02 Trach (DF) 10/02 >>   A:  Acute on chronic hypoxic/hypercapnic respiratory failure AECOPD  Bilateral spontaneous pneumothoraces - chest tubes out Hx of GOLD 4 COPD with bullous emphysema Mucus plug causing arrest 10/01. P:   Has not made progress with weaning Continue daily attempt at SBT  Cont vent bundle She will need vent SNF. Duonebs QID, Budesonide BID Recommend Palliative Care discussion with patient regarding likely long term vent needs  NEUROLOGIC A:   Anxiety, controlled Pain. controlled P:   Cont Duragesic and scheduled clonazepam PRN tramadol for pain  CARDIOVASCULAR A:  Hypotension, resolved. PAF with RVR - resolved  Soft BP - 10/19 post lasix P:  Metoprolol 12.5  BID  Avoid excessive beta agonist therapy  RENAL A:  Hypervolemia, improving. Hypernatremia, resolved Edema due to low oncotic pressure P:   Monitor BMET intermittently Monitor I/Os Correct electrolytes as indicated Hold further lasix with soft bp  GASTROINTESTINAL A:   Ventilator Associated Dysphagia  P:   SUP: famotidine  Cont TFs Ask IR to eval for PEG placement, suspect family will want long term placement  HEMATOLOGIC A:   ICU acquired anemia without overt bleeding P:  DVT px: LMWH  Monitor CBC intermittently Transfuse per usual ICU guidelines Ferrous sulfate  INFECTIOUS A:   Purulent bronchitis - completed course of abx P:   Monitor fever curve / leukocytosis   ENDOCRINE A:   Steroid associated  hyperglycemia, resolved. P:   Monitor glu on chem panels Resume SSI for glu > 180  Today's Summary 20- 58 y/o F with end-stage bullous COPD with bilateral pneumothoraces s/p chest tubes & trach.  Has not made progress with weaning.  Needs vent SNF or hospice/palliative care.  Palliative care to see this pt for more GOC with pt/family. IR to eval for potential PEG placement - suspect family will want full support given pts intact cognition.     Canary BrimBrandi Ollis, NP-C [facility] Pgr: [phone] or [phone]   11:55 AM 08/15/2014  Attending  I have seen and examined the patient with nurse practitioner/resident and agree with the note above. Brandi and I formulated the plan together.  Lungs> diminished breath sounds with wheezing Agree she appears a bit volume up  When I speak with her this morning it sounds like anxiety is playing a big role with vent weaning Would like for palliative medicine to suggest some sort of non-sedating anxiolytic  Heber CarolinaBrent McQuaid, MD [facility] [facility] Pager: [phone] Cell: [phone] If no response, call [phone]

## 2014-08-16 ENCOUNTER — Inpatient Hospital Stay (HOSPITAL_COMMUNITY): Payer: BC Managed Care – PPO

## 2014-08-16 DIAGNOSIS — J432 Centrilobular emphysema: Secondary | ICD-10-CM

## 2014-08-16 DIAGNOSIS — K567 Ileus, unspecified: Secondary | ICD-10-CM

## 2014-08-16 LAB — CBC
HEMATOCRIT: 26.2 % — AB (ref 36.0–46.0)
Hemoglobin: 7.8 g/dL — ABNORMAL LOW (ref 12.0–15.0)
MCH: 28.6 pg (ref 26.0–34.0)
MCHC: 29.8 g/dL — ABNORMAL LOW (ref 30.0–36.0)
MCV: 96 fL (ref 78.0–100.0)
Platelets: 836 10*3/uL — ABNORMAL HIGH (ref 150–400)
RBC: 2.73 MIL/uL — ABNORMAL LOW (ref 3.87–5.11)
RDW: 17 % — ABNORMAL HIGH (ref 11.5–15.5)
WBC: 9.9 10*3/uL (ref 4.0–10.5)

## 2014-08-16 LAB — BASIC METABOLIC PANEL
Anion gap: 8 (ref 5–15)
BUN: 12 mg/dL (ref 6–23)
CO2: 39 meq/L — AB (ref 19–32)
Calcium: 8.9 mg/dL (ref 8.4–10.5)
Chloride: 85 mEq/L — ABNORMAL LOW (ref 96–112)
Creatinine, Ser: 0.41 mg/dL — ABNORMAL LOW (ref 0.50–1.10)
GFR calc Af Amer: >90 mL/min (ref >90)
GFR calc non Af Amer: >90 mL/min (ref >90)
GLUCOSE: 119 mg/dL — AB (ref 70–99)
Potassium: 3.8 mEq/L (ref 3.7–5.3)
Sodium: 132 mEq/L — ABNORMAL LOW (ref 137–147)

## 2014-08-16 LAB — GLUCOSE, CAPILLARY
Glucose-Capillary: 129 mg/dL — ABNORMAL HIGH (ref 70–99)
Glucose-Capillary: 135 mg/dL — ABNORMAL HIGH (ref 70–99)

## 2014-08-16 MED ORDER — DOCUSATE SODIUM 283 MG RE ENEM
1.0000 | ENEMA | Freq: Every day | RECTAL | Status: DC | PRN
  Administered 2014-08-16: 283 mg via RECTAL
  Filled 2014-08-16: qty 1

## 2014-08-16 MED ORDER — SODIUM CHLORIDE 0.9 % IV BOLUS (SEPSIS)
500.0000 mL | Freq: Once | INTRAVENOUS | Status: AC
  Administered 2014-08-16: 500 mL via INTRAVENOUS

## 2014-08-16 MED ORDER — POTASSIUM CHLORIDE 20 MEQ/15ML (10%) PO LIQD
20.0000 meq | Freq: Once | ORAL | Status: AC
  Administered 2014-08-16: 20 meq
  Filled 2014-08-16: qty 15

## 2014-08-16 MED ORDER — GUAIFENESIN 100 MG/5ML PO SYRP
600.0000 mg | ORAL_SOLUTION | Freq: Four times a day (QID) | ORAL | Status: DC
  Filled 2014-08-16 (×4): qty 30

## 2014-08-16 MED ORDER — CEFAZOLIN SODIUM-DEXTROSE 2-3 GM-% IV SOLR
2.0000 g | INTRAVENOUS | Status: AC
  Administered 2014-08-17: 2 g via INTRAVENOUS
  Filled 2014-08-16: qty 50

## 2014-08-16 MED ORDER — BISACODYL 10 MG RE SUPP: 10.0000 mg | Freq: Every day | RECTAL | Status: DC | PRN

## 2014-08-16 MED ORDER — GUAIFENESIN 100 MG/5ML PO SOLN
600.0000 mg | Freq: Four times a day (QID) | ORAL | Status: DC
  Administered 2014-08-16 – 2014-08-18 (×8): 600 mg via ORAL
  Filled 2014-08-16 (×13): qty 30

## 2014-08-16 MED ORDER — METOCLOPRAMIDE HCL 5 MG/ML IJ SOLN
5.0000 mg | Freq: Three times a day (TID) | INTRAMUSCULAR | Status: AC
  Administered 2014-08-16 – 2014-08-17 (×4): 5 mg via INTRAVENOUS
  Filled 2014-08-16 (×4): qty 1

## 2014-08-16 NOTE — Consult Note (Signed)
Chief Complaint: Chief Complaint  Patient presents with  . Shortness of Breath  ventilator associated dysphagia Slow to wean Malnutrition   Referring Physician(s): PCCM  History of Present Illness: Elizabeth [name] is a 58 y.o. female  Severe COPD Respiratory distress Ventilator assisted Associated dysphagia Malnutrition Long term care PCCM has requested IR consult for percutaneous gastric tube placement Dr Archer Asa has seen and reviewed imaging Feels pt is appropriate candidate for same Pt with mild ileus on 10/19 KUB Will recheck in am   Past Medical History  Diagnosis Date  . COPD (chronic obstructive pulmonary disease)   . Hypertension   . Acute respiratory failure   . GERD (gastroesophageal reflux disease)   . Hiatal hernia   . Hyperlipidemia   . Shortness of breath     Past Surgical History  Procedure Laterality Date  . Cholecystectomy    . Tracheostomy      feinstein    Allergies: Bactrim ds; Codeine; Morphine; and Demerol  Medications: Prior to Admission medications   Medication Sig Start Date End Date Taking? Authorizing Provider  albuterol (PROAIR HFA) 108 (90 BASE) MCG/ACT inhaler Inhale 2 puffs into the lungs every 4 (four) hours as needed for wheezing or shortness of breath.   Yes Historical Provider, MD  albuterol (PROVENTIL) (2.5 MG/3ML) 0.083% nebulizer solution Take 3 mLs (2.5 mg total) by nebulization every 4 (four) hours as needed for shortness of breath. 06/18/14  Yes Bernadene Person, NP  alendronate (FOSAMAX) 70 MG tablet Take 70 mg by mouth every Wednesday. Take with a full glass of water on an empty stomach.   Yes Historical Provider, MD  arformoterol (BROVANA) 15 MCG/2ML NEBU Take 2 mLs (15 mcg total) by nebulization 2 (two) times daily. 06/18/14  Yes Bernadene Person, NP  aspirin 81 MG tablet Take 81 mg by mouth daily.   Yes Historical Provider, MD  budesonide (PULMICORT) 0.5 MG/2ML nebulizer solution Take 2 mLs (0.5 mg  total) by nebulization 2 (two) times daily. 06/18/14  Yes Bernadene Person, NP  feeding supplement, ENSURE COMPLETE, (ENSURE COMPLETE) LIQD Take 237 mLs by mouth 2 (two) times daily between meals. 06/03/14  Yes Kathlen Mody, MD  LORazepam (ATIVAN) 1 MG tablet Take 0.5 tablets (0.5 mg total) by mouth 2 (two) times daily as needed for anxiety. 06/16/14  Yes Bernadene Person, NP  metoprolol tartrate (LOPRESSOR) 25 MG tablet Take 12.5 mg by mouth 2 (two) times daily.    Yes Historical Provider, MD  pantoprazole (PROTONIX) 40 MG tablet Take 1 tablet (40 mg total) by mouth 2 (two) times daily before a meal. 06/27/14  Yes Tammy S Parrett, NP  predniSONE (DELTASONE) 10 MG tablet Take 4 tabs daily for 3 days, 3 tabs daily for 3 days, 2 tabs daily for 3 days, and 1 tab daily until follow up appointment with pulmonologist. 07/05/14  Yes Rich Number, MD  simvastatin (ZOCOR) 20 MG tablet Take 20 mg by mouth daily at 6 PM.   Yes Historical Provider, MD  tiotropium (SPIRIVA) 18 MCG inhalation capsule Place 18 mcg into inhaler and inhale daily.   Yes Historical Provider, MD  valACYclovir (VALTREX) 500 MG tablet Take 500 mg by mouth every morning.    Yes Historical Provider, MD    No family history on file.  History   Social History  . Marital Status: Married    Spouse Name: N/A    Number of Children: N/A  . Years of Education: N/A  Social History Main Topics  . Smoking status: Former Smoker -- 1.00 packs/day for 30 years    Types: Cigarettes    Quit date: 05/30/2004  . Smokeless tobacco: Never Used  . Alcohol Use: No  . Drug Use: No  . Sexual Activity: None   Other Topics Concern  . None   Social History Narrative  . None     Review of Systems: A 12 point ROS discussed and pertinent positives are indicated in the HPI above.  All other systems are negative.  Review of Systems  Constitutional: Positive for activity change. Negative for fever.  Respiratory: Positive for shortness of breath  and wheezing.     Vital Signs: BP 92/73  Pulse 117  Temp(Src) 98 F (36.7 C) (Axillary)  Resp 12  Ht 5\' 5"  (1.651 m)  Wt 76.5 kg (168 lb 10.4 oz)  BMI 28.07 kg/m2  SpO2 100%  Physical Exam  Constitutional: She appears well-developed.  Cardiovascular: Normal rate and regular rhythm.   Pulmonary/Chest: She is in respiratory distress. She has wheezes.  vent  Abdominal: Soft. Bowel sounds are normal. There is no guarding.  Musculoskeletal: Normal range of motion.  Can follow all commands to move all 4s Slowly and only minimal  Psychiatric:  LM on phone for husband Will to consent with him in am    Imaging: Dg Abd 1 View  08/15/2014   CLINICAL DATA:  Abdominal distention.  EXAM: ABDOMEN - 1 VIEW  COMPARISON:  08/13/2014  FINDINGS: Feeding tube tip is in the upper mid abdomen consistent with location in the distal stomach. Scattered gas and stool in the colon. Borderline colonic distention suggest ileus. No small bowel distention. No radiopaque stones. Surgical clips in the right upper quadrant. Visualized bones appear intact.  IMPRESSION: Enteric tube tip localizes to the distal stomach. Gas and stool in the colon suggesting mild ileus. No evidence of obstruction.   Electronically Signed   By: Burman Nieves M.D.   On: 08/15/2014 21:01   Dg Chest Port 1 View  08/16/2014   CLINICAL DATA:  58 year old female with respiratory failure  EXAM: PORTABLE CHEST - 1 VIEW  COMPARISON:  08/11/2014, 08/10/2014 prior chest CT 03/06/2012  FINDINGS: Right rotation of the patient.  Cardiomediastinal silhouette unchanged in size and contour.  Bibasilar opacities with blunting of the costophrenic angles, persisting from comparison. Improved aeration at the bases, although there is interstitial opacity of the right middle lobe persisting. Partial obscuration of the hemidiaphragms. Changes of advanced emphysema. This was better seen on CT of the chest 03/06/2012.  No visualized pneumothorax.  Unchanged  position of the tracheostomy tube, partially visualized. Enteric tube projects over the mediastinum, terminating of the field of view.  Unchanged position of right-sided PICC.  Overlying EKG leads.  IMPRESSION: Improving aeration at the bases, with persisting interstitial opacities. This is nonspecific finding, potentially representing scarring in this patient with advanced emphysema, though could alternatively reflect bronchitis and/ or bronchopneumonia.  Unchanged support apparatus.  No pneumothorax.  Signed,  Yvone Neu. Loreta Ave, DO  [facility]  [facility]   Electronically Signed   By: Gilmer Mor D.O.   On: 08/16/2014 07:38   Dg Chest Port 1 View  08/11/2014   CLINICAL DATA:  Respiratory failure.  COPD.  EXAM: PORTABLE CHEST - 1 VIEW  COMPARISON:  Single view of the chest 08/10/2014 and 08/09/2014.  FINDINGS: Right chest tube seen on yesterday's examination has been removed. No pneumothorax identified. Support tubes  and lines are otherwise unchanged. The lungs are severely emphysematous. No consolidative process, pneumothorax or effusion is identified. Heart size is normal.  IMPRESSION: Right chest tube has been removed.  Negative for pneumothorax.  Severe emphysema.  No acute abnormality.   Electronically Signed   By: Drusilla Kannerhomas  Dalessio M.D.   On: 08/11/2014 07:43   Dg Chest Port 1 View  08/10/2014   CLINICAL DATA:  Tracheostomy. Ventilator dependence. Respiratory failure.  EXAM: PORTABLE CHEST - 1 VIEW  COMPARISON:  08/09/2014  FINDINGS: Tracheostomy tube projects over the tracheal air shadow. Feeding tube extends into the stomach. Right-sided PICC line tip: SVC.  Severe emphysema. Improvement in prior abnormal interstitial accentuation, although not resolved. The right chest tube appears to of back CT into the soft tissues of the right chest wall, with no portion of the tube within the thoracic cage. Subtle airspace opacity in the right mid lung. I do not  observe a pneumothorax.  Left basilar bulla versus unusual projection of gas in bowel below the left hemidiaphragm.  IMPRESSION: 1. The right chest tube has backed out into the soft tissues of the right chest wall, with no portion of the tube within the thoracic cage. No visible pneumothorax. 2. Improved interstitial prominence. Faint residual airspace opacity in the right mid lung. 3. Severe emphysema.  These results will be called to the ordering clinician or representative by the Radiologist Assistant, and communication documented in the PACS or zVision Dashboard.   Electronically Signed   By: Herbie BaltimoreWalt  Liebkemann M.D.   On: 08/10/2014 07:52   Dg Chest Port 1 View  08/09/2014   CLINICAL DATA:  Respiratory failure.  EXAM: PORTABLE CHEST - 1 VIEW  COMPARISON:  10/06/2014 and 10/07/2014  FINDINGS: Tracheostomy tube, feeding tube and PICC appear in good position. Right chest tube is in place. No pneumothorax.  The lungs are hyperinflated consistent with emphysema. There is new pulmonary vascular congestion and interstitial accentuation at the right lung base which may represent focal pulmonary edema. No other change.  IMPRESSION: Increasing pulmonary vascular congestion at the right lung base which may represent focal pulmonary edema superimposed on severe COPD. No pneumothorax.   Electronically Signed   By: Geanie CooleyJim  Maxwell M.D.   On: 08/09/2014 07:02   Dg Chest Port 1 View  08/08/2014   CLINICAL DATA:  Hand the placement.  Pneumothorax.  EXAM: PORTABLE CHEST - 1 VIEW  COMPARISON:  08/07/2014  FINDINGS: There is a feeding tube which enters the stomach. The tip is not visualized. Right upper extremity PICC tip is stable at the upper cavoatrial junction. Tracheostomy tube, and right sided chest tube are in similar position.  Severe emphysema. A right-sided pneumothorax is not visualized currently. Basilar lung opacification, right more than left, stable from previous. No effusion. Normal heart size.  IMPRESSION: 1.  Stable positioning of tubes and central line. 2. A right pneumothorax is no longer visible.   Electronically Signed   By: Tiburcio PeaJonathan  Watts M.D.   On: 08/08/2014 00:40   Dg Chest Port 1 View  08/07/2014   CLINICAL DATA:  Hypoxia  EXAM: PORTABLE CHEST - 1 VIEW  COMPARISON:  August 06, 2014  FINDINGS: Tracheostomy catheter tip is 9.4 cm above the carina. Feeding tube tip extends below the diaphragm. There is a chest tube on the right, unchanged in position. Central catheter tip in superior vena cava. Small right apical pneumothorax remains. Subcutaneous air is also noted on the right and left sides. There is underlying emphysema. There  is patchy atelectatic change in the bases. Elsewhere lungs are clear. The heart size and pulmonary vascularity are normal. No adenopathy.  IMPRESSION: Tube and catheter positions as described. Stable small right apical pneumothorax without tension component. Subcutaneous air bilaterally noted. Bibasilar atelectatic change. Underlying emphysema.   Electronically Signed   By: Bretta Bang M.D.   On: 08/07/2014 12:40   Dg Chest Port 1 View  08/06/2014   CLINICAL DATA:  Chest tube placement.  EXAM: PORTABLE CHEST - 1 VIEW  COMPARISON:  August 06, 2014 11:30 a.m.  FINDINGS: The heart size and mediastinal contours are stable. There is interval placement of a right chest tube with distal tip near the right hilum. The previously noted right pneumothorax is significantly decreased with question pleural line in the right apex. There is right subcutaneous emphysema. The left lung is clear. Tracheostomy tube, enteric feeding tube, right central venous line are unchanged.  IMPRESSION: Interval placement of a right chest tube with distal tip near the right hilum. The previously noted right pneumothorax is significantly decreased with question pleural line in the right apex.   Electronically Signed   By: Sherian Rein M.D.   On: 08/06/2014 17:58   Dg Chest Port 1 View  08/06/2014    CLINICAL DATA:  Subsequent encounter for COPD and right-sided pneumothorax.  EXAM: PORTABLE CHEST - 1 VIEW  COMPARISON:  One-view chest 08/04/2014  FINDINGS: The right-sided chest tube is now outside the pleural space completely. A small pneumothorax of tender 20% is slightly improved from the prior study. The tracheostomy tube and feeding tube are stable. Bibasilar airspace disease is again noted. Changes of COPD are noted.  IMPRESSION: 1. Decrease in size of small pneumothorax accounting for 10-20% of the right hemi thorax volume. 2. The right-sided chest tube has been pulled back and is now completely outside the pleural space. 3. Chronic changes of COPD. These results will be called to the ordering clinician or representative by the Radiologist Assistant, and communication documented in the PACS or zVision Dashboard.   Electronically Signed   By: Gennette Pac M.D.   On: 08/06/2014 11:49   Dg Chest Port 1 View  08/04/2014   CLINICAL DATA:  Pneumothorax.  EXAM: PORTABLE CHEST - 1 VIEW  COMPARISON:  August 04, 2014.  FINDINGS: The heart size and mediastinal contours are within normal limits. Mild right apical and basilar pneumothorax is again noted and unchanged. Right-sided chest tube has been partially withdrawn. No significant pleural effusion is noted. Tracheostomy is in grossly good position. Right-sided PICC line is unchanged in position with distal tip overlying expected position of the SVC. Left-sided chest tube has been removed and no definite pneumothorax is noted. The visualized skeletal structures are unremarkable.  IMPRESSION: Stable mild right apical and basilar pneumothorax. Right-sided chest tube has been partially withdrawn, although the distal tip remains within the thoracic space. No definite pneumothorax is noted on the left status post chest tube removal.   Electronically Signed   By: Roque Lias M.D.   On: 08/04/2014 15:57   Dg Chest Port 1 View  08/04/2014   CLINICAL DATA:  Bilateral  pneumothorax follow-up  EXAM: PORTABLE CHEST - 1 VIEW  COMPARISON:  08/03/2014  FINDINGS: Tracheostomy tube, NG tube, and bilateral chest tubes stable. Tiny left and small right pneumothorax unchanged. Hyperinflation consistent with COPD. Minimal infiltrate left base with mild hazy infiltrate right lower lobe possibly due to atelectasis related to the pneumothorax. Bilateral mild subcutaneous emphysema.  IMPRESSION: Bilateral  pneumothorax, unchanged.   Electronically Signed   By: Esperanza Heir M.D.   On: 08/04/2014 07:23   Dg Chest Port 1 View  08/03/2014   CLINICAL DATA:  Subsequent encounter for bilateral pneumothoraces.  EXAM: PORTABLE CHEST - 1 VIEW  COMPARISON:  One-view chest from the same day at 11:35 a.m.  FINDINGS: Bilateral chest tubes are in place. A small residual right apical pneumothorax is stable. A tracheostomy tube is in place. The feeding tube courses off the inferior border of the film. A left IJ catheter is been removed. A new right-sided PICC line is in place. This terminates in the distal SVC, above the cavoatrial junction. Asymmetric right lower lobe airspace disease is present. This is likely exaggerated with some collapse.  IMPRESSION: 1. Persistent bilateral scratch the persistent right pneumothorax with a chest tube in place. 2. Interval placement of right-sided PICC line and removal of left IJ catheter. The tip of the PICC line is in the distal SVC. 3. Right lower lobe airspace disease. This is likely exaggerated by partial collapse of the right lower lobe associated with the pneumothorax. This is concerning for pneumonia.   Electronically Signed   By: Gennette Pac M.D.   On: 08/03/2014 20:34   Dg Chest Port 1 View  08/03/2014   CLINICAL DATA:  Rest respiratory distress ; acute respiratory failure; history of end-stage COPD.  EXAM: PORTABLE CHEST - 1 VIEW  COMPARISON:  Portable chest x-ray of August 03, 2014 at 4:36 a.m.  FINDINGS: The lungs are hyperinflated. A right-sided  pneumothorax is visible and is larger than on the earlier study. No left-sided pneumothorax is visible. The cardiac silhouette is normal in size. The pulmonary vascularity is not engorged. There are increased lung markings at the bases especially on the right. A small amount of pleural fluid on the right is suspected.  Bilateral upper lobe chest tubes are present and stable. The tracheostomy appliance tip lies at approximately the level of the inferior margin of the clavicular heads and is stable. The left internal jugular venous catheter tip overlies the proximal portion of the SVC. The feeding tube tip projects off the inferior margin of the image.  IMPRESSION: 1. COPD. There has been interval increase in the size of the right pneumothorax. It amounts to approximately 20% of the overall right lung volume. There is no mediastinal shift. 2. Persistent bibasilar atelectasis or pneumonia is present. A small right pleural effusion is present. 3. The support tubes and lines are unchanged in position. 4. Critical Value/emergent results were called by telephone at the time of interpretation on 08/03/2014 at 12:06 pm to Leitha Schuller, RN,, who verbally acknowledged these results. She and the patient's position are aware of the findings.   Electronically Signed   By: David  [place]   On: 08/03/2014 12:09   Dg Chest Port 1 View  08/03/2014   CLINICAL DATA:  Followup pneumothorax, shortness of breath, personal history of COPD, hypertension, GERD, hyperlipidemia, former smoker  EXAM: PORTABLE CHEST - 1 VIEW  COMPARISON:  Portable exam 0436 hr compared to 08/02/2014  FINDINGS: Tracheostomy tube, feeding tube, and LEFT jugular line unchanged.  BILATERAL thoracostomy tubes in the upper hemithoraces, unchanged.  Normal heart size, mediastinal contours, and pulmonary vascularity.  Emphysematous and bronchitic changes consistent with COPD.  RIGHT lower lobe infiltrate question pneumonia, new.  Atelectasis versus infiltrate developing  at LEFT base.  Small RIGHT basilar pneumothorax identified.  No definite LEFT pneumothorax seen.  Bones demineralized.  IMPRESSION: COPD  changes with small RIGHT basilar pneumothorax.  New RIGHT lobe infiltrate with minimal atelectasis or developing infiltrate at LEFT lower lobe.   Electronically Signed   By: Ulyses Southward M.D.   On: 08/03/2014 07:55   Dg Chest Port 1 View  08/02/2014   CLINICAL DATA:  Shortness of breath, hypotension  EXAM: PORTABLE CHEST - 1 VIEW  COMPARISON:  08/02/2014  FINDINGS: Cardiomediastinal silhouette is stable. Bilateral chest tubes are unchanged in position. Extensive subcutaneous emphysema chest wall again noted. No acute infiltrate or pulmonary edema. Stable small bilateral apical pneumothorax. Tracheostomy tube is unchanged in position. Stable NG tube position. Stable left IJ central line position.  IMPRESSION: Stable support apparatus. Bilateral chest tubes are unchanged in position. Again noted bilateral small apical pneumothorax.   Electronically Signed   By: Natasha Mead M.D.   On: 08/02/2014 17:31   Dg Chest Port 1 View  08/02/2014   CLINICAL DATA:  Evaluate pneumothorax.  Shortness of breath.  EXAM: PORTABLE CHEST - 1 VIEW  COMPARISON:  Chest radiograph 08/01/2014  FINDINGS: Tracheostomy tube terminates within the mid trachea. Enteric tube courses inferior to the diaphragm, tip not included on this evaluation. Left internal jugular approach central venous catheter tip projects over the superior vena cava. Subcutaneous emphysema.  Stable cardiac and mediastinal contours. Persistent bilateral coarse interstitial pulmonary opacities. Bilateral chest tubes are visualized. There is suggestion of kinking of the mid aspect of the left chest tube in the region of the lateral chest wall soft tissues. Residual tiny biapical pneumothoraces, grossly similar given differences in patient positioning.  IMPRESSION: Grossly similar biapical pneumothoraces given differences in patient  positioning.  Bilateral chest tubes remain in place. There is suggestion of kinking of the left chest tube along the mid aspect of the left lateral chest wall.  Extensive subcutaneous emphysema.  These results will be called to the ordering clinician or representative by the Radiologist Assistant, and communication documented in the PACS or zVision Dashboard.   Electronically Signed   By: Annia Belt M.D.   On: 08/02/2014 13:10   Dg Chest Port 1 View  08/01/2014   CLINICAL DATA:  Recent pneumothorax; chronic obstructive pulmonary disease with difficulty breathing  EXAM: PORTABLE CHEST - 1 VIEW  COMPARISON:  July 31, 2014.  FINDINGS: Tracheostomy tube tip is 8.5 cm above the carina. Central catheter tip is in the superior vena cava. There are chest tubes bilaterally. Feeding tube tip is below the diaphragm. There is a minimal residual pneumothorax on the right, smaller than 1 day prior. There is a minimal apical pneumothorax on the left as well which is felt to be unchanged compared to 1 day prior. There is extensive subcutaneous emphysema bilaterally.  There is underlying emphysema. There is mild bibasilar atelectatic change. Elsewhere lungs are clear. Heart size and pulmonary vascularity are normal. No adenopathy.  IMPRESSION: Chest tubes bilaterally with minimal pneumothoraces bilaterally. Extensive subcutaneous air remains. Mild bibasilar atelectasis. Underlying emphysema. No change in cardiac silhouette. Tube and catheter positions as described.   Electronically Signed   By: Bretta Bang M.D.   On: 08/01/2014 07:37   Dg Chest Port 1 View  07/31/2014   CLINICAL DATA:  Evaluate pneumothorax. COPD. Hypertension. Subsequent encounter.  EXAM: PORTABLE CHEST - 1 VIEW  COMPARISON:  07/30/2014; 07/29/2014; 07/28/2014  FINDINGS: Grossly unchanged cardiac silhouette and mediastinal contours. Stable positioning of support apparatus. Interval development of a trace right-sided pneumothorax. No definite  left-sided pneumothorax though evaluation again degraded secondary to large amount  of subcutaneous emphysema within the chest wall, which appears grossly unchanged. Linear heterogeneous opacities within the left mid lung are unchanged. No new focal airspace opacities. No pleural effusion. No definite evidence of edema. Unchanged bones.  IMPRESSION: 1. Stable positioning of support apparatus with development of a tiny right-sided pneumothorax. No definite left-sided pneumothorax. 2. Grossly unchanged heterogeneous opacities within the peripheral aspect of the left mid lung. No new focal airspace opacities.   Electronically Signed   By: Simonne Come M.D.   On: 07/31/2014 08:26   Dg Chest Port 1 View  07/30/2014   CLINICAL DATA:  Pneumothorax, subsequent encounter  EXAM: PORTABLE CHEST - 1 VIEW  COMPARISON:  07/29/2014; 07/28/2014; 07/27/2014; 07/26/2014  FINDINGS: Grossly unchanged cardiac silhouette and mediastinal contours. Stable position of support apparatus. Apparent resolution of tiny right basilar pneumothorax. No definite pneumothorax though again, evaluation is degraded secondary to extensive overlying chest wall subcutaneous emphysema which appears grossly unchanged. Improved aeration of lungs with persistent bilateral infrahilar opacities, left greater than right. No new focal airspace opacities. No definite pleural effusion, though note, the bilateral costophrenic angles are excluded from view. No definite evidence of edema. Unchanged bones.  IMPRESSION: 1. Stable positioning of support apparatus. No definite pneumothorax with apparent resolution of previously noted tiny right basilar pneumothorax. 2. The amount of extensive bilateral chest wall subcutaneous emphysema is grossly unchanged. 3. Improved aeration of the lungs with persistent infrahilar opacities, likely atelectasis.   Electronically Signed   By: Simonne Come M.D.   On: 07/30/2014 08:04   Chest Portable 1 View To Assess Tube Placement And  Rule-out Pneumothorax  07/29/2014   CLINICAL DATA:  Tracheostomy placement.  Evaluate pneumothorax.  EXAM: PORTABLE CHEST - 1 VIEW  COMPARISON:  07/29/2014.  FINDINGS: Support apparatus: Replacement of endotracheal tube with tracheostomy. Other support apparatus is unchanged with bilateral thoracostomy tubes. Enteric tube and LEFT IJ central line also unchanged.  Cardiomediastinal Silhouette:  Unchanged.  Lungs: Basilar atelectasis and airspace opacity. Small RIGHT basilar pneumothorax is unchanged compared to prior.  Effusions:  None.  Other:  Monitoring leads project over the chest.  IMPRESSION: 1. Uncomplicated interval tracheostomy placement. 2. Other support apparatus stable. 3. Unchanged RIGHT basilar pneumothorax and bibasilar airspace disease and atelectasis.   Electronically Signed   By: Andreas Newport M.D.   On: 07/29/2014 12:52   Dg Chest Port 1 View  07/29/2014   CLINICAL DATA:  COPD, acute respiratory failure with shortness of breath, check endotracheal tube placement  EXAM: PORTABLE CHEST - 1 VIEW  COMPARISON:  07/28/2014  FINDINGS: Cardiac shadow is stable. An endotracheal tube is noted 4.5 cm above the carina. A left central venous line is noted with the catheter tip in the proximal superior vena cava. Nasogastric catheter is again noted coursing into the stomach.  Bilateral thoracostomy catheters are again identified. No pneumothorax is seen. No focal infiltrate is seen. Diffuse subcutaneous emphysema is noted.  IMPRESSION: No significant interval change from the prior exam. No new focal abnormality is noted.   Electronically Signed   By: Alcide Clever M.D.   On: 07/29/2014 07:41   Dg Chest Port 1 View  07/28/2014   CLINICAL DATA:  58 year old female with a history emphysema, now intubated with bilateral pneumothorax.  EXAM: PORTABLE CHEST - 1 VIEW  COMPARISON:  Multiple prior plain film, most recent 07/28/2014 4:18 p.m.  FINDINGS: Cardiomediastinal silhouette within normal limits in size and  contour.  Unchanged position of endotracheal tube which terminates approximately 5 cm  above the carina.  Unchanged position of bilateral thoracostomy tubes terminating at the apex. Unchanged left IJ central venous catheter, which appears to terminate in the superior vena cava. Unchanged gastric tube terminating above the field of view.  Extensive chest wall myofacial emphysema/subcutaneous emphysema.  Portions of the right base have been excluded, with the previous sub pulmonic pneumothorax component no longer visible.  No visualized left-sided pneumothorax.  Extensive interstitial and mixed airspace opacities which are unchanged from the comparison.  IMPRESSION: The previously visualized sub pulmonic right-sided pneumothorax is no longer visualized, however, portions of the right base have been excluded from the exam.  Unchanged support apparatus.  Extensive myofacial and subcutaneous chest emphysema.  Signed,  Yvone NeuJaime S. Loreta AveWagner, DO  [facility]  [facility]   Electronically Signed   By: Gilmer MorJaime  Wagner O.D.   On: 07/28/2014 21:38   Dg Chest Port 1 View  07/28/2014   CLINICAL DATA:  58 year old female post bronchoscopy.  EXAM: PORTABLE CHEST - 1 VIEW  COMPARISON:  Multiple prior plain film.  Most recent 07/28/2014.  FINDINGS: Cardiomediastinal silhouette is unchanged in size and contour.  Compared to the prior there are now on defibrillator pads on the chest wall.  Unchanged position of bilateral thoracostomy tube, on the right terminating at the apex, and on the left terminating medially at the apex.  Endotracheal tube terminates approximately 4- 5 cm above the carina, unchanged.  Unchanged left IJ central venous catheter. Unchanged position of gastric tube.  Extensive subcutaneous emphysema on the bilateral thoracic wall again noted.  Persistent right-sided pneumothorax, with appreciable subpulmonic component.  No visualized left pneumothorax.  Mixed interstitial and  airspace disease of the bilateral lungs, similar to the comparison.  IMPRESSION: Persisting right-sided pneumothorax, with unchanged position of bilateral thoracostomy tubes.  Extensive subcutaneous and myofacial emphysema of the thorax.  Unchanged position of endotracheal tube, left IJ central catheter, and gastric tube.  Interval placement of defibrillator pads.  These results were called by telephone at the time of interpretation on 07/28/2014 at 4:57 pm to Dr. Micah Flesheran Feinstein, who verbally acknowledged these results.  Signed,  Yvone NeuJaime S. Loreta AveWagner, DO  [facility]  [facility]   Electronically Signed   By: Gilmer MorJaime  Wagner O.D.   On: 07/28/2014 16:58   Dg Chest Port 1 View  07/28/2014   CLINICAL DATA:  Post could.  EXAM: PORTABLE CHEST - 1 VIEW  COMPARISON:  07/27/2014  FINDINGS: Endotracheal tube tip measures 5.4 cm above the carina. Bilateral chest tubes are present. Left central venous catheter with tip over the upper SVC region. Enteric tube tip is off the field of view but below the left hemidiaphragm. Extensive subcutaneous emphysema throughout the chest, neck, and visualized extremities. No definitive pneumothorax. No change since previous study.  IMPRESSION: Appliances appear in satisfactory location. Persistent extensive subcutaneous emphysema.   Electronically Signed   By: Burman NievesWilliam  Stevens M.D.   On: 07/28/2014 03:47   Dg Chest Port 1 View  07/27/2014   CLINICAL DATA:  Hypoxia  EXAM: PORTABLE CHEST - 1 VIEW  COMPARISON:  July 26, 2014  FINDINGS: Endotracheal tube tip is 4.2 cm above the carina. Central catheter tip is at the junction of the left innominate vein and superior vena cava. Nasogastric tube tip and side port are below the diaphragm. There are bilateral chest tubes. No pneumothorax is appreciable on this study. There is widespread subcutaneous emphysema, grossly unchanged. There is underlying emphysema. There is patchy atelectatic  change in the  lower lung zones bilaterally. There is no consolidation. The heart size and pulmonary vascularity are normal. No adenopathy.  IMPRESSION: Tube and catheter positions as described. Widespread subcutaneous emphysema remains. No pneumothorax is demonstrable. There is underlying emphysema with bilateral lower lobe atelectatic change. No new opacity. No change in cardiac silhouette.   Electronically Signed   By: Bretta Bang M.D.   On: 07/27/2014 07:56   Dg Chest Port 1 View  07/26/2014   CLINICAL DATA:  Intubated.  Shortness of breath.  EXAM: PORTABLE CHEST - 1 VIEW  COMPARISON:  07/25/2014  FINDINGS: Endotracheal tube tip measures 6 cm above the carina. Enteric tube tip is off the field of view but below the left hemidiaphragm consistent with location at least in the stomach. Bilateral chest tubes unchanged in position. Diffuse subcutaneous emphysema without change. No definite residual pneumothorax. Normal heart size and pulmonary vascularity. Emphysematous changes in the lungs with atelectasis in the lung bases. No focal consolidation in the lungs.  IMPRESSION: No significant change since previous study. Appliances appear unchanged in position. Extensive subcutaneous emphysema. No focal consolidation in the lungs.   Electronically Signed   By: Burman Nieves M.D.   On: 07/26/2014 06:07   Dg Chest Port 1 View  07/25/2014   CLINICAL DATA:  Bilateral chest tubes.  EXAM: PORTABLE CHEST - 1 VIEW  COMPARISON:  07/24/2014  FINDINGS: Bilateral chest tubes again project over the upper lobes. Severe diffuse subcutaneous emphysema noted. No visible pneumothorax although a small pneumothoraces could be obscured by the subcutaneous emphysema. COPD changes within the lungs with hyperinflation. Heart is normal size. No definite confluent opacity or effusion.  IMPRESSION: Bilateral chest tubes without visible pneumothorax. Severe diffuse subcutaneous emphysema persists.  COPD.   Electronically Signed   By: Charlett Nose  M.D.   On: 07/25/2014 07:23   Dg Chest Port 1 View  07/24/2014   CLINICAL DATA:  Evaluate endotracheal tube, history COPD, hypertension  EXAM: PORTABLE CHEST - 1 VIEW  COMPARISON:  Portable exam 0601 hr compared 07/23/2014  FINDINGS: Tip of endotracheal tube projects 6.0 cm above carina.  Nasogastric tube extends into into stomach.  BILATERAL thoracostomy tubes at upper lobes.  LEFT jugular central venous catheter tip projects over proximal SVC.  Normal heart size and mediastinal contours.  Severe emphysematous changes with minimal atelectasis at bases.  Significant superimposed chest wall emphysema extending into the cervical regions.  No definite pulmonary infiltrate or pleural effusion.  No gross pneumothorax identified.  IMPRESSION: Extensive subcutaneous and chest wall emphysema.  Severe COPD changes without acute infiltrate.   Electronically Signed   By: Ulyses Southward M.D.   On: 07/24/2014 08:10   Dg Chest Port 1 View  07/23/2014   CLINICAL DATA:  Check endotracheal tube and chest tubes.  EXAM: PORTABLE CHEST - 1 VIEW  COMPARISON:  07/22/2014  FINDINGS: Endotracheal tube tip measures 4.7 cm above the carinal. Enteric tube is not visualized and may have been removed. Left central venous catheter tip is over the left upper mediastinum, overlying the junction of the brachiocephalic vein and SVC. Bilateral chest tubes are unchanged in position. Extensive subcutaneous emphysema is again demonstrated. Increasing atelectasis or infiltration is seen in the right lung base. Probable small residual pneumothoraces in the bases of both lungs. Pneumomediastinum is less prominent. No blunting of costophrenic angles.  IMPRESSION: Enteric tube appears to have been removed. Appliances are otherwise unchanged in position. Extensive subcutaneous emphysema is again demonstrated with probable small  basilar pneumothoraces bilaterally. Developing atelectasis or infiltration in the right lung base. Pneumomediastinum is less  pronounced.   Electronically Signed   By: Burman Nieves M.D.   On: 07/23/2014 06:13   Dg Chest Port 1 View  07/22/2014   CLINICAL DATA:  Endotracheal tube  EXAM: PORTABLE CHEST - 1 VIEW  COMPARISON:  Chest x-ray from yesterday  FINDINGS: Endotracheal tube tip ends between the carina and clavicular heads. Orogastric tube crosses the diaphragm. Left IJ catheter tip appears slightly retracted, tip in the region of the SVC origin or distal left brachiocephalic vein. Bilateral chest tubes have a similar orientation.  Subcutaneous gas appears diffusely increased. There is a tiny right apical pneumothorax. There is pneumomediastinum which appears increased or new. No new parenchymal lung opacities or edema.  Normal heart size and upper mediastinal contours.  IMPRESSION: 1. Increased subcutaneous gas and pneumomediastinum since yesterday. 2. Tiny right pneumothorax.  Chest tubes remain in stable position.   Electronically Signed   By: Tiburcio Pea M.D.   On: 07/22/2014 05:49   Dg Chest Port 1 View  07/21/2014   CLINICAL DATA:  Shortness of breath  EXAM: PORTABLE CHEST - 1 VIEW  COMPARISON:  07/20/2014  FINDINGS: Endotracheal tube stable at 4.7 cm above the carina. A left jugular line is noted in the proximal superior vena cava. Nasogastric catheter is noted passing into the stomach. Bilateral chest tubes are again identified and stable. Diffuse subcutaneous emphysema is noted. No residual pneumothorax is noted.  IMPRESSION: Tubes and lines as described.  No residual pneumothorax.  Diffuse subcutaneous emphysema.   Electronically Signed   By: Alcide Clever M.D.   On: 07/21/2014 07:10   Dg Chest Port 1 View  07/20/2014   CLINICAL DATA:  Status post left central line placement  EXAM: PORTABLE CHEST - 1 VIEW  COMPARISON:  Portable chest x-ray of 7:33 a.m. today.  FINDINGS: There is extensive subcutaneous emphysema. There are bilateral chest tubes in place. No pneumothorax is demonstrated currently. The cardiac  silhouette is normal in size. The pulmonary vascularity is not engorged. There is bibasilar atelectasis. The left internal jugular venous tap catheter tip overlies the junction of the right and left brachiocephalic veins. The endotracheal tube tip lies approximately 4.5 cm above the crotch of the carina. The left-sided chest tube tip lies medially overlying the T4 vertebral body. The right chest tube tip lies in the pulmonary apex. The esophagogastric tube tip projects below the inferior margin of the image.  IMPRESSION: There is no postprocedure complication following placement of the left internal jugular venous catheter. Extensive subcutaneous emphysema persists. There is no visible pneumothorax. There is bibasilar subsegmental atelectasis.   Electronically Signed   By: David  [place]   On: 07/20/2014 12:29   Dg Chest Portable 1 View  07/20/2014   CLINICAL DATA:  Chest tube insertion for pneumothorax  EXAM: PORTABLE CHEST - 1 VIEW  COMPARISON:  Study obtained earlier in the day  FINDINGS: There is a new chest tube on the right with the tip at the right apex. There has been interval resolution of a pneumothorax on the right. No pneumothorax is currently appreciable.  Endotracheal tube tip is 4.1 cm above the carina. There is a chest tube on the left. Nasogastric tube tip and side port are below the diaphragm.  Widespread subcutaneous emphysema is again noted. There is mild interstitial edema in the lungs. No consolidation. Heart size and pulmonary vascularity are normal. No adenopathy.  IMPRESSION: Interval resolution  of pneumothorax the right following chest tube placement. There is mild interstitial edema in the lungs, stable. No new opacity. Extensive subcutaneous emphysema remains.   Electronically Signed   By: Bretta Bang M.D.   On: 07/20/2014 07:48   Dg Chest Portable 1 View  07/20/2014   CLINICAL DATA:  Shortness of Breath  EXAM: PORTABLE CHEST - 1 VIEW  COMPARISON:  07/20/2014  FINDINGS:  Cardiomediastinal silhouette is stable. There is a left chest tube. Endotracheal tube and NG tube in place. Bilateral chest wall subcutaneous emphysema. There is about 40% new right pneumothorax and partially collapsed right lung.  IMPRESSION: Left chest tube in place. No left pneumothorax. Bilateral chest wall subcutaneous emphysema. New about 40% right pneumothorax. Critical Value/emergent results were called by telephone at the time of interpretation on 07/20/2014 at 7:19 am to Dr. Loren Racer , who verbally acknowledged these results.   Electronically Signed   By: Natasha Mead M.D.   On: 07/20/2014 07:19   Dg Chest Portable 1 View  07/20/2014   CLINICAL DATA:  Shortness of breath  EXAM: PORTABLE CHEST - 1 VIEW  COMPARISON:  Prior radiograph from 07/02/2014  FINDINGS: The cardiac and mediastinal silhouettes are stable in size and contour, and remain within normal limits.  The lungs are hyperinflated with emphysematous changes present. No airspace consolidation or pulmonary edema. Minimal chronic blunting of the bilateral costophrenic angles is stable. No pleural effusion or pneumothorax.  No acute osseous abnormality identified.  IMPRESSION: Emphysema.  No active cardiopulmonary disease.   Electronically Signed   By: Rise Mu M.D.   On: 07/20/2014 06:33   Dg Chest Portable 1 View  07/20/2014   CLINICAL DATA:  Shortness of breath  EXAM: PORTABLE CHEST - 1 VIEW  COMPARISON:  Chest x-ray from the same day at 5:29.  FINDINGS: Tracheal intubation, tip between the clavicular heads and carina. An orogastric tube at least reaches the diaphragm.  Normal heart size and mediastinal contours.  Severe pulmonary hyperinflation, with nonvisualization of the left base. Bullous emphysema, especially the left base. No superimposed edema, effusion, pneumonia, or pneumothorax.  IMPRESSION: 1. New endotracheal and orogastric tubes are in good position. 2. Severe emphysema.  No acute superimposed findings.    Electronically Signed   By: Tiburcio Pea M.D.   On: 07/20/2014 06:34   Dg Chest Portable 1 View  07/20/2014   CLINICAL DATA:  Assess pneumothorax.  EXAM: PORTABLE CHEST - 1 VIEW  COMPARISON:  Chest x-ray from earlier the same day at 5:46 a.m.  FINDINGS: Endotracheal tube ends between the clavicular heads and carina. A gastric suction tube likely reaches the stomach, although is partially imaged.  Normal heart size.  Negative aortic contours.  Severe pulmonary hyperinflation with bullous change. No edema, pneumonia, effusion, or visible pneumothorax.  IMPRESSION: 1. Endotracheal tube in good position. 2. Orogastric tube side port may be at the GE junction. Consider abdominal imaging. 3. Bullous emphysema.  No acute superimposed findings.   Electronically Signed   By: Tiburcio Pea M.D.   On: 07/20/2014 06:23   Dg Chest Port 1v Same Day  08/09/2014   CLINICAL DATA:  Pneumothorax.  Chest tube removal  EXAM: PORTABLE CHEST - 1 VIEW SAME DAY  COMPARISON:  08/09/2014  FINDINGS: Right chest tube remains in stable position. No pneumothorax. Tracheostomy tube, PICC line remain in place, unchanged. Underlying COPD. Heart is normal size. Stable interstitial prominence and bibasilar opacities, right greater than left.  IMPRESSION: No significant change since prior  study. Right chest tube remains in place. No visible pneumothorax.   Electronically Signed   By: Charlett Nose M.D.   On: 08/09/2014 13:26   Dg Abd Portable 1v  08/14/2014   CLINICAL DATA:  Feeding tube.  Subsequent encounter  EXAM: PORTABLE ABDOMEN - 1 VIEW  COMPARISON:  07/29/2014  FINDINGS: There is a feeding tube which is coiled in the fundus with tip overlapping the proximal stomach.  The visualized bowel gas pattern is nonobstructive.  Depressed diaphragm in the setting of pulmonary hyperinflation.  Cholecystectomy changes.  IMPRESSION: Feeding tube tip at the proximal stomach.   Electronically Signed   By: Tiburcio Pea M.D.   On: 08/14/2014  01:34   Dg Abd Portable 1v  07/29/2014   CLINICAL DATA:  Pain into placement  EXAM: PORTABLE ABDOMEN - 1 VIEW  COMPARISON:  Abdominal film 07/23/2014  FINDINGS: Feeding tube with weighted tip just distal to the GE junction within the gastric cardiac region. No evidence of bowel obstruction.  IMPRESSION: Feeding tube with tip in the stomach just past the GE junction.   Electronically Signed   By: Genevive Bi M.D.   On: 07/29/2014 12:41   Dg Abd Portable 1v  07/23/2014   CLINICAL DATA:  OG tube placement  EXAM: PORTABLE ABDOMEN - 1 VIEW  COMPARISON:  None.  FINDINGS: OG projecting over the stomach. There is no bowel dilatation to suggest obstruction. Soft tissue emphysema is noted. There are no pathologic calcifications along the expected course of the ureters.The osseous structures are unremarkable.  IMPRESSION: OG projecting over the stomach.  Soft tissue emphysema is noted.   Electronically Signed   By: Elige Ko   On: 07/23/2014 21:48   Dg Abd Portable 1v  07/23/2014   CLINICAL DATA:  OG tube placement.  EXAM: PORTABLE ABDOMEN - 1 VIEW  COMPARISON:  Chest x-ray 07/23/2014  FINDINGS: Enteric tube is present with side port in the region of the gastroesophageal junction and tip not well-defined but likely over the stomach in the midline upper abdomen. Bowel gas pattern is notable for moderate fecal retention throughout the colon. There is a somewhat diffuse mottled appearance of lucency over the abdomen likely represent patient's known subcutaneous air. There are surgical clips over the right upper quadrant. There is a small caliber catheter projected over the midline pelvis likely within the rectum. Remainder the exam is unremarkable.  IMPRESSION: Mild moderate fecal retention without evidence of obstruction.  Enteric tube with tip in the region of the stomach in the upper mid abdomen and side-port over the region of the gastroesophageal junction.  Subcutaneous air.   Electronically Signed   By:  Elberta Fortis M.D.   On: 07/23/2014 09:59    Labs:  CBC:  Recent Labs  08/08/14 0646 08/10/14 0655 08/13/14 0520 08/16/14 0340  WBC 9.6 9.4 6.9 9.9  HGB 7.8* 7.9* 7.6* 7.8*  HCT 25.1* 26.4* 25.9* 26.2*  PLT 284 332 562* 836*    COAGS:  Recent Labs  05/28/14 1520 07/28/14 1200  INR 1.04 1.10  APTT 21* 26    BMP:  Recent Labs  08/08/14 0646 08/10/14 0655 08/13/14 0520 08/16/14 0340  NA 136* 139 137 132*  K 3.9 4.2 4.1 3.8  CL 91* 93* 94* 85*  CO2 39* 37* 38* 39*  GLUCOSE 111* 85 113* 119*  BUN 9 14 11 12   CALCIUM 8.2* 8.6 8.7 8.9  CREATININE 0.47* 0.62 0.37* 0.41*  GFRNONAA >90 >90 >90 >90  GFRAA >  90 >90 >90 >90    LIVER FUNCTION TESTS:  Recent Labs  07/24/14 0412 07/25/14 0400 07/29/14 0430 08/05/14 0420  BILITOT 0.8 0.8 1.0 0.9  AST 17 16 65* 40*  ALT 29 27 206* 89*  ALKPHOS 37* 37* 78 90  PROT 4.9* 5.0* 5.2* 5.1*  ALBUMIN 2.5* 2.5* 2.7* 2.4*    TUMOR MARKERS: No results found for this basename: AFPTM, CEA, CA199, CHROMGRNA,  in the last 8760 hours  Assessment and Plan:  Severe COPD Vent now Slow to wean Dysphagia assoc with vent; malnutrition Need for long term care Scheduled now for perc G tube in IR Will recheck KUB 10/21 (mild ileus on 10/19 KUB)  Thank you for this interesting consult.  I greatly enjoyed meeting Elizabeth [name] and look forward to participating in their care.    I spent a total of 20 minutes face to face in clinical consultation, greater than 50% of which was counseling/coordinating care for percutaneous gastric tube placement  Signed: Zymeir Salminen A 08/16/2014, 4:12 PM

## 2014-08-16 NOTE — Progress Notes (Addendum)
PULMONARY / CRITICAL CARE MEDICINE   Name: Elizabeth [name] MRN: [id number] DOB: 1956/02/08    ADMISSION DATE:  07/20/2014  REFERRING MD :  Ranae PalmsYelverton, ED  CHIEF COMPLAINT:  resp distress  INITIAL PRESENTATION: [age] y/o F with severe COPD on home O2, 3rd  admission in last 2 months who presented 9/23 with AECOPD and bilateral pneumothoraces requiring mechanical ventilation.   STUDIES:   SIGNIFICANT EVENTS: 9/23  Bl chest tubes in ED 9/24  Failed SBT > tachycardic, diaphoretic 9/25  Failed SBT again 9/26  Self extubated and re tubed 9/28  Some weaning high PS 9/30  CT's to -20 cm sxn, no air leak, failed SBT in am  10/01  am hrs>>>mucous plug with pea 6 min arrest 100/7  back on MV for hypercapnia after ATC x 36h  10/08  L chest tube removed 10/11  R chest tube malpositioned. Recurrent R PTX. New R chest tube placed 10/11  received one unit PRBCs for Hgb 6.6 10/12  No ptx on CXR. No air leak on CXR. No progress weaning 10/19  Failed SBT (low volumes, anxiety) 10/19  Abd pain and distension - KUB ordered suggests mild ileus w/o obstruction  SUBJECTIVE:  Pt resting in bed, appears uncomfortable, with complains of abdominal pain, diffusely tender and distended.  Pt points to two areas on abdomen with increased pain/tenderness, one erythematous area on RLQ and on LLQ ecchymosis with blister.  Reports no flatus or BM, and during exam experienced acute onset of sharp, diffuse abdominal pain, and requests to use commode.   Denies HA, lightheadedness.  VITAL SIGNS: Temp:  [98.2 F (36.8 C)-101.1 F (38.4 C)] 99.3 F (37.4 C) (10/20 0738) Pulse Rate:  [107-125] 125 (10/20 0743) Resp:  [12-19] 12 (10/20 0743) BP: (76-100)/(45-65) 77/61 mmHg (10/20 0743) SpO2:  [96 %-100 %] 99 % (10/20 0743) FiO2 (%):  [40 %] 40 % (10/20 0743)  VENTILATOR SETTINGS: Vent Mode:  [-] PRVC FiO2 (%):  [40 %] 40 % Set Rate:  [12 bmp] 12 bmp Vt Set:  [550 mL] 550 mL PEEP:  [3 cmH20] 3 cmH20 Plateau  Pressure:  [24 cmH20-29 cmH20] 27 cmH20  INTAKE / OUTPUT:  Intake/Output Summary (Last 24 hours) at 08/16/14 1013 Last data filed at 08/16/14 0426  Gross per 24 hour  Intake    805 ml  Output   1100 ml  Net   -295 ml    PHYSICAL EXAMINATION:  Gen: chronically ill in NAD, but appears in pain HEENT: #6 trach site c/d/i, no JVD PULM: distant breath sounds, no rales, rhonchi or wheeze CV: Tachy with no murmur, gallop or rub, radial pulse 2+, dp pulse 1+ with edema AB: abd distended, firm and tympanic to percussion, diffusely tender to palpation, bruises from multiple injection sites, RLQ erythematous area roughly 6x3 cm, no indurated, no drainage, and LLQ ~4x3cm area of ecchymosis with blister Ext: warm, pedal edema 3+, no pretibial edema  Neuro: diffusely weak, no focal defcits   LABS:  Recent Labs Lab 08/10/14 0655 08/13/14 0520 08/16/14 0340  HGB 7.9* 7.6* 7.8*  HCT 26.4* 25.9* 26.2*  WBC 9.4 6.9 9.9  PLT 332 562* 836*    Recent Labs Lab 08/10/14 0655 08/13/14 0520 08/16/14 0340  NA 139 137 132*  K 4.2 4.1 3.8  CL 93* 94* 85*  CO2 37* 38* 39*  GLUCOSE 85 113* 119*  BUN 14 11 12   CREATININE 0.62 0.37* 0.41*  CALCIUM 8.6 8.7 8.9  CXR:  10/20 Large, ephysematous lung fields with bibasilar opacities improved, RML opacity still present  ABD:  10/20 Feeding tube in stomach, colon slightly distended with gas and stool, no air/fluid levels present, read suggests mild ileus w/o obstruction.  ASSESSMENT / PLAN:  PULMONARY ETT 9/23 >> 9/26, 9/26 >> 10/02 Trach (DF) 10/02 >>   A:  Acute on chronic hypoxic/hypercapnic respiratory failure AECOPD  Bilateral spontaneous pneumothoraces - chest tubes out Hx of GOLD 4 COPD with bullous emphysema Mucus plug causing arrest 10/01. P:   Slow progress with weaning Continue daily attempt at SBT - family requests to be present Cont vent bundle She will need vent SNF. Duonebs QID, Budesonide BID, with liquid mucinex  added Recommend Palliative Care discussion with patient regarding likely long term vent needs  NEUROLOGIC A:   Anxiety, controlled Pain. controlled P:   Cont Duragesic and scheduled clonazepam PRN tramadol for pain Attempt to reduce Benzo's and pain meds to help pt with inspiratory effort/strength, this has been difficult with pts stated anxiety   CARDIOVASCULAR A:  Hypotension PAF with RVR - resolved  Soft BP - 10/19 post lasix P:  Hold Metoprolol 12.5 BID, hold for SBP <100 in place Bolus 500 mL NS x1 Avoid excessive beta agonist therapy  RENAL A:  Hypervolemia, improving. Hypernatremia, resolved Edema due to low oncotic pressure Hypochloremia P:   Monitor BMET intermittently Monitor I/Os Correct electrolytes as indicated Hold further lasix with soft bp K 20 mEq x1    GASTROINTESTINAL A:   Ventilator Associated Dysphagia  Abdominal pain with distention - possible mild ileus P:   SUP: famotidine  Bowel rest, Hold TF's  Reglan Q8 x 4 doses Panda in place Needs PEG placement, re-evaluate after KUB in am 10/21 F/u KUB in AM 10/21  HEMATOLOGIC A:   ICU acquired anemia without overt bleeding Thrombocytosis P:  DVT px: LMWH Trend CBC, f/u platelets  Transfuse per usual ICU guidelines Ferrous sulfate  INFECTIOUS A:   Purulent bronchitis - completed course of abx P:   Monitor fever curve / leukocytosis    ENDOCRINE A:   Steroid associated hyperglycemia, resolved. P:   Monitor glu on chem panels Resume SSI for glu > 180  Today's Summary 74- 58 y/o F with end-stage bullous COPD with bilateral pneumothoraces s/p chest tubes & trach.  Has not made progress with weaning.  Needs vent SNF.   IR to eval for potential PEG placement but may have to be put on hold given mild ileus.  NS bolus x1 with soft BP.     Danelle BerryLeisa Tapia PA-S2  Canary BrimBrandi Ollis, NP-C [facility] Pgr: [phone] or [phone]   10:13 AM 08/16/2014  Attending:  I have  seen and examined the patient with nurse practitioner and student and agree with the note above.   Lungs with some wheezing today Comfortable on vent Did well for one hour on PSV! Start reglan today Hopefully to [facility] tomorrow Palliative care to meet with patient  Heber CarolinaBrent McQuaid, MD [facility] Pager: [phone] Cell: [phone] If no response, call [phone]

## 2014-08-17 ENCOUNTER — Inpatient Hospital Stay (HOSPITAL_COMMUNITY): Payer: BC Managed Care – PPO

## 2014-08-17 DIAGNOSIS — Z7189 Other specified counseling: Secondary | ICD-10-CM

## 2014-08-17 DIAGNOSIS — R140 Abdominal distension (gaseous): Secondary | ICD-10-CM

## 2014-08-17 DIAGNOSIS — R1310 Dysphagia, unspecified: Secondary | ICD-10-CM

## 2014-08-17 DIAGNOSIS — R531 Weakness: Secondary | ICD-10-CM

## 2014-08-17 DIAGNOSIS — Z515 Encounter for palliative care: Secondary | ICD-10-CM

## 2014-08-17 LAB — BASIC METABOLIC PANEL
Anion gap: 7 (ref 5–15)
BUN: 16 mg/dL (ref 6–23)
CALCIUM: 9 mg/dL (ref 8.4–10.5)
CO2: 39 meq/L — AB (ref 19–32)
CREATININE: 0.49 mg/dL — AB (ref 0.50–1.10)
Chloride: 88 mEq/L — ABNORMAL LOW (ref 96–112)
GFR calc Af Amer: >90 mL/min (ref >90)
GFR calc non Af Amer: >90 mL/min (ref >90)
GLUCOSE: 89 mg/dL (ref 70–99)
Potassium: 4.4 mEq/L (ref 3.7–5.3)
Sodium: 134 mEq/L — ABNORMAL LOW (ref 137–147)

## 2014-08-17 LAB — PROTIME-INR
INR: 1.2 (ref 0.00–1.49)
PROTHROMBIN TIME: 15.4 s — AB (ref 11.6–15.2)

## 2014-08-17 LAB — CBC
HCT: 24.1 % — ABNORMAL LOW (ref 36.0–46.0)
HEMOGLOBIN: 7.2 g/dL — AB (ref 12.0–15.0)
MCH: 28.6 pg (ref 26.0–34.0)
MCHC: 29.9 g/dL — AB (ref 30.0–36.0)
MCV: 95.6 fL (ref 78.0–100.0)
Platelets: 871 10*3/uL — ABNORMAL HIGH (ref 150–400)
RBC: 2.52 MIL/uL — ABNORMAL LOW (ref 3.87–5.11)
RDW: 17.2 % — ABNORMAL HIGH (ref 11.5–15.5)
WBC: 9.8 10*3/uL (ref 4.0–10.5)

## 2014-08-17 MED ORDER — CEFAZOLIN SODIUM-DEXTROSE 2-3 GM-% IV SOLR
2.0000 g | INTRAVENOUS | Status: AC
  Administered 2014-08-18: 2 g via INTRAVENOUS
  Filled 2014-08-17: qty 50

## 2014-08-17 MED ORDER — SODIUM CHLORIDE 0.9 % IV SOLN
INTRAVENOUS | Status: DC
  Administered 2014-08-17 – 2014-08-18 (×4): via INTRAVENOUS

## 2014-08-17 MED ORDER — ENOXAPARIN SODIUM 40 MG/0.4ML ~~LOC~~ SOLN: 40.0000 mg | Freq: Every day | SUBCUTANEOUS | Status: DC

## 2014-08-17 MED ORDER — VITAL AF 1.2 CAL PO LIQD
1000.0000 mL | ORAL | Status: DC
  Administered 2014-08-17: 1000 mL
  Filled 2014-08-17 (×3): qty 1000

## 2014-08-17 MED ORDER — SODIUM CHLORIDE 0.9 % IV SOLN
250.0000 mL | INTRAVENOUS | Status: DC | PRN
Start: 2014-08-17 — End: 2014-08-17

## 2014-08-17 MED ORDER — SODIUM CHLORIDE 0.9 % IV SOLN
250.0000 mL | INTRAVENOUS | Status: DC | PRN
Start: 2014-08-17 — End: 2014-08-19

## 2014-08-17 NOTE — Progress Notes (Signed)
Chaplain made initial visit with pt and two of her sisters who were bedside. One of her sisters was doing her hair when I walked in. Sisters were very lively and energetic. Patient smiled when I walked in and mouthed words though she could not speak. Sisters requested prayer for strength as they care for her sister before conclusion of visit. Pt pointed toward stomach when asked what to pray for. Sisters and pt welcome follow-up visit. Will follow.   Page if needed.  Gala RomneyBrown, Inaara Tye J, Chaplain 08/17/2014

## 2014-08-17 NOTE — Consult Note (Signed)
Patient Elizabeth [name]      DOB: 21-Jun-1956      VFI:[id number]     Consult Note from the Palliative Medicine Team at [facility]    Consult Requested by: Dr Kendrick Fries     PCP: Toma Deiters, MD Reason for Consultation:Clarification of GOC and options     Phone Number:[phone]  Assessment of patients Current state: 58 y/o F with severe COPD on home O2, 3rd admission in last 2 months who presented 9/23 with AECOPD and bilateral pneumothoraces requiring mechanical ventilation.  Remains intubated with limited success for wean.  Patient and her family are faced with advanced directive decisions and anticipatory care needs  Consult is for review of medical treatment options, clarification of goals of care and end of life issues, disposition and options, and symptom recommendation.  This NP Lorinda Creed reviewed medical records, received report from team, assessed the patient and then meet at the patient's bedside along with her husband Aashna Matson , sister Lahoma Crocker, brother Roxy Manns and his wife Royline to discuss diagnosis prognosis, GOC, EOL wishes disposition and options.  A detailed discussion was had today regarding advanced directives.  Concepts specific to code status, artifical feeding and hydration, continued IV antibiotics and rehospitalization was had.  The difference between a aggressive medical intervention path  and a palliative comfort care path for this patient at this time was had.  Values and goals of care important to patient and family were attempted to be elicited.  Concept of Hospice and Palliative Care were discussed  Natural trajectory and expectations at EOL were discussed.  Questions and concerns addressed.  Hard Choices booklet left for review. Family encouraged to call with questions or concerns.  PMT will continue to support holistically.   Goals of Care: 1.  Code Status: Full code   2. Scope of Treatment:  Patient and her family remain  hopeful for improvement and anticipate a dc to [facility] when medically appropriate.   3. Disposition: Likely to [facility] when stable for discharge   4. Symptom Management:   1. Anxiety/Agitation: Ativan 1 mg IV every 4 hrs prn 2. Pain: Fentanyl patch 50 mcg 3. Weakness/failure to thrive  5. Psychosocial:  Emotional support offered to patient and family.  Homero Fellers discussion regarding the overall poor prognosis of returning to baseline (O2 dependant at home and continued physical and functional decline over the past many months)   6. Spiritual: Spiritual care consulted     Patient Documents Completed or Given: Document Given Completed  Advanced Directives Pkt    MOST X   DNR    Gone from My Sight    Hard Choices X     Brief HPI:   CHIEF COMPLAINT: resp distress  INITIAL PRESENTATION: 58 y/o F with severe COPD on home O2, 3rd admission in last 2 months who presented 9/23 with AECOPD and bilateral pneumothoraces requiring mechanical ventilation.  STUDIES:  SIGNIFICANT EVENTS:  9/23 Bl chest tubes in ED  9/24 Failed SBT > tachycardic, diaphoretic  9/25 Failed SBT again  9/26 Self extubated and re tubed  9/28 Some weaning high PS  9/30 CT's to -20 cm sxn, no air leak, failed SBT in am  10/01 am hrs>>>mucous plug with pea 6 min arrest  100/7 back on MV for hypercapnia after ATC x 36h  10/08 L chest tube removed  10/11 R chest tube malpositioned. Recurrent R PTX. New R chest tube placed  10/11 received one unit PRBCs for Hgb 6.6  10/12 No ptx on CXR. No air leak on CXR. No progress weaning  10/19 Failed SBT (low volumes, anxiety)  10/19 Abd pain and distension - KUB ordered suggests mild ileus w/o obstruction.  10/20 Weaned for 1 hour on PSV      ROS:  Generalized abdominal discomfort   PMH:  Past Medical History  Diagnosis Date  . COPD (chronic obstructive pulmonary disease)   . Hypertension   . Acute respiratory failure   . GERD (gastroesophageal reflux disease)    . Hiatal hernia   . Hyperlipidemia   . Shortness of breath      PSH: Past Surgical History  Procedure Laterality Date  . Cholecystectomy    . Tracheostomy      feinstein   I have reviewed the FH and SH and  If appropriate update it with new information. Allergies  Allergen Reactions  . Bactrim Ds [Sulfamethoxazole-Trimethoprim] Hives  . Codeine Nausea And Vomiting  . Morphine Nausea And Vomiting  . Demerol [Meperidine] Other (See Comments)   Scheduled Meds: . antiseptic oral rinse  7 mL Mouth Rinse QID  . budesonide  0.5 mg Nebulization BID  . chlorhexidine  15 mL Mouth Rinse BID  . clonazePAM  1 mg Per Tube BID  . enoxaparin (LOVENOX) injection  40 mg Subcutaneous Daily  . famotidine  20 mg Per Tube QHS  . fentaNYL  50 mcg Transdermal Q72H  . ferrous sulfate  300 mg Per Tube Q12H  . free water  200 mL Per Tube 3 times per day  . guaiFENesin  600 mg Oral QID  . ipratropium-albuterol  3 mL Nebulization QID  . metoCLOPramide (REGLAN) injection  5 mg Intravenous 3 times per day  . metoprolol tartrate  12.5 mg Oral BID  . polyethylene glycol  17 g Oral Daily  . senna-docusate  1 tablet Oral BID  . sodium chloride  10-40 mL Intracatheter Q12H   Continuous Infusions: . sodium chloride     PRN Meds:.sodium chloride, acetaminophen (TYLENOL) oral liquid 160 mg/5 mL, bisacodyl, docusate sodium, LORazepam, ondansetron (ZOFRAN) IV, sodium chloride, traMADol    BP 89/66  Pulse 117  Temp(Src) 98.5 F (36.9 C) (Oral)  Resp 12  Ht 5\' 5"  (1.651 m)  Wt 76.5 kg (168 lb 10.4 oz)  BMI 28.07 kg/m2  SpO2 100%   PPS:30 % at best   Intake/Output Summary (Last 24 hours) at 08/17/14 1128 Last data filed at 08/17/14 0500  Gross per 24 hour  Intake      0 ml  Output    472 ml  Net   -472 ml    Physical Exam:  General: chronically ill appearing, ventilated, NAD HEENT:  Noted trach site CDI  Chest: CTA CVS: tachycardic Abdomen:  Distended, firm, +BS, tender RLQ Ext: + 2  edema, BLE Neuro: weak but alert and oriented X3  Labs: CBC    Component Value Date/Time   WBC 9.8 08/17/2014 0600   RBC 2.52* 08/17/2014 0600   RBC 2.44* 07/31/2014 1121   HGB 7.2* 08/17/2014 0600   HCT 24.1* 08/17/2014 0600   PLT 871* 08/17/2014 0600   MCV 95.6 08/17/2014 0600   MCH 28.6 08/17/2014 0600   MCHC 29.9* 08/17/2014 0600   RDW 17.2* 08/17/2014 0600   LYMPHSABS 1.8 08/08/2014 0646   MONOABS 0.8 08/08/2014 0646   EOSABS 0.2 08/08/2014 0646   BASOSABS 0.0 08/08/2014 0646    BMET    Component Value Date/Time   NA 134* 08/17/2014  0600   K 4.4 08/17/2014 0600   CL 88* 08/17/2014 0600   CO2 39* 08/17/2014 0600   GLUCOSE 89 08/17/2014 0600   BUN 16 08/17/2014 0600   CREATININE 0.49* 08/17/2014 0600   CALCIUM 9.0 08/17/2014 0600   GFRNONAA >90 08/17/2014 0600   GFRAA >90 08/17/2014 0600    CMP     Component Value Date/Time   NA 134* 08/17/2014 0600   K 4.4 08/17/2014 0600   CL 88* 08/17/2014 0600   CO2 39* 08/17/2014 0600   GLUCOSE 89 08/17/2014 0600   BUN 16 08/17/2014 0600   CREATININE 0.49* 08/17/2014 0600   CALCIUM 9.0 08/17/2014 0600   PROT 5.1* 08/05/2014 0420   ALBUMIN 2.4* 08/05/2014 0420   AST 40* 08/05/2014 0420   ALT 89* 08/05/2014 0420   ALKPHOS 90 08/05/2014 0420   BILITOT 0.9 08/05/2014 0420   GFRNONAA >90 08/17/2014 0600   GFRAA >90 08/17/2014 0600     Time In Time Out Total Time Spent with Patient Total Overall Time  1015 1145 80 min 90 min    Greater than 50%  of this time was spent counseling and coordinating care related to the above assessment and plan.   Lorinda CreedMary Chanan Detwiler NP  Palliative Medicine Team Team Phone # [phone] Pager [phone]  Discussed with Earnest RosierBrandy Ollis NP

## 2014-08-17 NOTE — Progress Notes (Signed)
Pt failed wean due to anxiety. Pt on 20/3 shaking her head mouthing that she couldn't do it, vent back up mode kicked in due to no patient effort. Placed back to full support and pt is calm again.

## 2014-08-17 NOTE — Progress Notes (Signed)
PULMONARY / CRITICAL CARE MEDICINE   Name: Elizabeth [name] MRN: [id number] DOB: 30-Jan-1956    ADMISSION DATE:  07/20/2014  REFERRING MD :  Ranae PalmsYelverton, ED  CHIEF COMPLAINT:  resp distress  INITIAL PRESENTATION: 58 y/o F with severe COPD on home O2, 3rd  admission in last 2 months who presented 9/23 with AECOPD and bilateral pneumothoraces requiring mechanical ventilation.   STUDIES:   SIGNIFICANT EVENTS: 9/23  Bl chest tubes in ED 9/24  Failed SBT > tachycardic, diaphoretic 9/25  Failed SBT again 9/26  Self extubated and re tubed 9/28  Some weaning high PS 9/30  CT's to -20 cm sxn, no air leak, failed SBT in am  10/01  am hrs>>>mucous plug with pea 6 min arrest 100/7  back on MV for hypercapnia after ATC x 36h  10/08  L chest tube removed 10/11  R chest tube malpositioned. Recurrent R PTX. New R chest tube placed 10/11  received one unit PRBCs for Hgb 6.6 10/12  No ptx on CXR. No air leak on CXR. No progress weaning 10/19  Failed SBT (low volumes, anxiety) 10/19  Abd pain and distension - KUB ordered suggests mild ileus w/o obstruction.   10/20  Weaned for 1 hour on PSV  SUBJECTIVE:   Pt c/o's abd discomfort, reports passing some gas and thinks small BM.  Failed SBT.  No other acute c/o's  VITAL SIGNS: Temp:  [98 F (36.7 C)-99.3 F (37.4 C)] 98.5 F (36.9 C) (10/21 0700) Pulse Rate:  [103-127] 117 (10/21 0822) Resp:  [9-24] 12 (10/21 0822) BP: (71-101)/(49-73) 89/66 mmHg (10/21 0822) SpO2:  [92 %-100 %] 100 % (10/21 0822) FiO2 (%):  [40 %] 40 % (10/21 0822)  VENTILATOR SETTINGS: Vent Mode:  [-] PRVC FiO2 (%):  [40 %] 40 % Set Rate:  [12 bmp] 12 bmp Vt Set:  [550 mL] 550 mL PEEP:  [3 cmH20] 3 cmH20 Pressure Support:  [18 cmH20] 18 cmH20 Plateau Pressure:  [21 cmH20-26 cmH20] 26 cmH20  INTAKE / OUTPUT:  Intake/Output Summary (Last 24 hours) at 08/17/14 1001 Last data filed at 08/17/14 0500  Gross per 24 hour  Intake      0 ml  Output    472 ml  Net   -472 ml     PHYSICAL EXAMINATION:  Gen: chronically ill in NAD HEENT: #6 trach site c/d/i, no JVD PULM: resp's even/non-labored, lungs bilaterally distant, few faint scattered wheezes CV: Tachy with no murmur, gallop or rub, pulses equal bilaterally AB: abd distended, bsx4 active, panda in place, abd tender to palpation, LLQ small area of ecchymosis with blister Ext: warm, pedal edema 3+, no pretibial edema  Neuro: diffusely weak, no focal defcits   LABS:  Recent Labs Lab 08/13/14 0520 08/16/14 0340 08/17/14 0600  HGB 7.6* 7.8* 7.2*  HCT 25.9* 26.2* 24.1*  WBC 6.9 9.9 9.8  PLT 562* 836* 871*    Recent Labs Lab 08/13/14 0520 08/16/14 0340 08/17/14 0600  NA 137 132* 134*  K 4.1 3.8 4.4  CL 94* 85* 88*  CO2 38* 39* 39*  GLUCOSE 113* 119* 89  BUN 11 12 16   CREATININE 0.37* 0.41* 0.49*  CALCIUM 8.7 8.9 9.0     CXR:  10/20 Large, ephysematous lung fields with bibasilar opacities improved, RML opacity still present  ABD:  10/21 Feeding tube in stomach, gas-filled non-distended small & large bowel c/w ileus without change ASSESSMENT / PLAN:  PULMONARY ETT 9/23 >> 9/26, 9/26 >> 10/02 Trach (DF)  10/02 >>   A:  Acute on chronic hypoxic/hypercapnic respiratory failure AECOPD  Bilateral spontaneous pneumothoraces - chest tubes out Hx of GOLD 4 COPD with bullous emphysema Mucus plug causing arrest 10/01. P:   Continue daily attempt at SBT - family requests to be present Cont vent bundle She will need vent LTAC Duonebs QID, Budesonide BID, with liquid mucinex added  NEUROLOGIC A:   Anxiety, controlled Pain. controlled P:   Cont Duragesic and scheduled clonazepam PRN tramadol for pain  CARDIOVASCULAR A:  Hypotension PAF with RVR - resolved  Soft BP - 10/19 post lasix P:  Hold Metoprolol 12.5 BID, hold for SBP <100 in place Increase NS to 100 ml/hr 10/21 Avoid excessive beta agonist therapy  RENAL A:  Hypervolemia, improving. Hypernatremia, resolved Edema  due to low oncotic pressure Hypochloremia  P:   Monitor BMET intermittently Monitor I/Os Correct electrolytes as indicated Hold further lasix with soft bp    GASTROINTESTINAL A:   Ventilator Associated Dysphagia  Abdominal pain with distention - ileus on KUB P:   SUP: famotidine  Bowel rest, Resume TF at 5510ml/hr, no advancement for now Reglan Q8 x 4 doses Panda in place Needs PEG placement, hold for now given ileus F/u KUB in AM 10/22  HEMATOLOGIC A:   ICU acquired anemia without overt bleeding Thrombocytosis P:  DVT px: LMWH Trend CBC, f/u platelets  Transfuse per usual ICU guidelines Ferrous sulfate  INFECTIOUS A:   Purulent bronchitis - completed course of abx P:   Monitor fever curve / leukocytosis    ENDOCRINE A:   Steroid associated hyperglycemia, resolved. P:   Monitor glu on chem panels Resume SSI for glu > 180  Today's Summary 18- 58 y/o F with end-stage bullous COPD with bilateral pneumothoraces s/p chest tubes & trach.  Has had difficult time weaning. Ileus on KUB noted.  Reglan in place.  Hold PEG placement for now.  Re-evaluate KUB in am.  Close to d/c to LTAC.     Canary BrimBrandi Ollis, NP-C [facility] Pgr: [phone] or [phone]   10:01 AM 08/17/2014   Attending:  I have seen and examined the patient with nurse practitioner/resident and agree with the note above.   Awake today, has not been on PSV due to anxiety Ileus > move towards PEG Likely [facility] tomorrow  Heber CarolinaBrent McQuaid, MD [facility] Pager: [phone] Cell: [phone] If no response, call [phone]

## 2014-08-17 NOTE — Progress Notes (Signed)
Physical Therapy Treatment Patient Details Name: Elizabeth [name] MRN: [id number] DOB: 1956-06-06 Today's Date: 08/17/2014    History of Present Illness pt presents with Respiratory Failure requiring ventilation, Bil Pneumothoraces, and COPD exacerbation.  pt with 4 recent admits for respiratory difficulties.      PT Comments    Pt just positioned in chair position in bed after bath with Nsg tech and requests to stay in chair position at this time.  Pt agreeable to LE ther ex.  Vitals remained stable throughout session.  Will continue to follow along.    Follow Up Recommendations  LTACH     Equipment Recommendations  None recommended by PT    Recommendations for Other Services       Precautions / Restrictions Precautions Precautions: Fall Precaution Comments: Vent, NG tube, Increase HR Restrictions Weight Bearing Restrictions: No    Mobility  Bed Mobility               General bed mobility comments: pt just placed in chair positioning by Nsg tech after bath.  pt indicates feeling comfortable in this position and requesting to stay in chair position.    Transfers                    Ambulation/Gait                 Stairs            Wheelchair Mobility    Modified Rankin (Stroke Patients Only)       Balance                                    Cognition Arousal/Alertness: Awake/alert Behavior During Therapy: WFL for tasks assessed/performed Overall Cognitive Status: Difficult to assess                      Exercises General Exercises - Lower Extremity Ankle Circles/Pumps: AROM;Both;10 reps Long Arc Quad: AROM;Both;10 reps Hip ABduction/ADduction: AROM;Both;10 reps Hip Flexion/Marching: AROM;Both;10 reps    General Comments        Pertinent Vitals/Pain Pain Assessment: Faces Faces Pain Scale: Hurts little more Pain Location: Abdomen Pain Intervention(s): Monitored during session;Repositioned    Home  Living                      Prior Function            PT Goals (current goals can now be found in the care plan section) Acute Rehab PT Goals PT Goal Formulation: With patient/family Time For Goal Achievement: 08/23/14 Potential to Achieve Goals: Good Progress towards PT goals: Progressing toward goals    Frequency  Min 2X/week    PT Plan Current plan remains appropriate    Co-evaluation             End of Session Equipment Utilized During Treatment:  (Vent) Activity Tolerance: Patient tolerated treatment well Patient left: in bed;with call bell/phone within reach;with family/visitor present (Bed in chair position at 60*)     Time: [id number] PT Time Calculation (min): 20 min  Charges:  $Therapeutic Exercise: 8-22 mins                    G CodesSunny Schlein:      Javoris Star F, [place]PT [phone] 08/17/2014, 2:42 PM

## 2014-08-18 ENCOUNTER — Inpatient Hospital Stay (HOSPITAL_COMMUNITY): Payer: BC Managed Care – PPO

## 2014-08-18 ENCOUNTER — Encounter (HOSPITAL_COMMUNITY): Payer: Self-pay | Admitting: General Practice

## 2014-08-18 LAB — CBC
HCT: 23.5 % — ABNORMAL LOW (ref 36.0–46.0)
HEMOGLOBIN: 7 g/dL — AB (ref 12.0–15.0)
MCH: 28.2 pg (ref 26.0–34.0)
MCHC: 29.8 g/dL — ABNORMAL LOW (ref 30.0–36.0)
MCV: 94.8 fL (ref 78.0–100.0)
Platelets: 834 10*3/uL — ABNORMAL HIGH (ref 150–400)
RBC: 2.48 MIL/uL — AB (ref 3.87–5.11)
RDW: 17 % — ABNORMAL HIGH (ref 11.5–15.5)
WBC: 8 10*3/uL (ref 4.0–10.5)

## 2014-08-18 LAB — BASIC METABOLIC PANEL
Anion gap: 8 (ref 5–15)
BUN: 12 mg/dL (ref 6–23)
CO2: 35 mEq/L — ABNORMAL HIGH (ref 19–32)
Calcium: 8.7 mg/dL (ref 8.4–10.5)
Chloride: 91 mEq/L — ABNORMAL LOW (ref 96–112)
Creatinine, Ser: 0.43 mg/dL — ABNORMAL LOW (ref 0.50–1.10)
GFR calc non Af Amer: >90 mL/min (ref >90)
GLUCOSE: 76 mg/dL (ref 70–99)
POTASSIUM: 4 meq/L (ref 3.7–5.3)
Sodium: 134 mEq/L — ABNORMAL LOW (ref 137–147)

## 2014-08-18 MED ORDER — CLONAZEPAM 1 MG PO TABS: 1.0000 mg | ORAL_TABLET | Freq: Two times a day (BID) | ORAL | Status: DC

## 2014-08-18 MED ORDER — SODIUM CHLORIDE 0.9 % IV SOLN
INTRAVENOUS | Status: DC
  Administered 2014-08-18: 13:00:00 via INTRAVENOUS

## 2014-08-18 MED ORDER — CETYLPYRIDINIUM CHLORIDE 0.05 % MT LIQD
7.0000 mL | Freq: Four times a day (QID) | OROMUCOSAL | Status: AC
Start: 2014-08-18 — End: ?

## 2014-08-18 MED ORDER — FENTANYL 50 MCG/HR TD PT72: 50.0000 ug | MEDICATED_PATCH | TRANSDERMAL | Status: DC

## 2014-08-18 MED ORDER — LORAZEPAM 2 MG/ML IJ SOLN: 1.0000 mg | INTRAMUSCULAR | Status: AC | PRN

## 2014-08-18 MED ORDER — LIDOCAINE HCL 1 % IJ SOLN
INTRAMUSCULAR | Status: AC
  Filled 2014-08-18: qty 20

## 2014-08-18 MED ORDER — METOCLOPRAMIDE HCL 5 MG/ML IJ SOLN
5.0000 mg | Freq: Two times a day (BID) | INTRAMUSCULAR | Status: DC
  Administered 2014-08-18: 5 mg via INTRAVENOUS
  Filled 2014-08-18 (×2): qty 1

## 2014-08-18 MED ORDER — METOCLOPRAMIDE HCL 5 MG/ML IJ SOLN: 5.0000 mg | Freq: Two times a day (BID) | INTRAMUSCULAR | Status: DC

## 2014-08-18 MED ORDER — SODIUM CHLORIDE 0.9 % IV SOLN
INTRAVENOUS | Status: AC | PRN
  Administered 2014-08-18: 10 mL/h via INTRAVENOUS

## 2014-08-18 MED ORDER — IOHEXOL 300 MG/ML  SOLN
50.0000 mL | Freq: Once | INTRAMUSCULAR | Status: AC | PRN
  Administered 2014-08-18: 15 mL

## 2014-08-18 MED ORDER — ENOXAPARIN SODIUM 40 MG/0.4ML ~~LOC~~ SOLN: 40.0000 mg | Freq: Every day | SUBCUTANEOUS | Status: AC

## 2014-08-18 MED ORDER — FENTANYL CITRATE 0.05 MG/ML IJ SOLN
INTRAMUSCULAR | Status: AC | PRN
  Administered 2014-08-18: 25 ug via INTRAVENOUS

## 2014-08-18 MED ORDER — FERROUS SULFATE 300 (60 FE) MG/5ML PO SYRP: 300.0000 mg | ORAL_SOLUTION | Freq: Two times a day (BID) | ORAL | Status: AC

## 2014-08-18 MED ORDER — FENTANYL CITRATE 0.05 MG/ML IJ SOLN
INTRAMUSCULAR | Status: AC
  Filled 2014-08-18: qty 2

## 2014-08-18 MED ORDER — MIDAZOLAM HCL 2 MG/2ML IJ SOLN
INTRAMUSCULAR | Status: AC
  Filled 2014-08-18: qty 2

## 2014-08-18 MED ORDER — SODIUM CHLORIDE 0.9 % IV SOLN: 250.0000 mL | INTRAVENOUS | Status: AC | PRN

## 2014-08-18 MED ORDER — VITAL AF 1.2 CAL PO LIQD: 1000.0000 mL | ORAL | Status: AC

## 2014-08-18 MED ORDER — MIDAZOLAM HCL 2 MG/2ML IJ SOLN
INTRAMUSCULAR | Status: AC | PRN
  Administered 2014-08-18: 0.5 mg via INTRAVENOUS

## 2014-08-18 MED ORDER — FAMOTIDINE 40 MG/5ML PO SUSR: 20.0000 mg | Freq: Every day | ORAL | Status: AC

## 2014-08-18 MED ORDER — CHLORHEXIDINE GLUCONATE 0.12 % MT SOLN: 15.0000 mL | Freq: Two times a day (BID) | OROMUCOSAL | Status: AC

## 2014-08-18 MED ORDER — IPRATROPIUM-ALBUTEROL 0.5-2.5 (3) MG/3ML IN SOLN: 3.0000 mL | Freq: Four times a day (QID) | RESPIRATORY_TRACT | Status: AC

## 2014-08-18 MED ORDER — GLUCAGON HCL RDNA (DIAGNOSTIC) 1 MG IJ SOLR
INTRAMUSCULAR | Status: AC | PRN
  Administered 2014-08-18: 1 mg via INTRAVENOUS

## 2014-08-18 MED ORDER — FREE WATER: 200.0000 mL | Freq: Three times a day (TID) | Status: AC

## 2014-08-18 MED ORDER — ACETAMINOPHEN 160 MG/5ML PO SOLN: 650.0000 mg | Freq: Four times a day (QID) | ORAL | Status: AC | PRN

## 2014-08-18 MED ORDER — GUAIFENESIN 100 MG/5ML PO SOLN: 600.0000 mg | Freq: Four times a day (QID) | ORAL | Status: AC

## 2014-08-18 MED ORDER — GLUCAGON HCL RDNA (DIAGNOSTIC) 1 MG IJ SOLR
INTRAMUSCULAR | Status: AC
  Filled 2014-08-18: qty 1

## 2014-08-18 NOTE — Sedation Documentation (Signed)
Patient denies pain and is resting comfortably.  

## 2014-08-18 NOTE — Progress Notes (Signed)
Pt failed wean on PS 24, CPAP of 5 this am at 0750. Pt RR only 4, minute ventilation <2, backup ventilation kicked in several times, pt using accessory muscles saying it was hard to breathe.

## 2014-08-18 NOTE — Progress Notes (Signed)
S/p peg placement, pt alert orilented, no c/o pain , peg site with dressing dry and intact, [facility]ess),vital signs stable, waiting for [facility].for transport.

## 2014-08-18 NOTE — Discharge Summary (Signed)
Physician Discharge Summary  Patient ID: Elizabeth [name] MRN: [id number] DOB/AGE: [birth year] 56 y.o.  Admit date: 07/20/2014 Discharge date: 08/18/2014    Discharge Diagnoses:  Acute on Chronic Hypoxic / Hypercapnic Respiratory Failure AECOPD Bilateral Spontaneous Pneumothoraces Hx Gold 4 COPD with Bullous Emphysema Mucus Plugging causing arrest 10/1 Purulent Bronchitis  Bilateral Pleural Effusions Tracheostomy Status Anxiety  Pain  Severe Deconditioning  Hypotension  PAF with RVR, resolved.  Hypervolemia  Hypernatremia Edema secondary to low oncotic pressures Hypochloremia  Ventilator Associated Dysphagia Abdominal Pain  Ileus  Protein Calorie Malnutrition PEG Status Anemia Thrombocytosis  Steroid Associated Hyperglycemia                                                                        DISCHARGE PLAN BY DIAGNOSIS     Acute on Chronic Hypoxic / Hypercapnic Respiratory Failure AECOPD Bilateral Spontaneous Pneumothoraces Hx Gold 4 COPD with Bullous Emphysema Mucus Plugging causing arrest 10/1 Purulent Bronchitis  Bilateral Pleural Effusions  Discharge Plan: -Full vent support:  PRVC 550 / Rate 12 / PEEP 5 / FiO2 40% -Daily SBT, slow vent weaning -Continue Pulmicort 0.5 mg BID & Duoneb QID -Aggressive PT efforts, Mobilize -PRN CXR  -Trach care BID & PRN, change inner cannula daily  -Continue guaifenesin   Anxiety  Pain  Severe Deconditioning   Discharge Plan: -Caution with sedating medications -Continue 61m BID klonopin, duragesic patch 50 mcg Q 72 hours -Mobilize   Hypotension  PAF with RVR, resolved  Discharge Plan: -KVO IVF -Monitor EKG   Hypervolemia  Hypernatremia Edema secondary to low oncotic pressures Hypochloremia   Discharge Plan: -Continue free water 200 Q8 -Hold further lasix given soft blood pressures  Ventilator Associated Dysphagia Abdominal Pain  Ileus  Protein Calorie Malnutrition  PEG Status   Discharge  Plan: -PEG site care daily & PRN  -PEG may be used for medications immediately.  Do not initiate TF via PEG until 10/24.   -continue Vital AF 1.2 at 10 ml /hr.  Rate reduced with ileus.  -Follow up KUB in am 10/23, if resolved ileus, escalate TF to goal per nutrition  -Continue Reglan [id number] through 10/23.  Stop after last dose on 10/23.    Anemia Thrombocytosis   Discharge Plan: -Trend CBC -Continue ferrous sulfate -Lovenox for DVT prophylaxis, currently on hold for PEG 10/22.  Consider restart 10/23 if no bleeding from PEG site.    Steroid Associated Hyperglycemia   Discharge Plan: -Resolved, no futher follow up at this time.                   DISCHARGE SUMMARY   TeGRACELAND WACHTERs [age]/o female with a PMH of HTN, HLD, GERD, Hiatal Hernia, Severe COPD on home O2 with multiple (4) admissions in last 6 months who presented 9/23 with 24 hours of progressive shortness of breath.  On arrival, she was noted to have saturations of 64% on 3L per [place]. The patient progressed to somnolence with need for stimulation to take a deep breath and was urgently intubated in the ER. She further was noted to have decreased breath sounds and distended abdomen and soft tissues of the chest.  Left chest tube was placed for concerns  of pneumothorax. Follow up chest xray post left chest tube placement showed a 40% right pneumothorax requiring chest tube placement in the ER.   She was treated with solumedrol & nebulized bronchodilators. PCCM called for ICU admission.    Patient was admitted to ICU, chest tubes placed to suction, solumedrol & duonebs continued.  Serial chest xrays were monitored in the setting of bilateral pneumothoraces. She was treated with empiric levaquin.  Central line was placed for volume assessment and medication administration. Patient developed hypotension requiring levophed.  Patient failed early SBT's in the setting of anxiety. On 9/26, the patient self extubated and failed  requiring reintubation.  She required prolonged mechanical ventilation support.  Early am of 07/28/14, she suffered mucus plugging with subsequent 6 minute PEA arrest.  She continued to wean and progressed to ATC but had to be placed back on mechanical ventilation after 36 hours due to hypercarbia.  The left chest tube was removed on 10/8 without difficulty.  On 10/11 the R chest tube was noted to be malpositioned with recurrent R pneumothorax.  A new R CT was placed without difficulty (since removed).  Hospital course further complicated by anemia requiring one unit of PRBC's, difficulty with weaning efforts (inconsistent) and development of ileus on 10/19.  She was treated with bowel rest & reglan with improvement in ileus.  10/22 she was evaluated by IR and PEG tube was placed.  Patient is medically clear for discharge to [facility] for further rehab efforts / weaning.  See discharge plans as above.                 SIGNIFICANT EVENTS / STUDIES:  9/23 Bl chest tubes in ED  9/24 Failed SBT > tachycardic, diaphoretic  9/25 Failed SBT again  9/26 Self extubated and re tubed  9/28 Some weaning high PS  9/30 CT's to -20 cm sxn, no air leak, failed SBT in am  10/01 am hrs>>>mucous plug with pea 6 min arrest  100/7 back on MV for hypercapnia after ATC x 36h  10/08 L chest tube removed  10/11 R chest tube malpositioned. Recurrent R PTX. New R chest tube placed  10/11 received one unit PRBCs for Hgb 6.6  10/12 No ptx on CXR. No air leak on CXR. No progress weaning  10/19 Failed SBT (low volumes, anxiety)  10/19 Abd pain and distension - KUB ordered suggests mild ileus w/o obstruction.  10/20 Weaned for 1 hour on PSV  10/20 Tube feeding held for ileus, treated with reglan, IR consult for PEG waiting for Ileus resolution  10/21 KUB showed no change in ileus, pt reports flatus, trickle feeds resumed NPO after midnight  10/22 KUB shows improved ileus     Discharge Exam: Gen: chronically ill in  NAD  HEENT: #6 trach site c/d/i, no JVD  PULM: resp's even/non-labored, lungs bilaterally distant, bilateral rhonchi  CV: Tachy with no murmur, gallop or rub, pulses equal bilaterally  AB: bsx4 active, tympanic to percussion, decreased firmness, not ttpn, panda in place, LLQ small area of ecchymosis with blister improving  Ext: warm, pedal edema 3+, pretibial edema 2+, no erythema, skin intact in LE  Neuro: diffusely weak, no focal defcits   Filed Vitals:   08/18/14 1549 08/18/14 1552 08/18/14 1613 08/18/14 1620  BP: 121/78 116/72    Pulse: 102 106 106   Temp:      TempSrc:      Resp: _0 Height:  Weight:      SpO2: 100% 100% 100% 100%     Discharge Labs  BMET  Recent Labs Lab 08/13/14 0520 08/16/14 0340 08/17/14 0600 08/18/14 0500  NA 137 132* 134* 134*  K 4.1 3.8 4.4 4.0  CL 94* 85* 88* 91*  CO2 38* 39* 39* 35*  GLUCOSE 113* 119* 89 76  BUN _0 CREATININE 0.37* 0.41* 0.49* 0.43*  CALCIUM 8.7 8.9 9.0 8.7    CBC  Recent Labs Lab 08/16/14 0340 08/17/14 0600 08/18/14 0500  HGB 7.8* 7.2* 7.0*  HCT 26.2* 24.1* 23.5*  WBC 9.9 9.8 8.0  PLT 836* 871* 834*   Anti-Coagulation  Recent Labs Lab 08/17/14 0600  INR 1.20      Medication List    STOP taking these medications       albuterol (2.5 MG/3ML) 0.083% nebulizer solution  Commonly known as:  PROVENTIL     alendronate 70 MG tablet  Commonly known as:  FOSAMAX     arformoterol 15 MCG/2ML Nebu  Commonly known as:  BROVANA     aspirin 81 MG tablet     LORazepam 1 MG tablet  Commonly known as:  ATIVAN  Replaced by:  LORazepam 2 MG/ML injection     pantoprazole 40 MG tablet  Commonly known as:  PROTONIX     predniSONE 10 MG tablet  Commonly known as:  DELTASONE     PROAIR HFA 108 (90 BASE) MCG/ACT inhaler  Generic drug:  albuterol     simvastatin 20 MG tablet  Commonly known as:  ZOCOR     tiotropium 18 MCG inhalation capsule  Commonly known as:  SPIRIVA      valACYclovir 500 MG tablet  Commonly known as:  VALTREX      TAKE these medications       acetaminophen 160 MG/5ML solution  Commonly known as:  TYLENOL  Place 20.3 mLs (650 mg total) into feeding tube every 6 (six) hours as needed for mild pain or fever.     antiseptic oral rinse 0.05 % Liqd solution  Commonly known as:  CPC / CETYLPYRIDINIUM CHLORIDE 0.05%  7 mLs by Mouth Rinse route QID.     budesonide 0.5 MG/2ML nebulizer solution  Commonly known as:  PULMICORT  Take 2 mLs (0.5 mg total) by nebulization 2 (two) times daily.     chlorhexidine 0.12 % solution  Commonly known as:  PERIDEX  15 mLs by Mouth Rinse route 2 (two) times daily.     clonazePAM 1 MG tablet  Commonly known as:  KLONOPIN  Place 1 tablet (1 mg total) into feeding tube 2 (two) times daily.     enoxaparin 40 MG/0.4ML injection  Commonly known as:  LOVENOX  Inject 0.4 mLs (40 mg total) into the skin daily.  Start taking on:  08/19/2014     famotidine 40 MG/5ML suspension  Commonly known as:  PEPCID  Place 2.5 mLs (20 mg total) into feeding tube at bedtime.     feeding supplement (VITAL AF 1.2 CAL) Liqd  Place 1,000 mLs into feeding tube continuous. Per Nutrition recommendations.  Ok to restart TF on 10/24 via PEG  Start taking on:  08/20/2014     fentaNYL 50 MCG/HR  Commonly known as:  DURAGESIC - dosed mcg/hr  Place 1 patch (50 mcg total) onto the skin every 3 (three) days.     ferrous sulfate 300 (60 FE) MG/5ML syrup  Place 5 mLs (300 mg total)  into feeding tube every 12 (twelve) hours.     free water Soln  Place 200 mLs into feeding tube every 8 (eight) hours.     guaiFENesin 100 MG/5ML Soln  Commonly known as:  ROBITUSSIN  Take 30 mLs (600 mg total) by mouth 4 (four) times daily.     ipratropium-albuterol 0.5-2.5 (3) MG/3ML Soln  Commonly known as:  DUONEB  Take 3 mLs by nebulization 4 (four) times daily.     LORazepam 2 MG/ML injection  Commonly known as:  ATIVAN  Inject 0.5 mLs (1  mg total) into the vein every 4 (four) hours as needed for anxiety.     metoCLOPramide 5 MG/ML injection  Commonly known as:  REGLAN  Inject 1 mL (5 mg total) into the vein every 12 (twelve) hours.     metoprolol tartrate 25 MG tablet  Commonly known as:  LOPRESSOR  Take 12.5 mg by mouth 2 (two) times daily.     sodium chloride 0.9 % infusion  Inject 250 mLs into the vein as needed (if IV carrier fluid needed.).        Follow-up Information   Follow up with HASANAJ,XAJE A, MD. (As needed, follow up to be determined per [facility]. )    Specialty:  Internal Medicine   Contact information:   [address] [place] [address] [phone]        Disposition:  [facility]   Discharged Condition: CLEONE HULICK has met maximum benefit of inpatient care and is medically stable and cleared for discharge.  Patient is pending follow up as above.      Time spent on disposition:  Greater than 35 minutes.   Signed: Noe Gens, NP-C [facility] Pgr: [phone] Office: [phone]

## 2014-08-18 NOTE — Progress Notes (Signed)
Pt transport on vent to/from IR w/ no apparent complication.

## 2014-08-18 NOTE — Progress Notes (Signed)
PULMONARY / CRITICAL CARE MEDICINE   Name: Elizabeth [name] MRN: [id number] DOB: 1956-05-06    ADMISSION DATE:  07/20/2014  REFERRING MD :  Ranae PalmsYelverton, ED  CHIEF COMPLAINT:  resp distress  INITIAL PRESENTATION: 58 y/o F with severe COPD on home O2, 3rd  admission in last 2 months who presented 9/23 with AECOPD and bilateral pneumothoraces requiring mechanical ventilation.   STUDIES:   SIGNIFICANT EVENTS: 9/23  Bl chest tubes in ED 9/24  Failed SBT > tachycardic, diaphoretic 9/25  Failed SBT again 9/26  Self extubated and re tubed 9/28  Some weaning high PS 9/30  CT's to -20 cm sxn, no air leak, failed SBT in am  10/01  am hrs>>>mucous plug with pea 6 min arrest 100/7  back on MV for hypercapnia after ATC x 36h  10/08  L chest tube removed 10/11  R chest tube malpositioned. Recurrent R PTX. New R chest tube placed 10/11  received one unit PRBCs for Hgb 6.6 10/12  No ptx on CXR. No air leak on CXR. No progress weaning 10/19  Failed SBT (low volumes, anxiety) 10/19  Abd pain and distension - KUB ordered suggests mild ileus w/o obstruction.   10/20  Weaned for 1 hour on PSV 10/20  Tube feeding held for ileus, treated with reglan, IR consult for PEG waiting for Ileus resolution 10/21  KUB showed no change in ileus, pt reports flatus, trickle feeds resumed NPO after midnight 10/22  KUB shows improved ileus  SUBJECTIVE:   Pt reports less abd discomfort, failed SBT  VITAL SIGNS: Temp:  [98.5 F (36.9 C)-99.1 F (37.3 C)] 98.8 F (37.1 C) (10/22 0730) Pulse Rate:  [98-113] 113 (10/22 0800) Resp:  [12-35] 27 (10/22 0800) BP: (84-120)/(55-69) 106/59 mmHg (10/22 0800) SpO2:  [98 %-100 %] 98 % (10/22 0800) FiO2 (%):  [40 %] 40 % (10/22 0751)  VENTILATOR SETTINGS: Vent Mode:  [-] PRVC FiO2 (%):  [40 %] 40 % Set Rate:  [12 bmp] 12 bmp Vt Set:  [550 mL] 550 mL PEEP:  [3 cmH20] 3 cmH20 Plateau Pressure:  [22 cmH20-25 cmH20] 24 cmH20  INTAKE / OUTPUT:  Intake/Output Summary (Last  24 hours) at 08/18/14 0943 Last data filed at 08/18/14 0800  Gross per 24 hour  Intake   1660 ml  Output    850 ml  Net    810 ml    PHYSICAL EXAMINATION: Gen: chronically ill in NAD HEENT: #6 trach site c/d/i, no JVD PULM: resp's even/non-labored, lungs bilaterally distant, bilateral rhonchi CV: Tachy with no murmur, gallop or rub, pulses equal bilaterally  AB:  bsx4 active, tympanic to percussion, decreased firmness, not ttpn, panda in place, LLQ small area of ecchymosis with blister improving Ext: warm, pedal edema 3+, pretibial edema  2+, no erythema, skin intact in LE Neuro: diffusely weak, no focal defcits   LABS:  Recent Labs Lab 08/16/14 0340 08/17/14 0600 08/18/14 0500  HGB 7.8* 7.2* 7.0*  HCT 26.2* 24.1* 23.5*  WBC 9.9 9.8 8.0  PLT 836* 871* 834*    Recent Labs Lab 08/13/14 0520 08/16/14 0340 08/17/14 0600 08/18/14 0500  NA 137 132* 134* 134*  K 4.1 3.8 4.4 4.0  CL 94* 85* 88* 91*  CO2 38* 39* 39* 35*  GLUCOSE 113* 119* 89 76  BUN 11 12 16 12   CREATININE 0.37* 0.41* 0.49* 0.43*  CALCIUM 8.7 8.9 9.0 8.7     CXR:  10/20 Large, ephysematous lung fields with bibasilar opacities improved,  RML opacity still present  ABD:  10/22 Feeding tube in stomach, gas-filled non-distended small & large bowel c/w resolving ileus   ASSESSMENT / PLAN:  PULMONARY ETT 9/23 >> 9/26, 9/26 >> 10/02 Trach (DF) 10/02 >>   A:  Acute on chronic hypoxic/hypercapnic respiratory failure AECOPD  Bilateral spontaneous pneumothoraces - chest tubes out Hx of GOLD 4 COPD with bullous emphysema Mucus plug causing arrest 10/01. Pleural Effusions - bilateral P:   Continue daily attempt at SBT - family requests to be present Cont vent bundle She will need vent LTAC Duonebs QID, Budesonide BID, with liquid mucinex   NEUROLOGIC A:   Anxiety, controlled Pain. controlled P:   Cont Duragesic and scheduled clonazepam PRN tramadol for pain  CARDIOVASCULAR A:   Hypotension PAF with RVR - resolved  Soft BP - 10/19 post lasix P:  Hold Metoprolol 12.5 BID, hold for SBP <100 in place Reduce NS rate to 50 mL/h with increasing LE edema Avoid excessive beta agonist therapy  RENAL A:  Hypervolemia, improving. Hypernatremia, resolved Edema due to low oncotic pressure Hypochloremia  P:   Monitor BMET intermittently Monitor I/Os Correct electrolytes as indicated IVF as above   GASTROINTESTINAL A:   Ventilator Associated Dysphagia  Abdominal pain with distention - ileus on KUB - resolving 10/22 P:   SUP: famotidine  Panda in place Continue miralax TF 10/21 at 5110ml/hr, then NPO after midnight for PEG placement IR to place PEG this afternoon Reglan x 2 more doses  HEMATOLOGIC A:   ICU acquired anemia without overt bleeding - Hb 7.0 on 10/22, decreased from 10/20 7.8 suspected due to dilution with increased IVF Thrombocytosis P:  DVT px: LMWH, held 10/22 for IR PEG placement, replace SCD's if pt will tolerate Trend CBC, f/u platelets  Transfuse per usual ICU guidelines Ferrous sulfate  INFECTIOUS A:   Purulent bronchitis - completed course of abx P:   Monitor fever curve / leukocytosis  Ancef ordered 10/22 1500 for IR PEG   ENDOCRINE A:   Steroid associated hyperglycemia, resolved. P:   Monitor glu on chem panels Resume SSI for glu > 180   Today's Summary 66- 58 y/o F with end-stage bullous COPD with bilateral pneumothoraces s/p chest tubes & trach.  Has had difficult time weaning.   Ileus on KUB resolving, con't to treat today with reglan.  IR to place PEG and then d/c to LTAC.     Canary BrimBrandi Ollis, NP-C [facility] Pgr: [phone] or [phone]   9:43 AM 08/18/2014   Attending:  I have seen and examined the patient with nurse practitioner/resident and agree with the note above.   Ileus improved PEG today [facility] today  Heber CarolinaBrent Karcyn Menn, MD [facility] Pager: [phone] Cell: [phone] If no  response, call [phone]

## 2014-08-18 NOTE — Discharge Summary (Signed)
Elizabeth McQuaid, MD  PCCM Pager: [phone] Cell: [phone] If no response, call [phone]  

## 2014-08-18 NOTE — Procedures (Signed)
Successful fluoroscopic guided insertion of gastrostomy tube without immediate post procedural complicatoin.   The gastrostomy tube may be used immediately for medications.  Tube feeds may be initiated in 24 hours as per the primary team.   

## 2014-09-18 ENCOUNTER — Inpatient Hospital Stay (HOSPITAL_COMMUNITY)
Admission: AD | Admit: 2014-09-18 | Discharge: 2014-09-19 | DRG: 207 | Disposition: A | Discharge status: Skilled Nursing Facility | Payer: BC Managed Care – PPO | Source: Other Acute Inpatient Hospital | Attending: Internal Medicine | Admitting: Internal Medicine

## 2014-09-18 ENCOUNTER — Inpatient Hospital Stay (HOSPITAL_COMMUNITY): Payer: BC Managed Care – PPO

## 2014-09-18 ENCOUNTER — Encounter (HOSPITAL_COMMUNITY): Payer: Self-pay | Admitting: *Deleted

## 2014-09-18 DIAGNOSIS — Z931 Gastrostomy status: Secondary | ICD-10-CM

## 2014-09-18 DIAGNOSIS — R0602 Shortness of breath: Secondary | ICD-10-CM

## 2014-09-18 DIAGNOSIS — F419 Anxiety disorder, unspecified: Secondary | ICD-10-CM | POA: Diagnosis present

## 2014-09-18 DIAGNOSIS — Z882 Allergy status to sulfonamides status: Secondary | ICD-10-CM

## 2014-09-18 DIAGNOSIS — J440 Chronic obstructive pulmonary disease with acute lower respiratory infection: Principal | ICD-10-CM | POA: Diagnosis present

## 2014-09-18 DIAGNOSIS — Z79899 Other long term (current) drug therapy: Secondary | ICD-10-CM

## 2014-09-18 DIAGNOSIS — K219 Gastro-esophageal reflux disease without esophagitis: Secondary | ICD-10-CM | POA: Diagnosis present

## 2014-09-18 DIAGNOSIS — Z8701 Personal history of pneumonia (recurrent): Secondary | ICD-10-CM | POA: Diagnosis not present

## 2014-09-18 DIAGNOSIS — E785 Hyperlipidemia, unspecified: Secondary | ICD-10-CM | POA: Diagnosis present

## 2014-09-18 DIAGNOSIS — K631 Perforation of intestine (nontraumatic): Secondary | ICD-10-CM | POA: Diagnosis present

## 2014-09-18 DIAGNOSIS — D649 Anemia, unspecified: Secondary | ICD-10-CM | POA: Diagnosis present

## 2014-09-18 DIAGNOSIS — K567 Ileus, unspecified: Secondary | ICD-10-CM | POA: Diagnosis present

## 2014-09-18 DIAGNOSIS — Z87891 Personal history of nicotine dependence: Secondary | ICD-10-CM | POA: Diagnosis not present

## 2014-09-18 DIAGNOSIS — I480 Paroxysmal atrial fibrillation: Secondary | ICD-10-CM | POA: Diagnosis present

## 2014-09-18 DIAGNOSIS — Z930 Tracheostomy status: Secondary | ICD-10-CM | POA: Diagnosis not present

## 2014-09-18 DIAGNOSIS — J9620 Acute and chronic respiratory failure, unspecified whether with hypoxia or hypercapnia: Secondary | ICD-10-CM | POA: Diagnosis not present

## 2014-09-18 DIAGNOSIS — E870 Hyperosmolality and hypernatremia: Secondary | ICD-10-CM | POA: Diagnosis present

## 2014-09-18 DIAGNOSIS — I10 Essential (primary) hypertension: Secondary | ICD-10-CM | POA: Diagnosis present

## 2014-09-18 DIAGNOSIS — K5909 Other constipation: Secondary | ICD-10-CM | POA: Diagnosis present

## 2014-09-18 DIAGNOSIS — J9610 Chronic respiratory failure, unspecified whether with hypoxia or hypercapnia: Secondary | ICD-10-CM | POA: Diagnosis not present

## 2014-09-18 DIAGNOSIS — R1310 Dysphagia, unspecified: Secondary | ICD-10-CM | POA: Diagnosis present

## 2014-09-18 DIAGNOSIS — R140 Abdominal distension (gaseous): Secondary | ICD-10-CM | POA: Diagnosis present

## 2014-09-18 DIAGNOSIS — I959 Hypotension, unspecified: Secondary | ICD-10-CM | POA: Diagnosis present

## 2014-09-18 DIAGNOSIS — Z9911 Dependence on respirator [ventilator] status: Secondary | ICD-10-CM | POA: Diagnosis not present

## 2014-09-18 DIAGNOSIS — K668 Other specified disorders of peritoneum: Secondary | ICD-10-CM | POA: Diagnosis not present

## 2014-09-18 DIAGNOSIS — Z885 Allergy status to narcotic agent status: Secondary | ICD-10-CM

## 2014-09-18 LAB — URINALYSIS, ROUTINE W REFLEX MICROSCOPIC
BILIRUBIN URINE: NEGATIVE
Glucose, UA: NEGATIVE mg/dL
HGB URINE DIPSTICK: NEGATIVE
KETONES UR: NEGATIVE mg/dL
Leukocytes, UA: NEGATIVE
Nitrite: NEGATIVE
Protein, ur: NEGATIVE mg/dL
SPECIFIC GRAVITY, URINE: 1.018 (ref 1.005–1.030)
UROBILINOGEN UA: 1 mg/dL (ref 0.0–1.0)
pH: 7.5 (ref 5.0–8.0)

## 2014-09-18 LAB — BLOOD GAS, ARTERIAL
ACID-BASE EXCESS: 13 mmol/L — AB (ref 0.0–2.0)
Bicarbonate: 38.4 mEq/L — ABNORMAL HIGH (ref 20.0–24.0)
Drawn by: 24513
FIO2: 40 %
O2 Saturation: 99.1 %
PEEP: 5 cmH2O
PO2 ART: 152 mmHg — AB (ref 80.0–100.0)
Patient temperature: 98.6
RATE: 18 resp/min
TCO2: 40.4 mmol/L (ref 0–100)
VT: 500 mL
pCO2 arterial: 63.8 mmHg (ref 35.0–45.0)
pH, Arterial: 7.397 (ref 7.350–7.450)

## 2014-09-18 LAB — COMPREHENSIVE METABOLIC PANEL
ALT: 84 U/L — ABNORMAL HIGH (ref 0–35)
ANION GAP: 10 (ref 5–15)
AST: 35 U/L (ref 0–37)
Albumin: 3.5 g/dL (ref 3.5–5.2)
Alkaline Phosphatase: 85 U/L (ref 39–117)
BUN: 34 mg/dL — AB (ref 6–23)
CO2: 36 meq/L — AB (ref 19–32)
CREATININE: 0.66 mg/dL (ref 0.50–1.10)
Calcium: 9 mg/dL (ref 8.4–10.5)
Chloride: 92 mEq/L — ABNORMAL LOW (ref 96–112)
GFR calc non Af Amer: >90 mL/min (ref >90)
GLUCOSE: 109 mg/dL — AB (ref 70–99)
Potassium: 4.2 mEq/L (ref 3.7–5.3)
Sodium: 138 mEq/L (ref 137–147)
TOTAL PROTEIN: 6.8 g/dL (ref 6.0–8.3)
Total Bilirubin: 0.7 mg/dL (ref 0.3–1.2)

## 2014-09-18 LAB — CBC
HCT: 31.4 % — ABNORMAL LOW (ref 36.0–46.0)
HEMOGLOBIN: 8.8 g/dL — AB (ref 12.0–15.0)
MCH: 25.4 pg — ABNORMAL LOW (ref 26.0–34.0)
MCHC: 28 g/dL — ABNORMAL LOW (ref 30.0–36.0)
MCV: 90.5 fL (ref 78.0–100.0)
PLATELETS: 226 10*3/uL (ref 150–400)
RBC: 3.47 MIL/uL — AB (ref 3.87–5.11)
RDW: 18.8 % — ABNORMAL HIGH (ref 11.5–15.5)
WBC: 11.6 10*3/uL — ABNORMAL HIGH (ref 4.0–10.5)

## 2014-09-18 LAB — MAGNESIUM: MAGNESIUM: 3.2 mg/dL — AB (ref 1.5–2.5)

## 2014-09-18 LAB — PROTIME-INR
INR: 1.06 (ref 0.00–1.49)
PROTHROMBIN TIME: 13.9 s (ref 11.6–15.2)

## 2014-09-18 LAB — MRSA PCR SCREENING: MRSA by PCR: NEGATIVE

## 2014-09-18 LAB — PHOSPHORUS: PHOSPHORUS: 3.5 mg/dL (ref 2.3–4.6)

## 2014-09-18 LAB — LACTIC ACID, PLASMA: Lactic Acid, Venous: 0.7 mmol/L (ref 0.5–2.2)

## 2014-09-18 MED ORDER — FAMOTIDINE IN NACL 20-0.9 MG/50ML-% IV SOLN
20.0000 mg | Freq: Two times a day (BID) | INTRAVENOUS | Status: DC
  Administered 2014-09-18 – 2014-09-19 (×2): 20 mg via INTRAVENOUS
  Filled 2014-09-18 (×3): qty 50

## 2014-09-18 MED ORDER — BUDESONIDE 0.5 MG/2ML IN SUSP
0.5000 mg | Freq: Two times a day (BID) | RESPIRATORY_TRACT | Status: DC
  Administered 2014-09-19: 0.5 mg via RESPIRATORY_TRACT
  Filled 2014-09-18 (×4): qty 2

## 2014-09-18 MED ORDER — LORAZEPAM 2 MG/ML IJ SOLN: 1.0000 mg | INTRAMUSCULAR | Status: DC | PRN

## 2014-09-18 MED ORDER — KCL IN DEXTROSE-NACL 10-5-0.45 MEQ/L-%-% IV SOLN
INTRAVENOUS | Status: DC
  Administered 2014-09-18: 22:00:00 via INTRAVENOUS
  Filled 2014-09-18 (×2): qty 1000

## 2014-09-18 MED ORDER — CETYLPYRIDINIUM CHLORIDE 0.05 % MT LIQD
7.0000 mL | Freq: Four times a day (QID) | OROMUCOSAL | Status: DC
  Administered 2014-09-19 (×4): 7 mL via OROMUCOSAL

## 2014-09-18 MED ORDER — FENTANYL CITRATE 0.05 MG/ML IJ SOLN
50.0000 ug | INTRAMUSCULAR | Status: DC | PRN
  Administered 2014-09-18: 50 ug via INTRAVENOUS

## 2014-09-18 MED ORDER — CHLORHEXIDINE GLUCONATE 0.12 % MT SOLN
15.0000 mL | Freq: Two times a day (BID) | OROMUCOSAL | Status: DC
  Administered 2014-09-18 – 2014-09-19 (×2): 15 mL via OROMUCOSAL
  Filled 2014-09-18 (×2): qty 15

## 2014-09-18 MED ORDER — ONDANSETRON HCL 4 MG/2ML IJ SOLN: 4.0000 mg | Freq: Four times a day (QID) | INTRAMUSCULAR | Status: DC | PRN

## 2014-09-18 MED ORDER — SODIUM CHLORIDE 0.9 % IV SOLN: 250.0000 mL | INTRAVENOUS | Status: DC | PRN

## 2014-09-18 MED ORDER — IPRATROPIUM-ALBUTEROL 0.5-2.5 (3) MG/3ML IN SOLN
3.0000 mL | Freq: Four times a day (QID) | RESPIRATORY_TRACT | Status: DC
  Administered 2014-09-18 – 2014-09-19 (×4): 3 mL via RESPIRATORY_TRACT
  Filled 2014-09-18 (×4): qty 3

## 2014-09-18 MED ORDER — ACETAMINOPHEN 650 MG RE SUPP: 650.0000 mg | RECTAL | Status: DC | PRN

## 2014-09-18 MED ORDER — METOPROLOL TARTRATE 1 MG/ML IV SOLN: 2.5000 mg | Freq: Four times a day (QID) | INTRAVENOUS | Status: DC | PRN

## 2014-09-18 MED ORDER — FENTANYL CITRATE 0.05 MG/ML IJ SOLN
INTRAMUSCULAR | Status: AC
  Filled 2014-09-18: qty 2

## 2014-09-18 NOTE — Consult Note (Signed)
Reason for Consult:free air Referring Physician: Dr Dennard NipYacoub  Elizabeth [name] is an 58 y.o. female.  HPI: 58 yo AAM with severe COPD, chronic respiratory failure who had prolonged course at [facility] (was hospitalized 9/23-10/22) and had IR g tube placed approximately 1 month ago and was transferred to [facility] for vent mgmt and weaning. Pt had slowly been progressing at [facility]. Pt had ENT evaluation and was found to be ok with oral intake and had been eating some and the g tube had been not being used. She has recently completed course of abx for HCAP there. She had duplex of LE and UE and no DVT found. Apparently over the past several days she has had progressive abdominal distension. Plain film showed stool burden so enemas ordered. She had some results but still had ongoing distension. Plain film again done which was unchanged so NG placed and more enemas given. She also had some confusion on Friday and her tx to SNF was canceled bc of her confusion and constipation. Her WBC has been decreasing and was down to 14 yesterday. She no labs drawn today. O/w her labs were unremarkable (cbc 11/21 showed wbc 14, hgb 9.4, hct 30.4, plt 214; BMET wnl; albumin on 11/17 was 3.9 and nml lfts). A contrasted CT was done today which showed large amount of free air but unclear source and request was made for transfer for further evaluation since [facility] had no surgical coverage today.  Pt nods her head that her belly is sore. Denies n/v. Some flatus. History is a little challenging b/c of her trach.   Past Medical History  Diagnosis Date  . COPD (chronic obstructive pulmonary disease)   . Hypertension   . Acute respiratory failure   . GERD (gastroesophageal reflux disease)   . Hiatal hernia   . Hyperlipidemia   . Shortness of breath   . 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 Cholecystectomy    . Tracheostomy      feinstein    No  family history on file.  Social History:  reports that she quit smoking about 10 years ago. Her smoking use included Cigarettes. She has a 30 pack-year smoking history. She has never used smokeless tobacco. She reports that she does not drink alcohol or use illicit drugs.  Allergies:  Allergies  Allergen Reactions  . Bactrim Ds [Sulfamethoxazole-Trimethoprim] Hives  . Codeine Nausea And Vomiting  . Morphine Nausea And Vomiting  . Demerol [Meperidine] Other (See Comments)    Medications: I have reviewed the patient's current medications.  Results for orders placed or performed during the hospital encounter of 09/18/14 (from the past 48 hour(s))  Blood gas, arterial     Status: Abnormal   Collection Time: 09/18/14  8:43 PM  Result Value Ref Range   FIO2 40.00 %   Delivery systems VENTILATOR    Mode PRESSURE REGULATED VOLUME CONTROL    VT 500 mL   Rate 18.0 resp/min   Peep/cpap 5.0 cm H20   pH, Arterial 7.397 7.350 - 7.450   pCO2 arterial 63.8 (HH) 35.0 - 45.0 mmHg    Comment: CRITICAL RESULT CALLED TO, READ BACK BY AND VERIFIED WITH:  DR. Andrey CampanileWILSON AT 2047 BY FRANK MIKE JR RRT,RCP ON 11 22 2015    pO2, Arterial 152.0 (H) 80.0 - 100.0 mmHg   Bicarbonate 38.4 (H) 20.0 - 24.0 mEq/L   TCO2 40.4 0 - 100 mmol/L  Acid-Base Excess 13.0 (H) 0.0 - 2.0 mmol/L   O2 Saturation 99.1 %   Patient temperature 98.6    Collection site LEFT RADIAL    Drawn by [phone]    Sample type ARTERIAL    Allens test (pass/fail) PASS PASS    No results found.  Review of Systems  Unable to perform ROS: intubated   Blood pressure 97/79, pulse 118, temperature 98.4 F (36.9 C), temperature source Oral, resp. rate 20, height 5\' 7"  (1.702 m), weight 130 lb 4.7 oz (59.1 kg), SpO2 99 %. Physical Exam  Vitals reviewed. Constitutional: She appears well-developed. She is cooperative. No distress. Cervical collar and nasal cannula in place.  AAF in NAD, nontoxic; using yanker suction intermittently  HENT:   Head: Normocephalic and atraumatic. Head is without raccoon's eyes, without Battle's sign, without abrasion, without contusion and without laceration.  Right Ear: Hearing, tympanic membrane, external ear and ear canal normal. No lacerations. No drainage or tenderness. No foreign bodies. Tympanic membrane is not perforated. No hemotympanum.  Left Ear: Hearing, tympanic membrane, external ear and ear canal normal. No lacerations. No drainage or tenderness. No foreign bodies. Tympanic membrane is not perforated. No hemotympanum.  Nose: Nose normal. No nose lacerations, sinus tenderness, nasal deformity or nasal septal hematoma. No epistaxis.  Mouth/Throat: Uvula is midline, oropharynx is clear and moist and mucous membranes are normal. No lacerations.  Some temporal wasting  Eyes: Conjunctivae, EOM and lids are normal. Pupils are equal, round, and reactive to light. No scleral icterus.  Neck: Trachea normal. No JVD present. No spinous process tenderness and no muscular tenderness present. Carotid bruit is not present. No tracheal deviation present. No thyromegaly present.  tracheostomy  Cardiovascular: Regular rhythm, normal heart sounds, intact distal pulses and normal pulses.  Tachycardia present.   Respiratory: Effort normal and breath sounds normal. No respiratory distress. She exhibits no tenderness, no bony tenderness, no laceration and no crepitus.  GI: Soft. Normal appearance. She exhibits no distension. Bowel sounds are decreased. There is no tenderness. There is no rigidity, no rebound, no guarding and no CVA tenderness.  very distended abdomen; a little firm but not rigid; no peritonitis, no guarding; scattered bruises. +BS; +LUQ G tube - insertion site looks ok. About 2mm circumferential erythema  Musculoskeletal: Normal range of motion. She exhibits no edema or tenderness.  Lymphadenopathy:    She has no cervical adenopathy.  Neurological: She is alert. She has normal strength. No cranial  nerve deficit or sensory deficit. GCS eye subscore is 4. GCS verbal subscore is 5. GCS motor subscore is 6.  Awake, alert, mouths responses to questions. Follows commands. MAE  Skin: Skin is warm, dry and intact. She is not diaphoretic.  Psychiatric: She has a normal mood and affect. Her speech is normal and behavior is normal.    Assessment/Plan: Large amount of pneumoperitoneum S/p IR placed G tube 1 month ago Recent txt for HCAP Severe COPD Acute on chronic respiratory failure, vent dependent HTN Anxiety Hiatal hernia H/o PAF  I reviewed her outside records, her [facility] discharge summary, and CT from today with radiology She has chronic lung changes c/w emphysema, very large amount of free air taking up anterior abdomen, she also has some free air along her L psosas muscle in pelvis that appears extraperitoneal, there is no evidence of contrast extravasation. There is no evidence of bowel wall thickening.   I do not believe the source of her free air is from intestinal perforation. She is not  ill appearing like a bowel perf pt would be. And given the volume of free air I believe the source is likely her lungs and possibly air entering around G tube (less likely).   Her admission labs are pending. If her pneumoperitoneum impairs ventilation or bladder function then will entertain decompressive tap at bedside vs OR.   Npo for now g tube to gravity Place foley  Mary SellaEric M. Andrey CampanileWilson, MD, FACS General, Bariatric, & Minimally Invasive Surgery [facility], [place]   Anthony M Yelencsics CommunityWILSON,Jocilyn Trego M 09/18/2014, 9:03 PM

## 2014-09-18 NOTE — H&P (Addendum)
PULMONARY / CRITICAL CARE MEDICINE   Name: Elizabeth [name] MRN: [id number] DOB: 03-Nov-1955    ADMISSION DATE:  09/18/2014  REFERRING MD :  From [facility]  CHIEF COMPLAINT:  Abdominal distention  INITIAL PRESENTATION:   STUDIES:  CT abdomen 09/18/14: large amount of free air in the abdomen  SIGNIFICANT EVENTS: CT abd 09/18/14: large amount of free intraperitoneal air without a definite source  HISTORY OF PRESENT ILLNESS:  This is a chronically ill 46F with COPD s/p trache and vent dependent, HTN, GERD, paroxysmal AFib, anxiety, HLD, dysphagia s/p G-tube, chronic constipation/ileus, and significant deconditioning. Apparently patient had a prolonged hospitalization for a month and was discharged to [facility] last month, During that hospitalization she was trached due to inability to wean of the ventilator d/t COPD. Also got G-tube insertion due to dysphagia. Since her stay in [facility], she noticed that her abdomen was slowly enlarging and becoming more uncomfortable especially with direct pressure. She has been fed through both the G-tube and by mouth (she passed swallow eval there). She has been having obstipation, cannot remember when the last time she had bowel movement or even gas. Denies N/V. She got abdominal xray in [facility] showing abd distension and given several bouts of enema. Eventually a CT abdomen was done showing a large amount of free air in the abdomen thus she was transferred to [facility] ICU. She is still on vent, denies acute dyspnea, chest pain, or productive cough.  PAST MEDICAL HISTORY :   has a past medical history of COPD (chronic obstructive pulmonary disease); Hypertension; Acute respiratory failure; GERD (gastroesophageal reflux disease); Hiatal hernia; Hyperlipidemia; Shortness of breath; and Respiratory disorder with ventilator dependence (06/2014).  has past surgical history that includes Cholecystectomy and Tracheostomy. Prior to Admission medications   Medication Sig  Start Date End Date Taking? Authorizing Provider  acetaminophen (TYLENOL) 160 MG/5ML solution Place 20.3 mLs (650 mg total) into feeding tube every 6 (six) hours as needed for mild pain or fever. 08/18/14   Jeanella CrazeBrandi L Ollis, NP  antiseptic oral rinse (CPC / CETYLPYRIDINIUM CHLORIDE 0.05%) 0.05 % LIQD solution 7 mLs by Mouth Rinse route QID. 08/18/14   Jeanella CrazeBrandi L Ollis, NP  budesonide (PULMICORT) 0.5 MG/2ML nebulizer solution Take 2 mLs (0.5 mg total) by nebulization 2 (two) times daily. 06/18/14   Bernadene PersonKathryn A Whiteheart, NP  chlorhexidine (PERIDEX) 0.12 % solution 15 mLs by Mouth Rinse route 2 (two) times daily. 08/18/14   Jeanella CrazeBrandi L Ollis, NP  clonazePAM (KLONOPIN) 1 MG tablet Place 1 tablet (1 mg total) into feeding tube 2 (two) times daily. 08/18/14   Jeanella CrazeBrandi L Ollis, NP  enoxaparin (LOVENOX) 40 MG/0.4ML injection Inject 0.4 mLs (40 mg total) into the skin daily. 08/19/14   Jeanella CrazeBrandi L Ollis, NP  famotidine (PEPCID) 40 MG/5ML suspension Place 2.5 mLs (20 mg total) into feeding tube at bedtime. 08/18/14   Jeanella CrazeBrandi L Ollis, NP  fentaNYL (DURAGESIC - DOSED MCG/HR) 50 MCG/HR Place 1 patch (50 mcg total) onto the skin every 3 (three) days. 08/18/14   Jeanella CrazeBrandi L Ollis, NP  ferrous sulfate 300 (60 FE) MG/5ML syrup Place 5 mLs (300 mg total) into feeding tube every 12 (twelve) hours. 08/18/14   Jeanella CrazeBrandi L Ollis, NP  guaiFENesin (ROBITUSSIN) 100 MG/5ML SOLN Take 30 mLs (600 mg total) by mouth 4 (four) times daily. 08/18/14   Jeanella CrazeBrandi L Ollis, NP  ipratropium-albuterol (DUONEB) 0.5-2.5 (3) MG/3ML SOLN Take 3 mLs by nebulization 4 (four) times daily. 08/18/14   Jeanella CrazeBrandi L Ollis,  NP  LORazepam (ATIVAN) 2 MG/ML injection Inject 0.5 mLs (1 mg total) into the vein every 4 (four) hours as needed for anxiety. 08/18/14   Jeanella Craze, NP  metoCLOPramide (REGLAN) 5 MG/ML injection Inject 1 mL (5 mg total) into the vein every 12 (twelve) hours. 08/18/14 08/20/14  Jeanella Craze, NP  metoprolol tartrate (LOPRESSOR) 25 MG tablet Take 12.5 mg  by mouth 2 (two) times daily.     Historical Provider, MD  Nutritional Supplements (FEEDING SUPPLEMENT, VITAL AF 1.2 CAL,) LIQD Place 1,000 mLs into feeding tube continuous. Per Nutrition recommendations.  Ok to restart TF on 10/24 via PEG 08/20/14   Jeanella Craze, NP  sodium chloride 0.9 % infusion Inject 250 mLs into the vein as needed (if IV carrier fluid needed.). 08/18/14   Jeanella Craze, NP  Water For Irrigation, Sterile (FREE WATER) SOLN Place 200 mLs into feeding tube every 8 (eight) hours. 08/18/14   Jeanella Craze, NP   Allergies  Allergen Reactions  . Bactrim Ds [Sulfamethoxazole-Trimethoprim] Hives  . Codeine Nausea And Vomiting  . Morphine Nausea And Vomiting  . Demerol [Meperidine] Other (See Comments)    FAMILY HISTORY:  has no family status information on file.  SOCIAL HISTORY:  reports that she quit smoking about 10 years ago. Her smoking use included Cigarettes. She has a 30 pack-year smoking history. She has never used smokeless tobacco. She reports that she does not drink alcohol or use illicit drugs.  REVIEW OF SYSTEMS:  Has some ecchymoses where she got her DVT prophylaxis shots. Has generalized chronic weakness. No other rash. No leg edema, no back pain, no chest pain, no focal weakness, no numbness, no vision changes, no HA, no fever, no chills, no sweats.  SUBJECTIVE: denies acute pain or dyspnea  VITAL SIGNS: Temp:  [98.4 F (36.9 C)] 98.4 F (36.9 C) (11/22 1904) Pulse Rate:  [118-122] 118 (11/22 2000) Resp:  [18-20] 20 (11/22 2015) BP: (97-111)/(75-79) 97/79 mmHg (11/22 2015) SpO2:  [99 %-100 %] 99 % (11/22 2015) FiO2 (%):  [40 %] 40 % (11/22 2015) Weight:  [59.1 kg (130 lb 4.7 oz)] 59.1 kg (130 lb 4.7 oz) (11/22 1900) HEMODYNAMICS:   VENTILATOR SETTINGS: Vent Mode:  [-] PRVC FiO2 (%):  [40 %] 40 % Set Rate:  [18 bmp] 18 bmp Vt Set:  [500 mL] 500 mL PEEP:  [5 cmH20] 5 cmH20 Plateau Pressure:  [41 cmH20] 41 cmH20 INTAKE / OUTPUT: No intake or  output data in the 24 hours ending 09/18/14 2024  PHYSICAL EXAMINATION: General:  Appears comfortable at rest, somewhat malnourished Neuro:  No focal weakness, EOM full and equal, pupils equal and reactive HEENT:  Atraumatic, trache site appears clean, no bleeding Cardiovascular:  RRR, no loud murmur Lungs:  Decreased breath sounds, no rales, no wheeze Abdomen:  Severely distended, mild direct tenderness, no guarding, hypoactive bowel sounds Musculoskeletal:  No deformities, no joint effusion Skin:  Several abdominal ecchymosis, no other rash  LABS:  CBC No results for input(s): WBC, HGB, HCT, PLT in the last 168 hours. Coag's No results for input(s): APTT, INR in the last 168 hours. BMET No results for input(s): NA, K, CL, CO2, BUN, CREATININE, GLUCOSE in the last 168 hours. Electrolytes No results for input(s): CALCIUM, MG, PHOS in the last 168 hours. Sepsis Markers No results for input(s): LATICACIDVEN, PROCALCITON, O2SATVEN in the last 168 hours. ABG No results for input(s): PHART, PCO2ART, PO2ART in the last 168 hours. Liver Enzymes  No results for input(s): AST, ALT, ALKPHOS, BILITOT, ALBUMIN in the last 168 hours. Cardiac Enzymes No results for input(s): TROPONINI, PROBNP in the last 168 hours. Glucose No results for input(s): GLUCAP in the last 168 hours.  Imaging No results found.   ASSESSMENT / PLAN:  PULMONARY Trached patient A: COPD and prior pneumothorax s/p trache and vent dependent P:   Vent support and wean as tolerated. Check ABG, goal pH around 7.35. VAP bundle. If patient becomes significantly hypoxic then she may need increased PEEP based on her severe abdominal distension  CARDIOVASCULAR A: Paroxysmal AFib, HTN P:  Lopressor PRN for now until patient able to take meds enterally May have some decreased venous return due to abdominal distension but so far clinically holding stable  RENAL A:  No acute issues P:   Monitor renal function and  electrolytes, avoid nephrotoxic meds Fluid maintenance d5-1/2NS + 10 KCl until patient able to take enteral feeding  GASTROINTESTINAL A:  S/p G-tube now with severe abdominal distension d/y intraabdominal free air P:   Surgery consult, keep NPO for now. G-tube to gravity. No definite signs of abdominal compartment syndrome for now but will monitor UO and cardiovascular status  HEMATOLOGIC A:  Chronic anemia likely d/t chronic disease P:  Goal Hb 7. Monitor Hb  INFECTIOUS A:  No acute issues P:   BCx2  UC  Sputum Abx:  ENDOCRINE A:  No acute issue P:   Fingerstick check, goal blood sugar 140-180  NEUROLOGIC A:  No acute issue P:   Monitor   FAMILY  - Updates:   - Inter-disciplinary family meet or Palliative Care meeting due by:  TODAY'S SUMMARY: Admitted to ICU, kept on vent, surgery consult called. Kept NPO  Critical care time: 35 minutes  Pulmonary and Critical Care Medicine [facility] Pager: [phone]  09/18/2014, 8:24 PM

## 2014-09-18 NOTE — Plan of Care (Signed)
Problem: Consults Goal: Respiratory Therapy Consult Outcome: Completed/Met Date Met:  09/18/14

## 2014-09-19 ENCOUNTER — Inpatient Hospital Stay (HOSPITAL_COMMUNITY): Payer: BC Managed Care – PPO

## 2014-09-19 DIAGNOSIS — J9610 Chronic respiratory failure, unspecified whether with hypoxia or hypercapnia: Secondary | ICD-10-CM

## 2014-09-19 DIAGNOSIS — K668 Other specified disorders of peritoneum: Secondary | ICD-10-CM

## 2014-09-19 LAB — BASIC METABOLIC PANEL
Anion gap: 12 (ref 5–15)
BUN: 37 mg/dL — ABNORMAL HIGH (ref 6–23)
CALCIUM: 8.4 mg/dL (ref 8.4–10.5)
CO2: 33 mEq/L — ABNORMAL HIGH (ref 19–32)
Chloride: 95 mEq/L — ABNORMAL LOW (ref 96–112)
Creatinine, Ser: 0.79 mg/dL (ref 0.50–1.10)
Glucose, Bld: 107 mg/dL — ABNORMAL HIGH (ref 70–99)
Potassium: 4 mEq/L (ref 3.7–5.3)
SODIUM: 140 meq/L (ref 137–147)

## 2014-09-19 LAB — CBC
HCT: 27.6 % — ABNORMAL LOW (ref 36.0–46.0)
HEMOGLOBIN: 8.1 g/dL — AB (ref 12.0–15.0)
MCH: 26.7 pg (ref 26.0–34.0)
MCHC: 29.3 g/dL — AB (ref 30.0–36.0)
MCV: 91.1 fL (ref 78.0–100.0)
PLATELETS: 213 10*3/uL (ref 150–400)
RBC: 3.03 MIL/uL — ABNORMAL LOW (ref 3.87–5.11)
RDW: 18.9 % — AB (ref 11.5–15.5)
WBC: 12.2 10*3/uL — ABNORMAL HIGH (ref 4.0–10.5)

## 2014-09-19 LAB — GLUCOSE, CAPILLARY
Glucose-Capillary: 127 mg/dL — ABNORMAL HIGH (ref 70–99)
Glucose-Capillary: 130 mg/dL — ABNORMAL HIGH (ref 70–99)
Glucose-Capillary: 143 mg/dL — ABNORMAL HIGH (ref 70–99)

## 2014-09-19 LAB — PHOSPHORUS: Phosphorus: 2 mg/dL — ABNORMAL LOW (ref 2.3–4.6)

## 2014-09-19 LAB — MAGNESIUM: MAGNESIUM: 3.1 mg/dL — AB (ref 1.5–2.5)

## 2014-09-19 MED ORDER — FREE WATER
200.0000 mL | Freq: Three times a day (TID) | Status: DC
  Administered 2014-09-19: 200 mL

## 2014-09-19 MED ORDER — LIDOCAINE HCL (PF) 1 % IJ SOLN
INTRAMUSCULAR | Status: AC
  Administered 2014-09-19: 01:00:00
  Filled 2014-09-19: qty 5

## 2014-09-19 MED ORDER — SODIUM CHLORIDE 0.9 % IV BOLUS (SEPSIS)
500.0000 mL | Freq: Once | INTRAVENOUS | Status: AC
  Administered 2014-09-19: 500 mL via INTRAVENOUS

## 2014-09-19 MED ORDER — CLONAZEPAM 1 MG PO TABS: 1.0000 mg | ORAL_TABLET | Freq: Two times a day (BID) | ORAL | Status: AC | PRN

## 2014-09-19 MED ORDER — DEXTROSE 5 % IV SOLN
30.0000 mmol | Freq: Once | INTRAVENOUS | Status: AC
  Administered 2014-09-19: 30 mmol via INTRAVENOUS
  Filled 2014-09-19: qty 10

## 2014-09-19 NOTE — Progress Notes (Signed)
Elizabeth [name] [id number] [birth year] 09/19/2014  Procedure note - Percutaneous needle decompression of Abdominal Compartment Syndrome  Surgeon: Atilano InaEric M Kaylor Simenson MD FACS  Indication - [phone] AAF with vent depend resp failure with massive pneumoperitoneum likely from lungs with findings c/w abdominal compartment syndrome (peak airway pressures 40-50s, oligouria, hypotension, intra-abdominal bladder pressure 17).  On ct there did not appear to be a intestinal source of free air. rec bedside needle decompression to pt. Discussed risk/benefits including but not limited bleeding, injury to surrounding structures, infection, need for surgery if found to have intestinal perforation, need for addl procedures. Pt gave verbal consent  Procedure: pt placed supine. Starting peak airway pressure mid40s. Periumbilical area prepped with chloraprep. 2 cc of 1% lidocaine infiltrated at umbilical skin. I first tried a 16 gauge angiocath just below umbilicus. Easily entered abdomen and got some air but lumen of angiocath too narrow. When I withdrew, air escaped thru puncture site. I tried a 14 gauge angiocath and advanced about 2cm thru skin - got continuous rush of air, large amount of air release with visible decompression of abdomen. angiocath removed and sterile dressing applied. Pt tolerated well. No complications. Airway pressure in mid 30s.   Will check post procedure bladder pressure  Mary SellaEric M. Andrey CampanileWilson, MD, FACS General, Bariatric, & Minimally Invasive Surgery [facility], [place]

## 2014-09-19 NOTE — Progress Notes (Signed)
Patient to be [facility].   Report called to Elon Alashristina Hill, RN.  Husband notified of transport to [facility].  [facility] transporting at this time.  Baldemar LenisLuke Harmon of [facility] given appropriate paperwork for transport, will continue to monitor.

## 2014-09-19 NOTE — Progress Notes (Signed)
In to turn pt. Pt turned to left and she motioned that she wanted to write something. As pt was writing her eyes rolled back in her head and she became unresponsive. Pts HR that had been 110-120's started to drop to the 60's. Pt suctioned via trach, minimal secretions suctioned out. Pt bagged for several breaths and then suctioned out again via trach. Moderate amount of tan colored sections suctioned out. Pts heart rate returned to 120-130's. Pt became alert and responding to verbal stimuli. After event, pts lungs ausculted and lung sounds "junky" and diminished. Pola CornELINK MD notified. Will continue to monitor closely.

## 2014-09-19 NOTE — Discharge Summary (Signed)
Physician Discharge Summary  Patient ID: Elizabeth [name] MRN: [id number] DOB/AGE: 1955-11-11 58 y.o.  Admit date: 09/18/2014 Discharge date: 09/19/2014    Discharge Diagnoses:  Active Problems:   Peritoneal cavity free air    Brief Summary: TEDDIE CURD is a 58 y.o. y/o female with a PMH of COPD s/p trach, vent dependent, HTN, GERD, Afib, dysphagia s/p IR g-tube approx 1 month ago, and significant deconditioning. Has been at [facility] r/t vent dependence.  Was having increased constipation, abd distention and mild leukocytosis.  Workup included abd xray and CT abd were concerning for free air.  She was tx to [facility] 11/22 for surgical eval of massive pneumoperitoneum, PCCM asked to admit r/t vent status.  She was seen in consultation by surgery who did not feel that the free air is from intestinal perforation but was concerned about abd compartment syndrome.  Dr. Redmond Pulling did perform percutaneous needle decompression at bedside with large amount of air release with visible decompression of the abd. No ptx on CXR.  Tol previous vent settings, hemodynamically stable.  Pt has been stable with improving leukocytosis, abd exam essentially benign, tolerated needle decompression without complications.  Stable per surgery for tx back to [facility], no further surgical intervention needed.     Consults: Surgery - [place]   Lines/tubes: Trach>>chronic   Microbiology/Sepsis markers: None   Significant Diagnostic Studies:  None                                                                    Discharge plan by Discharge Diagnosis    COPD and prior pneumothorax s/p trache and vent dependent P:  Vent support and wean as tolerated.  If patient becomes significantly hypoxic then she may need increased PEEP based on her severe abdominal distension Cont nebs as previous -- Pulmicort, duonebs  Paroxysmal AFib, HTN P:  Cont lopressor   Hypophosphatemia  Hypernatremia - mild  D/c plan --   Will replete prior to d/c  F/u chem  Cont free water   S/p G-tube now with severe abdominal distension d/t pneumoperitoneum - s/p needle decompression  ?abd compartment syndrome - resolved.  P:  Monitor uop Further surgical eval at [facility] if indicated  No indication surgical repair  No evidence peritonitis  Cont feeds PO as previous and via g-tube as needed   Chronic anemia likely d/t chronic disease P:  F/u cbc      Filed Vitals:   09/19/14 0800 09/19/14 0830 09/19/14 0900 09/19/14 1000  BP: 81/56  67/49 74/52  Pulse: 115  106 95  Temp:  98.9 F (37.2 C)    TempSrc:  Oral    Resp: 18  18 21   Height:      Weight:      SpO2: 100%  100% 100%     Discharge Labs  BMET  Recent Labs Lab 09/18/14 2106 09/19/14 0254  NA 138 140  K 4.2 4.0  CL 92* 95*  CO2 36* 33*  GLUCOSE 109* 107*  BUN 34* 37*  CREATININE 0.66 0.79  CALCIUM 9.0 8.4  MG 3.2* 3.1*  PHOS 3.5 2.0*     CBC   Recent Labs Lab 09/18/14 2106 09/19/14 0254  HGB 8.8* 8.1*  HCT 31.4* 27.6*  WBC 11.6* 12.2*  PLT 226 213   Anti-Coagulation  Recent Labs Lab 09/18/14 2106  INR 1.06             Follow-up Information    Follow up with [facility], MD.   Specialty:  Internal Medicine   Why:  post d/c from [facility]    Contact information:   [place] [address] 109 [phone]          Medication List    STOP taking these medications        fentaNYL 50 MCG/HR  Commonly known as:  DURAGESIC - dosed mcg/hr     metoCLOPramide 5 MG/ML injection  Commonly known as:  REGLAN      TAKE these medications        acetaminophen 160 MG/5ML solution  Commonly known as:  TYLENOL  Place 20.3 mLs (650 mg total) into feeding tube every 6 (six) hours as needed for mild pain or fever.     antiseptic oral rinse 0.05 % Liqd solution  Commonly known as:  CPC / CETYLPYRIDINIUM CHLORIDE 0.05%  7 mLs by Mouth Rinse route QID.     budesonide 0.5 MG/2ML nebulizer  solution  Commonly known as:  PULMICORT  Take 2 mLs (0.5 mg total) by nebulization 2 (two) times daily.     chlorhexidine 0.12 % solution  Commonly known as:  PERIDEX  15 mLs by Mouth Rinse route 2 (two) times daily.     clonazePAM 1 MG tablet  Commonly known as:  KLONOPIN  Place 1 tablet (1 mg total) into feeding tube 2 (two) times daily as needed for anxiety.     enoxaparin 40 MG/0.4ML injection  Commonly known as:  LOVENOX  Inject 0.4 mLs (40 mg total) into the skin daily.     famotidine 40 MG/5ML suspension  Commonly known as:  PEPCID  Place 2.5 mLs (20 mg total) into feeding tube at bedtime.     feeding supplement (VITAL AF 1.2 CAL) Liqd  Place 1,000 mLs into feeding tube continuous. Per Nutrition recommendations.  Ok to restart TF on 10/24 via PEG     ferrous sulfate 300 (60 FE) MG/5ML syrup  Place 5 mLs (300 mg total) into feeding tube every 12 (twelve) hours.     free water Soln  Place 200 mLs into feeding tube every 8 (eight) hours.     guaiFENesin 100 MG/5ML Soln  Commonly known as:  ROBITUSSIN  Take 30 mLs (600 mg total) by mouth 4 (four) times daily.     ipratropium-albuterol 0.5-2.5 (3) MG/3ML Soln  Commonly known as:  DUONEB  Take 3 mLs by nebulization 4 (four) times daily.     LORazepam 2 MG/ML injection  Commonly known as:  ATIVAN  Inject 0.5 mLs (1 mg total) into the vein every 4 (four) hours as needed for anxiety.     metoprolol tartrate 25 MG tablet  Commonly known as:  LOPRESSOR  Take 12.5 mg by mouth 2 (two) times daily.     sodium chloride 0.9 % infusion  Inject 250 mLs into the vein as needed (if IV carrier fluid needed.).          Disposition: 02-Short Term Hospital  Discharged Condition: CORAL TIMME has met maximum benefit of inpatient care and is medically stable and cleared for discharge.  Patient is pending follow up as above.      Time spent on disposition:  Greater than 35 minutes.   Signed:  Darlina Sicilian,  NP 09/19/2014  10:20 AM Pager: [phone] or [phone]  Pt seen, examined and database reviewed Agree with above   Merton Border, MD ; [facility] service Mobile [phone].  After 5:30 PM or weekends, call [phone]

## 2014-09-19 NOTE — Progress Notes (Signed)
Patient ID: Elizabeth [name], female   DOB: Jul 14, 1956, 58 y.o.   MRN: [id number]    Subjective: Pt feels well today.  No abdominal pain.  Asking to go back to [facility]  Objective: Vital signs in last 24 hours: Temp:  [98.4 F (36.9 C)-99 F (37.2 C)] 98.9 F (37.2 C) (11/23 0830) Pulse Rate:  [88-130] 106 (11/23 0900) Resp:  [14-25] 18 (11/23 0900) BP: (61-111)/(45-79) 67/49 mmHg (11/23 0900) SpO2:  [98 %-100 %] 100 % (11/23 0900) FiO2 (%):  [40 %] 40 % (11/23 0800) Weight:  [130 lb 4.7 oz (59.1 kg)-130 lb 15.3 oz (59.4 kg)] 130 lb 15.3 oz (59.4 kg) (11/23 0442) Last BM Date: 09/17/14  Intake/Output from previous day: 11/22 0701 - 11/23 0700 In: 1020 [I.V.:470; IV Piggyback:550] Out: 315 [Urine:315] Intake/Output this shift: Total I/O In: 100 [I.V.:100] Out: 10 [Urine:10]  PE: Abd: soft, still with some distention, +BS, g-tube in place with enteric output, nontender to palpation  Lab Results:   Recent Labs  09/18/14 2106 09/19/14 0254  WBC 11.6* 12.2*  HGB 8.8* 8.1*  HCT 31.4* 27.6*  PLT 226 213   BMET  Recent Labs  09/18/14 2106 09/19/14 0254  NA 138 140  K 4.2 4.0  CL 92* 95*  CO2 36* 33*  GLUCOSE 109* 107*  BUN 34* 37*  CREATININE 0.66 0.79  CALCIUM 9.0 8.4   PT/INR  Recent Labs  09/18/14 2106  LABPROT 13.9  INR 1.06   CMP     Component Value Date/Time   NA 140 09/19/2014 0254   K 4.0 09/19/2014 0254   CL 95* 09/19/2014 0254   CO2 33* 09/19/2014 0254   GLUCOSE 107* 09/19/2014 0254   BUN 37* 09/19/2014 0254   CREATININE 0.79 09/19/2014 0254   CALCIUM 8.4 09/19/2014 0254   PROT 6.8 09/18/2014 2106   ALBUMIN 3.5 09/18/2014 2106   AST 35 09/18/2014 2106   ALT 84* 09/18/2014 2106   ALKPHOS 85 09/18/2014 2106   BILITOT 0.7 09/18/2014 2106   GFRNONAA >90 09/19/2014 0254   GFRAA >90 09/19/2014 0254   Lipase     Component Value Date/Time   LIPASE 33 06/11/2014 2020       Studies/Results: Dg Chest Port 1 View  09/19/2014    CLINICAL DATA:  COPD with a tracheostomy tube on ventilation.  EXAM: PORTABLE CHEST - 1 VIEW  COMPARISON:  09/18/2014 and 08/16/2014  FINDINGS: PICC line tip in the SVC region. Tracheostomy tube is appropriately positioned. Again noted is marked hyperinflation consistent with emphysema. Negative for a pneumothorax. Evidence for a gastrostomy tube. Curvilinear density at the left lung base could be related to the lung or possibly related to the stomach. This finding is unchanged from 08/16/2014. Probable pleural scarring at the lung bases. No focal airspace disease. Heart size is small but unchanged.  IMPRESSION: Emphysematous changes with a tracheostomy tube and gastrostomy tube.  No acute chest findings.   Electronically Signed   By: Richarda OverlieAdam  Henn M.D.   On: 09/19/2014 07:42   Dg Chest Port 1 View  09/18/2014   CLINICAL DATA:  Shortness of breath  EXAM: PORTABLE CHEST - 1 VIEW  COMPARISON:  08/16/2014  FINDINGS: The tracheostomy tube tip is above the carina. There is a a right arm PICC line with tip in the SVC. Normal heart size. No pleural effusion or edema. Lungs are hyperinflated and there are coarsened interstitial markings.  IMPRESSION: 1. No acute cardiopulmonary abnormalities. 2. Advanced COPD/emphysema  Electronically Signed   By: Signa Kellaylor  Stroud M.D.   On: 09/18/2014 22:18    Anti-infectives: Anti-infectives    None       Assessment/Plan  1. Pneumoperitoneum, unknown etiology, s/p needle decompression 2. Tracheostomy status 3. Hypotension  Plan: 1. Patient doing well today with no further abdominal pain.  No peritonitis or indications for surgical intervention.  She is stable for transfer back to [facility] from our standpoint.   LOS: 1 day    Nikyah Lackman E 09/19/2014, 9:28 AM Pager: [phone]

## 2014-09-19 NOTE — Progress Notes (Signed)
eLink Physician-Brief Progress Note Patient Name: Elizabeth [name] DOB: [birth year] MRN: [id number]   Date of Service  09/19/2014  HPI/Events of Note  Hypotensive, asleep, comfortable otherwise S/p peritoneal decompression  eICU Interventions  Bolus 500cc saline Careful monitoring     Intervention Category Major Interventions: Hypotension - evaluation and management  Jabes Primo 09/19/2014, 1:39 AM

## 2014-09-19 NOTE — Clinical Social Work Note (Signed)
Clinical Social Worker has assessed patient. [facility]. Full psychosocial to follow.   Derenda FennelBashira Bensyn Bornemann, MSW, LCSWA [phone] 09/19/2014 4:23 PM

## 2014-09-20 LAB — GLUCOSE, CAPILLARY: Glucose-Capillary: 135 mg/dL — ABNORMAL HIGH (ref 70–99)

## 2015-01-27 DEATH — deceased

## 2015-05-26 IMAGING — CR DG CHEST 1V PORT
2 series · 2 of 2 positions shown · non-contrast
Comparison: 10/06/2014 and 10/07/2014

CLINICAL DATA: Respiratory failure.

EXAM:
PORTABLE CHEST - 1 VIEW

[AP (1 of 2)]
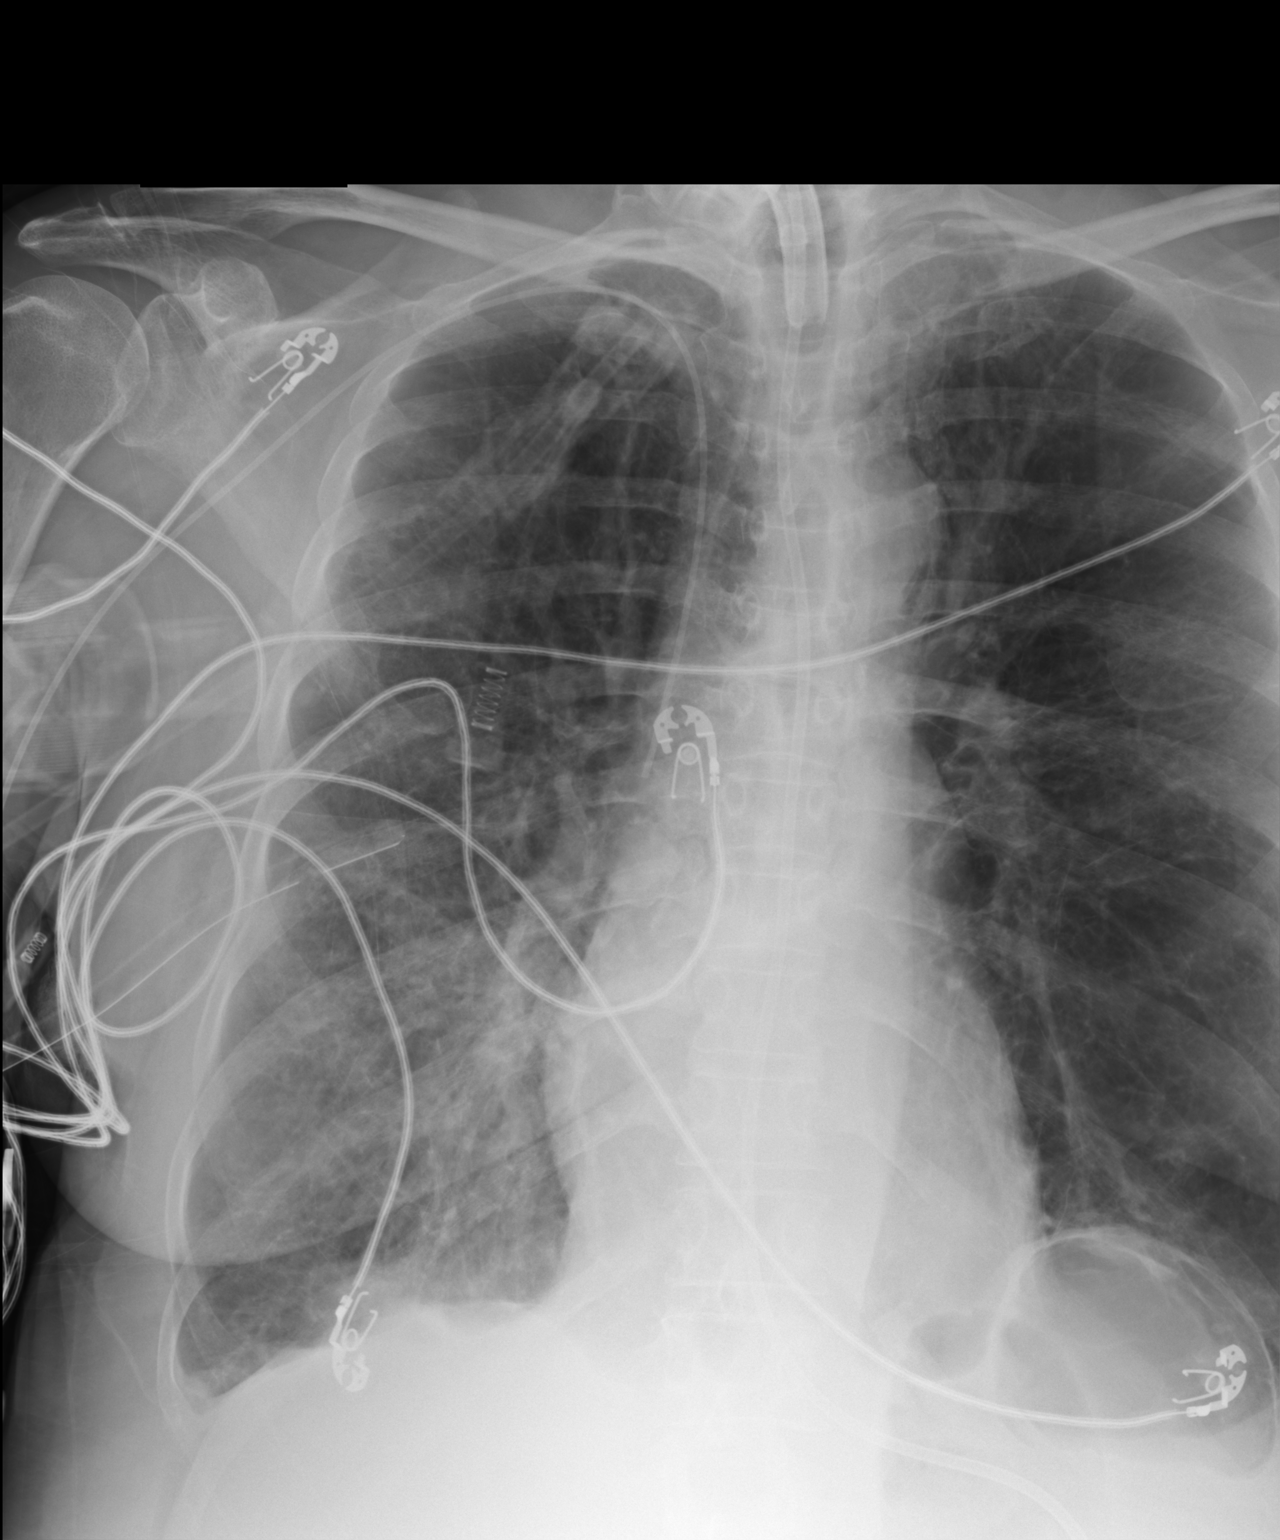

[AP (2 of 2)]
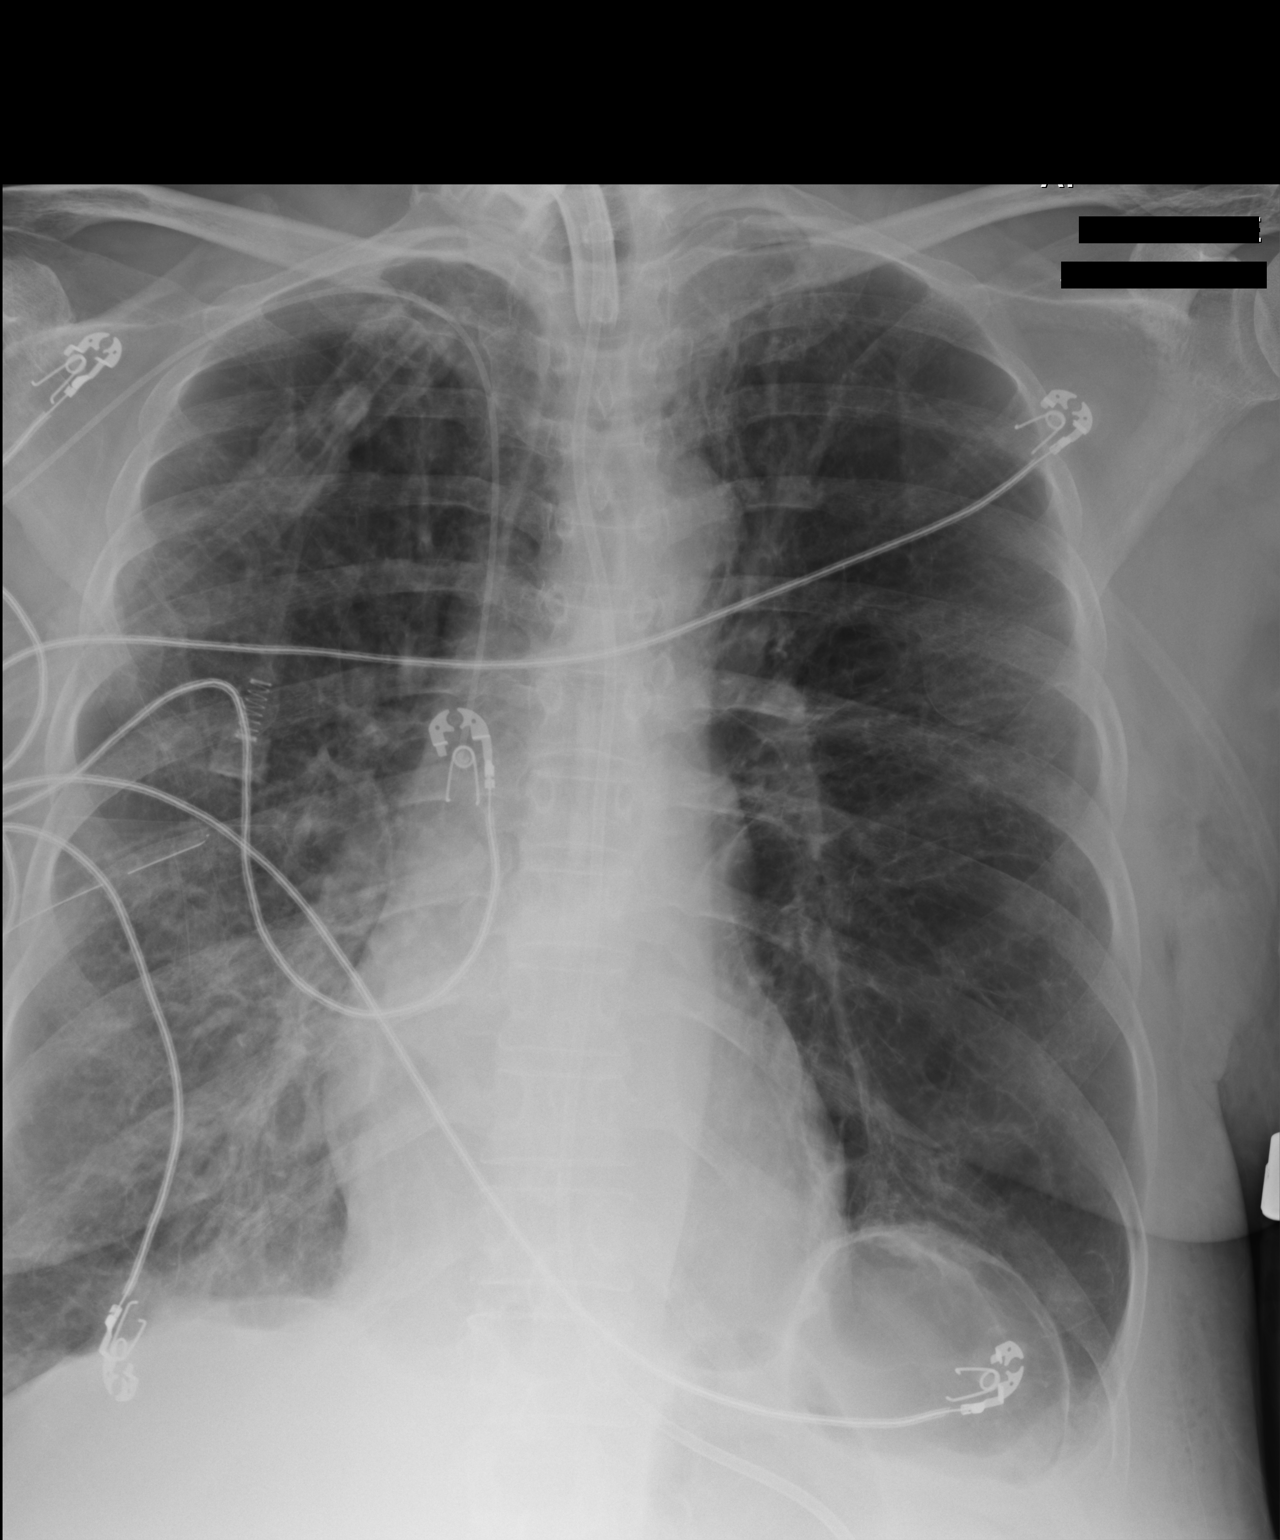

[2 of 2 positions shown; findings below may reference images not displayed]

FINDINGS: Tracheostomy tube, feeding tube and PICC appear in good position.
Right chest tube is in place. No pneumothorax.

The lungs are hyperinflated consistent with emphysema. There is new
pulmonary vascular congestion and interstitial accentuation at the
right lung base which may represent focal pulmonary edema. No other
change.
IMPRESSION: Increasing pulmonary vascular congestion at the right lung base
which may represent focal pulmonary edema superimposed on severe
COPD. No pneumothorax.

## 2015-05-27 IMAGING — CR DG CHEST 1V PORT
2 series · 2 of 2 positions shown · non-contrast
Comparison: 08/09/2014

CLINICAL DATA: Tracheostomy. Ventilator dependence. Respiratory
failure.

EXAM:
PORTABLE CHEST - 1 VIEW

[AP (1 of 2)]
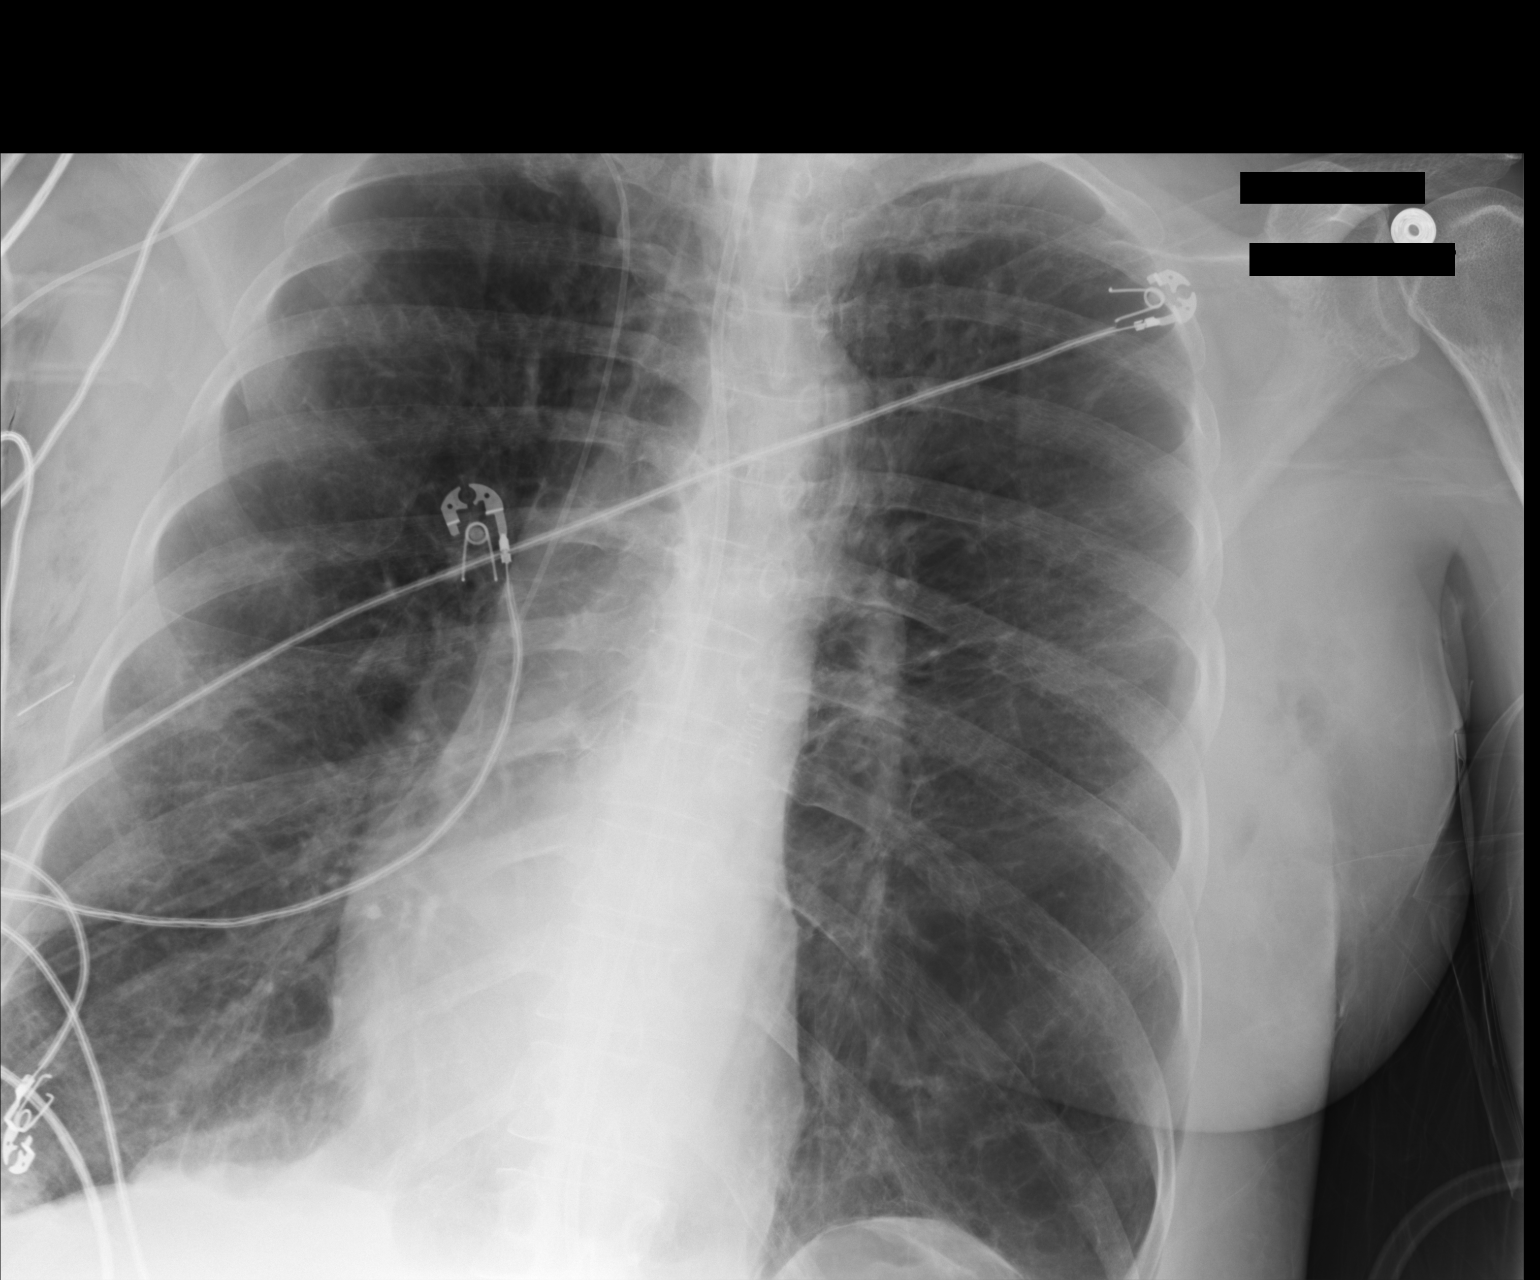

[AP (2 of 2)]
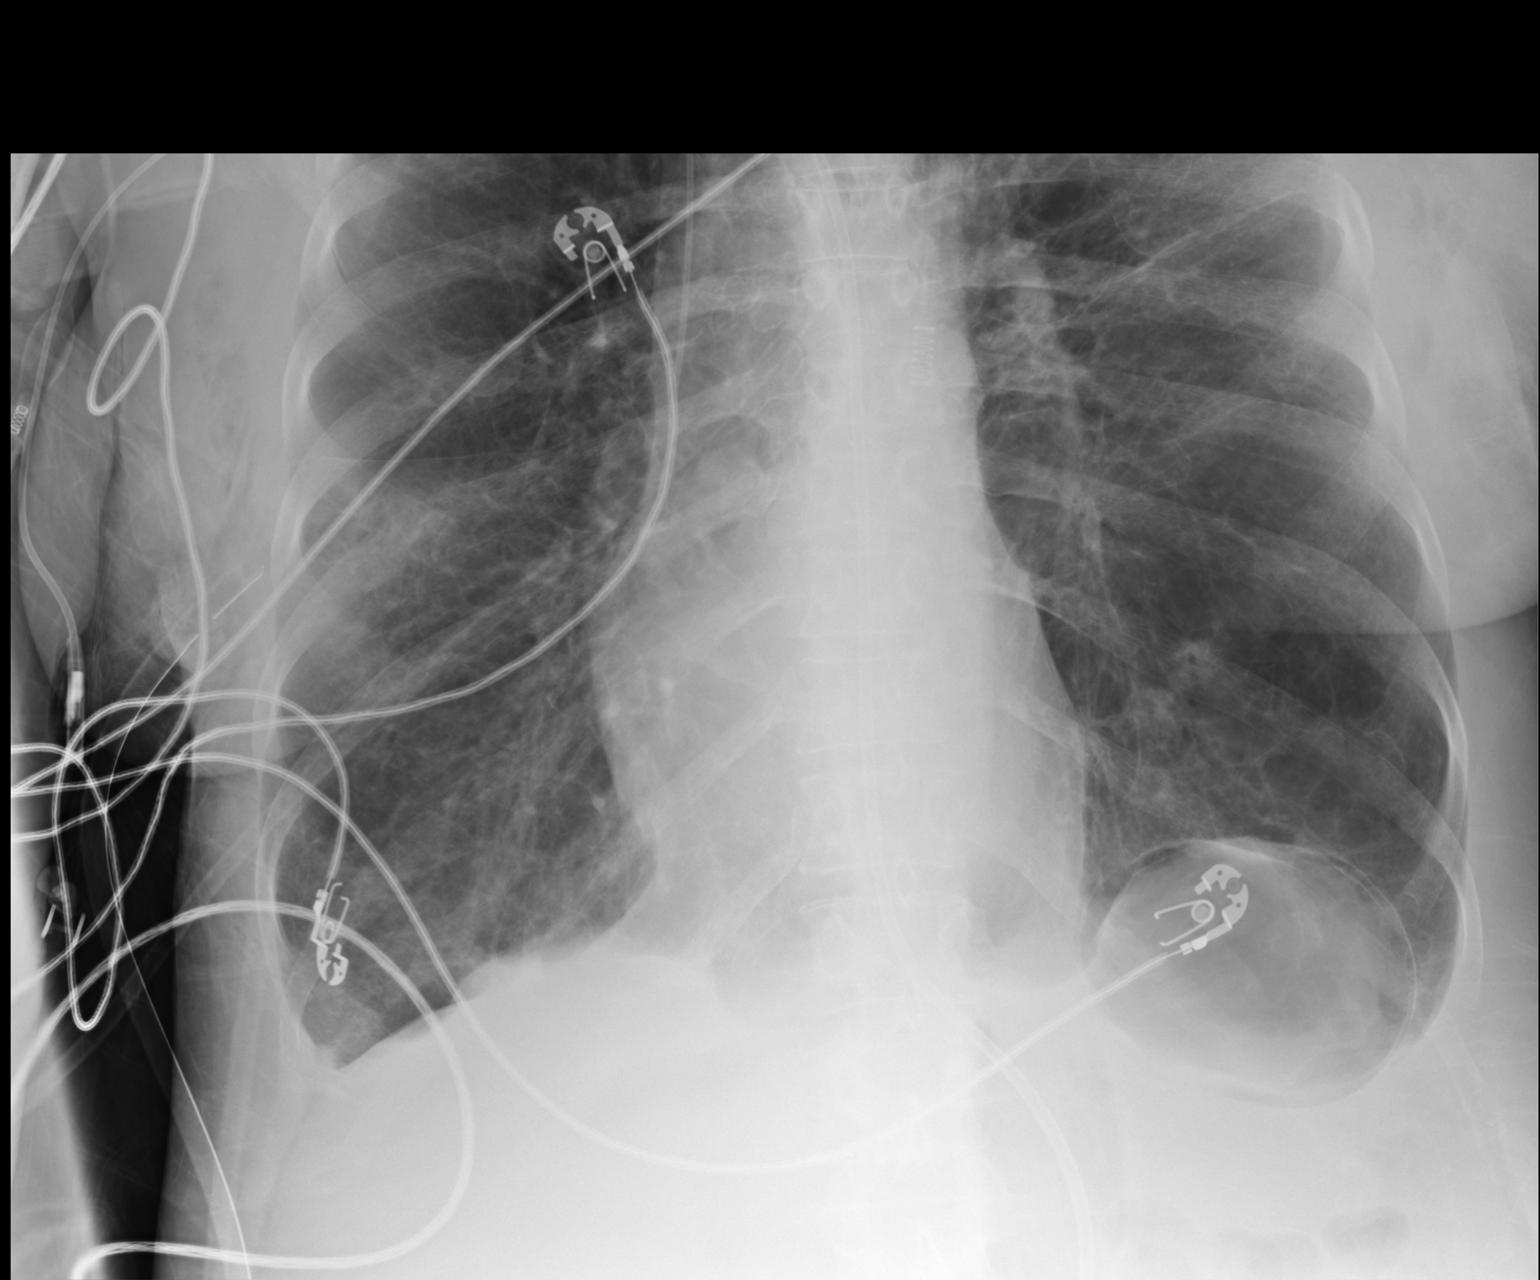

[2 of 2 positions shown; findings below may reference images not displayed]

FINDINGS: Tracheostomy tube projects over the tracheal air shadow. Feeding
tube extends into the stomach. Right-sided PICC line tip: SVC.

Severe emphysema. Improvement in prior abnormal interstitial
accentuation, although not resolved. The right chest tube appears to
of back CT into the soft tissues of the right chest wall, with no
portion of the tube within the thoracic cage. Subtle airspace
opacity in the right mid lung. I do not observe a pneumothorax.

Left basilar bulla versus unusual projection of gas in bowel below
the left hemidiaphragm.
IMPRESSION: 1. The right chest tube has backed out into the soft tissues of the
right chest wall, with no portion of the tube within the thoracic
cage. No visible pneumothorax.
2. Improved interstitial prominence. Faint residual airspace opacity
in the right mid lung.
3. Severe emphysema.

These results will be called to the ordering clinician or
representative by the Radiologist Assistant, and communication
documented in the PACS or zVision Dashboard.

## 2015-05-28 IMAGING — CR DG CHEST 1V PORT
2 series · 2 of 2 positions shown · non-contrast
Comparison: Single view of the chest 08/10/2014 and 08/09/2014.

CLINICAL DATA: Respiratory failure.  COPD.

EXAM:
PORTABLE CHEST - 1 VIEW

[AP (1 of 2)]
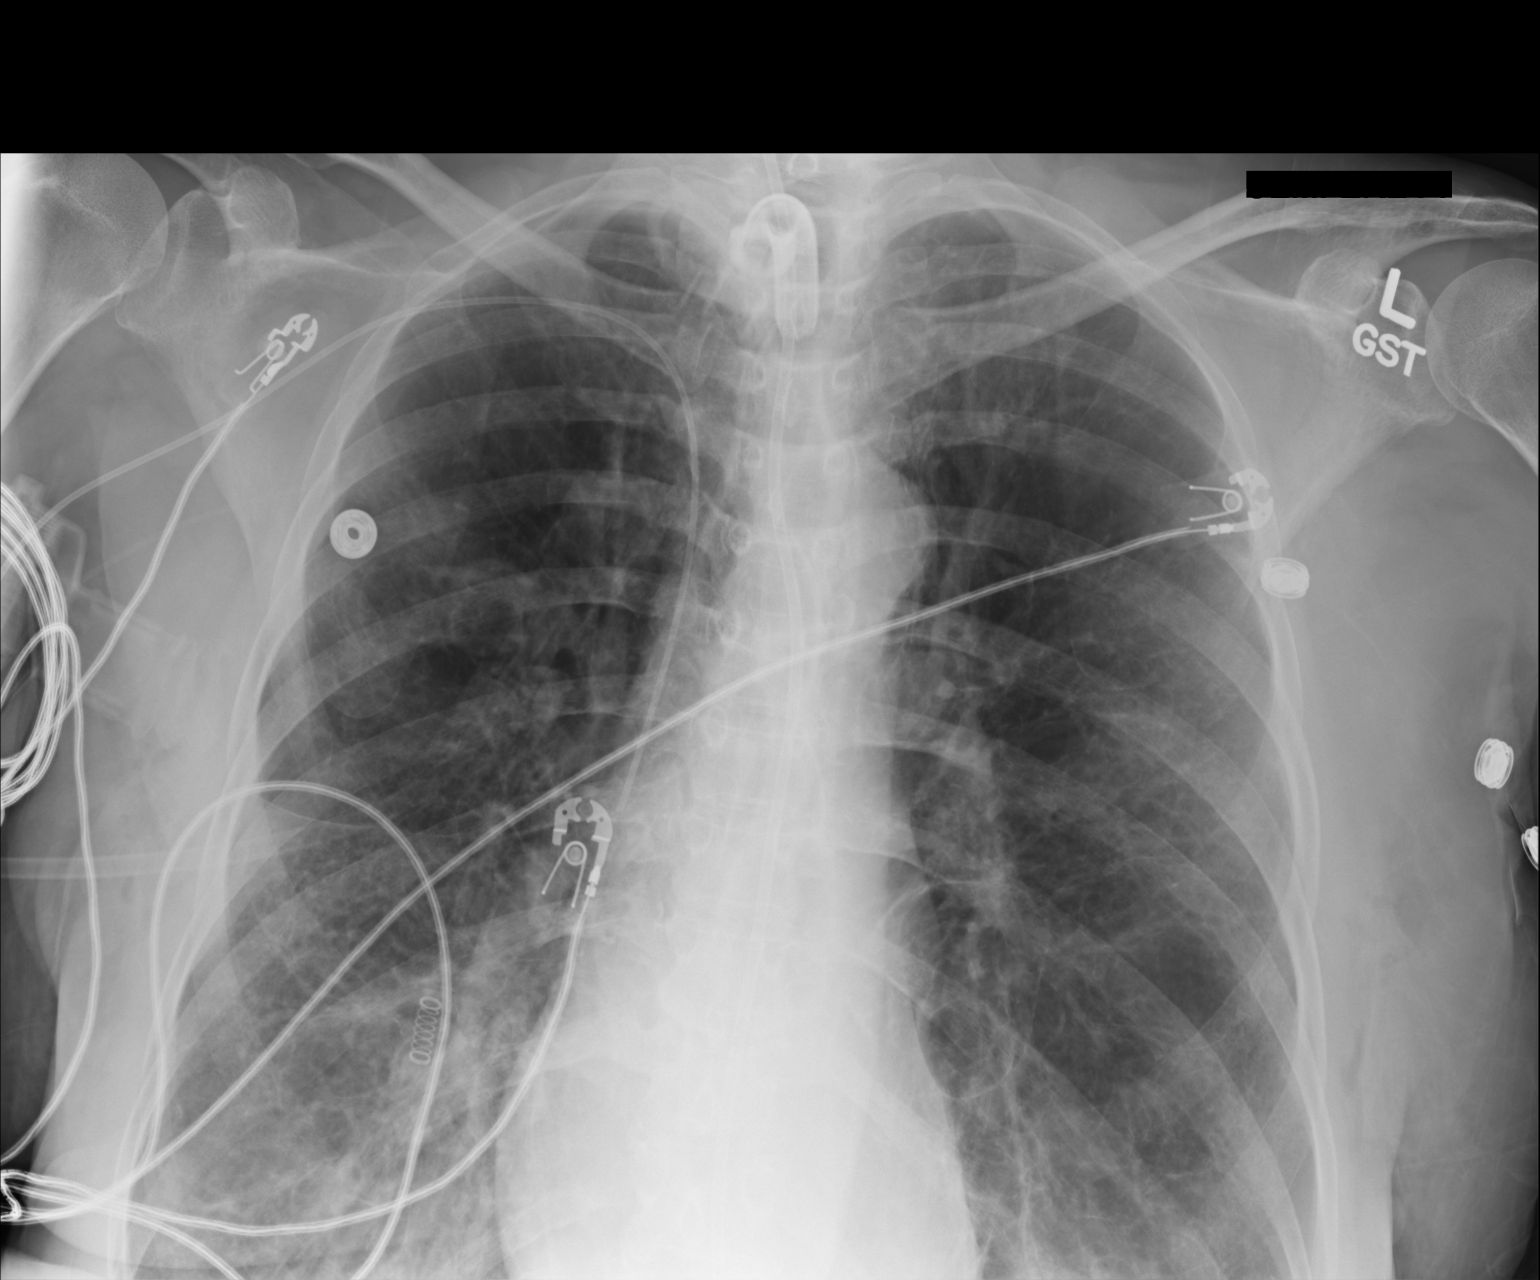

[AP (2 of 2)]
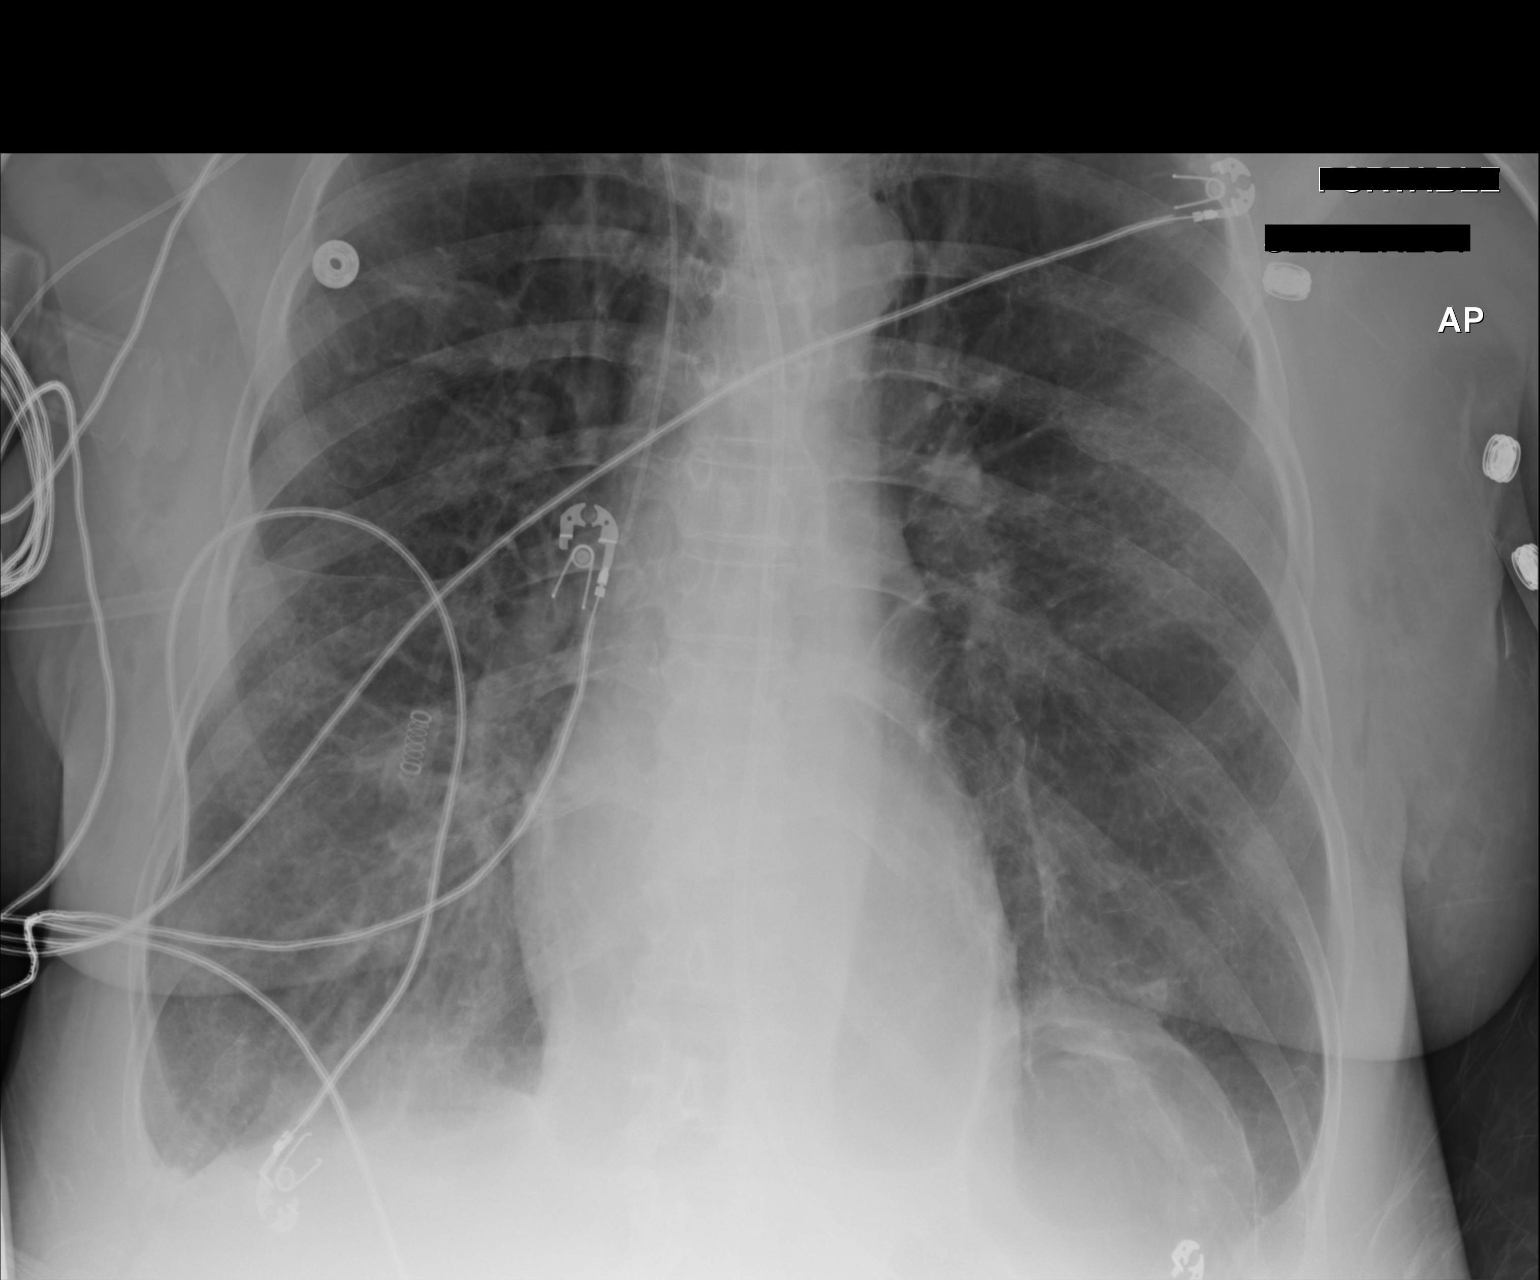

[2 of 2 positions shown; findings below may reference images not displayed]

FINDINGS: Right chest tube seen on yesterday's examination has been removed.
No pneumothorax identified. Support tubes and lines are otherwise
unchanged. The lungs are severely emphysematous. No consolidative
process, pneumothorax or effusion is identified. Heart size is
normal.
IMPRESSION: Right chest tube has been removed.  Negative for pneumothorax.

Severe emphysema.

No acute abnormality.
